# Patient Record
Sex: Female | Born: 1970 | Race: White | Hispanic: No | Marital: Married | Smoking: Current every day smoker
Health system: Southern US, Community
[De-identification: ages and names within clinical notes are randomized; demographics above are authoritative.]

## PROBLEM LIST (undated history)

## (undated) DIAGNOSIS — M25551 Pain in right hip: Secondary | ICD-10-CM

## (undated) DIAGNOSIS — E119 Type 2 diabetes mellitus without complications: Secondary | ICD-10-CM

## (undated) DIAGNOSIS — F32A Depression, unspecified: Secondary | ICD-10-CM

## (undated) DIAGNOSIS — N3090 Cystitis, unspecified without hematuria: Secondary | ICD-10-CM

## (undated) DIAGNOSIS — M1990 Unspecified osteoarthritis, unspecified site: Secondary | ICD-10-CM

## (undated) DIAGNOSIS — Z8719 Personal history of other diseases of the digestive system: Secondary | ICD-10-CM

## (undated) DIAGNOSIS — IMO0002 Reserved for concepts with insufficient information to code with codable children: Secondary | ICD-10-CM

## (undated) DIAGNOSIS — K760 Fatty (change of) liver, not elsewhere classified: Secondary | ICD-10-CM

## (undated) DIAGNOSIS — F419 Anxiety disorder, unspecified: Secondary | ICD-10-CM

## (undated) DIAGNOSIS — N189 Chronic kidney disease, unspecified: Secondary | ICD-10-CM

## (undated) DIAGNOSIS — G709 Myoneural disorder, unspecified: Secondary | ICD-10-CM

## (undated) DIAGNOSIS — G4700 Insomnia, unspecified: Secondary | ICD-10-CM

## (undated) DIAGNOSIS — R519 Headache, unspecified: Secondary | ICD-10-CM

## (undated) DIAGNOSIS — K3184 Gastroparesis: Secondary | ICD-10-CM

## (undated) DIAGNOSIS — G8929 Other chronic pain: Secondary | ICD-10-CM

## (undated) DIAGNOSIS — R110 Nausea: Secondary | ICD-10-CM

## (undated) DIAGNOSIS — E2740 Unspecified adrenocortical insufficiency: Secondary | ICD-10-CM

## (undated) DIAGNOSIS — B9681 Helicobacter pylori [H. pylori] as the cause of diseases classified elsewhere: Secondary | ICD-10-CM

## (undated) DIAGNOSIS — F329 Major depressive disorder, single episode, unspecified: Secondary | ICD-10-CM

## (undated) DIAGNOSIS — R51 Headache: Secondary | ICD-10-CM

## (undated) DIAGNOSIS — F418 Other specified anxiety disorders: Secondary | ICD-10-CM

## (undated) DIAGNOSIS — E1165 Type 2 diabetes mellitus with hyperglycemia: Secondary | ICD-10-CM

## (undated) DIAGNOSIS — J189 Pneumonia, unspecified organism: Secondary | ICD-10-CM

## (undated) DIAGNOSIS — K602 Anal fissure, unspecified: Secondary | ICD-10-CM

## (undated) DIAGNOSIS — M797 Fibromyalgia: Secondary | ICD-10-CM

## (undated) DIAGNOSIS — E785 Hyperlipidemia, unspecified: Secondary | ICD-10-CM

## (undated) DIAGNOSIS — I10 Essential (primary) hypertension: Secondary | ICD-10-CM

## (undated) DIAGNOSIS — K279 Peptic ulcer, site unspecified, unspecified as acute or chronic, without hemorrhage or perforation: Secondary | ICD-10-CM

## (undated) DIAGNOSIS — K8020 Calculus of gallbladder without cholecystitis without obstruction: Secondary | ICD-10-CM

## (undated) HISTORY — DX: Depression, unspecified: F32.A

## (undated) HISTORY — DX: Gastroparesis: K31.84

## (undated) HISTORY — DX: Other specified anxiety disorders: F41.8

## (undated) HISTORY — DX: Headache: R51

## (undated) HISTORY — DX: Anal fissure, unspecified: K60.2

## (undated) HISTORY — DX: Pain in right hip: M25.551

## (undated) HISTORY — DX: Reserved for concepts with insufficient information to code with codable children: IMO0002

## (undated) HISTORY — DX: Unspecified adrenocortical insufficiency: E27.40

## (undated) HISTORY — DX: Anxiety disorder, unspecified: F41.9

## (undated) HISTORY — DX: Helicobacter pylori (H. pylori) as the cause of diseases classified elsewhere: B96.81

## (undated) HISTORY — DX: Type 2 diabetes mellitus without complications: E11.9

## (undated) HISTORY — DX: Type 2 diabetes mellitus with hyperglycemia: E11.65

## (undated) HISTORY — PX: TUBAL LIGATION: SHX77

## (undated) HISTORY — DX: Major depressive disorder, single episode, unspecified: F32.9

## (undated) HISTORY — DX: Unspecified osteoarthritis, unspecified site: M19.90

## (undated) HISTORY — DX: Hyperlipidemia, unspecified: E78.5

## (undated) HISTORY — DX: Headache, unspecified: R51.9

## (undated) HISTORY — DX: Essential (primary) hypertension: I10

## (undated) HISTORY — DX: Other chronic pain: G89.29

## (undated) HISTORY — DX: Nausea: R11.0

## (undated) HISTORY — DX: Chronic kidney disease, unspecified: N18.9

## (undated) HISTORY — DX: Helicobacter pylori (H. pylori) as the cause of diseases classified elsewhere: K27.9

---

## 1998-01-31 ENCOUNTER — Ambulatory Visit (HOSPITAL_COMMUNITY): Admission: RE | Admit: 1998-01-31 | Discharge: 1998-01-31 | Payer: Self-pay | Admitting: Obstetrics and Gynecology

## 2003-01-11 ENCOUNTER — Other Ambulatory Visit: Admission: RE | Admit: 2003-01-11 | Discharge: 2003-01-11 | Payer: Self-pay | Admitting: Obstetrics and Gynecology

## 2003-01-30 ENCOUNTER — Encounter: Payer: Self-pay | Admitting: Obstetrics and Gynecology

## 2003-01-30 ENCOUNTER — Ambulatory Visit (HOSPITAL_COMMUNITY): Admission: RE | Admit: 2003-01-30 | Discharge: 2003-01-30 | Payer: Self-pay | Admitting: Obstetrics and Gynecology

## 2003-02-25 ENCOUNTER — Encounter: Admission: RE | Admit: 2003-02-25 | Discharge: 2003-05-26 | Payer: Self-pay | Admitting: Family Medicine

## 2003-12-09 ENCOUNTER — Encounter: Admission: RE | Admit: 2003-12-09 | Discharge: 2004-03-08 | Payer: Self-pay | Admitting: Family Medicine

## 2005-01-01 ENCOUNTER — Ambulatory Visit: Payer: Self-pay | Admitting: Internal Medicine

## 2005-01-14 ENCOUNTER — Ambulatory Visit: Payer: Self-pay | Admitting: Internal Medicine

## 2005-01-20 ENCOUNTER — Ambulatory Visit: Payer: Self-pay | Admitting: Family Medicine

## 2005-01-21 ENCOUNTER — Ambulatory Visit: Payer: Self-pay | Admitting: *Deleted

## 2005-01-28 ENCOUNTER — Ambulatory Visit (HOSPITAL_COMMUNITY): Admission: RE | Admit: 2005-01-28 | Discharge: 2005-01-28 | Payer: Self-pay | Admitting: Family Medicine

## 2005-01-28 ENCOUNTER — Ambulatory Visit: Payer: Self-pay | Admitting: Family Medicine

## 2005-02-12 ENCOUNTER — Ambulatory Visit: Payer: Self-pay | Admitting: Internal Medicine

## 2005-03-31 ENCOUNTER — Ambulatory Visit: Payer: Self-pay | Admitting: Internal Medicine

## 2005-05-13 ENCOUNTER — Ambulatory Visit: Payer: Self-pay | Admitting: Internal Medicine

## 2008-07-26 HISTORY — PX: DIAGNOSTIC LAPAROSCOPY: SUR761

## 2009-09-13 ENCOUNTER — Encounter: Payer: Self-pay | Admitting: Sports Medicine

## 2009-09-25 ENCOUNTER — Encounter: Admission: RE | Admit: 2009-09-25 | Discharge: 2009-09-25 | Payer: Self-pay | Admitting: Family Medicine

## 2010-01-13 ENCOUNTER — Ambulatory Visit (HOSPITAL_COMMUNITY): Admission: RE | Admit: 2010-01-13 | Discharge: 2010-01-14 | Payer: Self-pay | Admitting: Obstetrics and Gynecology

## 2010-01-13 ENCOUNTER — Encounter (INDEPENDENT_AMBULATORY_CARE_PROVIDER_SITE_OTHER): Payer: Self-pay | Admitting: Obstetrics and Gynecology

## 2010-07-26 HISTORY — PX: ABDOMINAL HYSTERECTOMY: SHX81

## 2010-08-27 NOTE — Progress Notes (Signed)
Summary: [facility]   Imported By: Marily Memos 09/18/2009 14:54:51  _____________________________________________________________________  External Attachment:    Type:   Image     Comment:   External Document

## 2010-10-11 LAB — URINE MICROSCOPIC-ADD ON

## 2010-10-11 LAB — CBC
HCT: 37.5 % (ref 36.0–46.0)
Hemoglobin: 10.5 g/dL — ABNORMAL LOW (ref 12.0–15.0)
Hemoglobin: 12.9 g/dL (ref 12.0–15.0)
MCHC: 34.4 g/dL (ref 30.0–36.0)
MCHC: 34.6 g/dL (ref 30.0–36.0)
MCV: 83.4 fL (ref 78.0–100.0)
RBC: 3.64 MIL/uL — ABNORMAL LOW (ref 3.87–5.11)
RDW: 15.2 % (ref 11.5–15.5)

## 2010-10-11 LAB — BASIC METABOLIC PANEL
CO2: 25 mEq/L (ref 19–32)
Calcium: 8.8 mg/dL (ref 8.4–10.5)
Glucose, Bld: 306 mg/dL — ABNORMAL HIGH (ref 70–99)
Potassium: 3.4 mEq/L — ABNORMAL LOW (ref 3.5–5.1)
Sodium: 134 mEq/L — ABNORMAL LOW (ref 135–145)

## 2010-10-11 LAB — URINALYSIS, ROUTINE W REFLEX MICROSCOPIC
Bilirubin Urine: NEGATIVE
Nitrite: NEGATIVE
Specific Gravity, Urine: 1.015 (ref 1.005–1.030)
Urobilinogen, UA: 0.2 mg/dL (ref 0.0–1.0)

## 2010-10-11 LAB — GLUCOSE, CAPILLARY
Glucose-Capillary: 184 mg/dL — ABNORMAL HIGH (ref 70–99)
Glucose-Capillary: 254 mg/dL — ABNORMAL HIGH (ref 70–99)

## 2011-07-27 HISTORY — PX: GALLBLADDER SURGERY: SHX652

## 2011-08-02 ENCOUNTER — Ambulatory Visit (INDEPENDENT_AMBULATORY_CARE_PROVIDER_SITE_OTHER): Payer: Managed Care, Other (non HMO)

## 2011-08-02 DIAGNOSIS — R05 Cough: Secondary | ICD-10-CM

## 2011-08-02 DIAGNOSIS — J069 Acute upper respiratory infection, unspecified: Secondary | ICD-10-CM

## 2011-08-02 DIAGNOSIS — R059 Cough, unspecified: Secondary | ICD-10-CM

## 2011-09-05 ENCOUNTER — Ambulatory Visit (INDEPENDENT_AMBULATORY_CARE_PROVIDER_SITE_OTHER): Payer: Managed Care, Other (non HMO) | Admitting: Physician Assistant

## 2011-09-05 ENCOUNTER — Encounter: Payer: Self-pay | Admitting: Physician Assistant

## 2011-09-05 DIAGNOSIS — M25551 Pain in right hip: Secondary | ICD-10-CM | POA: Insufficient documentation

## 2011-09-05 DIAGNOSIS — F411 Generalized anxiety disorder: Secondary | ICD-10-CM

## 2011-09-05 DIAGNOSIS — M79676 Pain in unspecified toe(s): Secondary | ICD-10-CM

## 2011-09-05 DIAGNOSIS — E1165 Type 2 diabetes mellitus with hyperglycemia: Secondary | ICD-10-CM

## 2011-09-05 DIAGNOSIS — IMO0002 Reserved for concepts with insufficient information to code with codable children: Secondary | ICD-10-CM | POA: Insufficient documentation

## 2011-09-05 DIAGNOSIS — M79609 Pain in unspecified limb: Secondary | ICD-10-CM

## 2011-09-05 DIAGNOSIS — IMO0001 Reserved for inherently not codable concepts without codable children: Secondary | ICD-10-CM

## 2011-09-05 DIAGNOSIS — F418 Other specified anxiety disorders: Secondary | ICD-10-CM

## 2011-09-05 LAB — COMPREHENSIVE METABOLIC PANEL
ALT: 10 U/L (ref 0–35)
AST: 14 U/L (ref 0–37)
CO2: 24 mEq/L (ref 19–32)
Creat: 0.57 mg/dL (ref 0.50–1.10)
Total Bilirubin: 0.5 mg/dL (ref 0.3–1.2)

## 2011-09-05 LAB — LIPID PANEL
HDL: 47 mg/dL (ref >39)
LDL Cholesterol: 107 mg/dL — ABNORMAL HIGH (ref 0–99)
Triglycerides: 146 mg/dL (ref <150)
VLDL: 29 mg/dL (ref 0–40)

## 2011-09-05 MED ORDER — DESVENLAFAXINE SUCCINATE ER 100 MG PO TB24: 100.0000 mg | ORAL_TABLET | Freq: Every day | ORAL | Status: DC

## 2011-09-05 MED ORDER — DICLOFENAC SODIUM 75 MG PO TBEC: 75.0000 mg | DELAYED_RELEASE_TABLET | Freq: Two times a day (BID) | ORAL | Status: DC

## 2011-09-05 MED ORDER — CLONAZEPAM 0.5 MG PO TABS: ORAL_TABLET | ORAL | Status: DC

## 2011-09-05 MED ORDER — METFORMIN HCL 1000 MG PO TABS: 1000.0000 mg | ORAL_TABLET | Freq: Two times a day (BID) | ORAL | Status: DC

## 2011-09-05 MED ORDER — GLIPIZIDE ER 10 MG PO TB24: 10.0000 mg | ORAL_TABLET | Freq: Every day | ORAL | Status: DC

## 2011-09-05 NOTE — Progress Notes (Signed)
  Subjective:    Patient ID: Meghan Park, female    DOB: [birth year], 41 y.o.   MRN: [id number]  HPI Pt presents with multiple problems     - recheck/med refill for DM.  Tolerating glucophage and glucotrol well.  Has increased exercise slightly as her hip pain has allowed her to, trying to watch her diet - does not regularly check her glucose at home      -R toe pain/strange looking toenail - has noticed it for several months - kicks things a lot at work so ? Whether from trauma but worried about fungus - pain only when she hits her toe      -R hip pain - wakes her up at night - mobic did not help with the pain, motrin barely gives her any relief      -depression and anxiety - she questions whether her control with Prestiq is as good as it has been - She has been on Prestiq 100mg  for about 6 months and felt like she was doing good until a few weeks ago where she is having increased anxiety with some panic types feelings with irritability and sob.  She is the happiest she has been in a long time and feels like she has no other big stressors.  She has also noticed increased in depression with increase crying spells - this increase is not everyday and she ? Whether it is related to insomnia and lack of good sleep  Review of Systems  Constitutional: Negative for appetite change.  Cardiovascular: Negative for chest pain.  Psychiatric/Behavioral: Positive for sleep disturbance. The patient is nervous/anxious.        Objective:   Physical Exam  Constitutional: She appears well-developed and well-nourished.  HENT:  Head: Normocephalic and atraumatic.  Right Ear: External ear normal.  Left Ear: External ear normal.  Cardiovascular: Normal rate and regular rhythm.   Pulmonary/Chest: Effort normal and breath sounds normal.  Skin: Skin is warm and dry. No cyanosis. Nails show no clubbing.             Assessment & Plan:   1. DM (diabetes mellitus), type 2, uncontrolled  POCT glycosylated  hemoglobin (Hb A1C), POCT glucose (manual entry), Lipid panel, Comprehensive metabolic panel, glipiZIDE (GLUCOTROL XL) 10 MG 24 hr tablet, metFORMIN (GLUCOPHAGE) 1000 MG tablet  2. Depression with anxiety  desvenlafaxine (PRISTIQ) 100 MG 24 hr tablet, clonazePAM (KLONOPIN) 0.5 MG tablet  3. Toe pain    4. Hip pain, right  diclofenac (VOLTAREN) 75 MG EC tablet     D/w pt following points 1-DM - significant improvement in HgA1C - continue current regimen, monitor diet and increase exercise for weight lose 2-Toe pain - believe related to trauma but pt is to monitor and if the discoloration gets closer to the nail matrix pt may want to consider fungal treatment 3-hip pain - trial of voltaren for pain relief 4-depression with anxiety - ?related to insomnia - trial of Klonopin qd -   Pt to recheck in 3months for DM, 1 month for depression if symptoms not improved with klonopin.

## 2011-09-05 NOTE — Progress Notes (Signed)
  Subjective:    Patient ID: Meghan Park, female    DOB: [birth year], 40 y.o.   MRN: [id number]  HPI    Review of Systems     Objective:   Physical Exam        Assessment & Plan:

## 2011-09-16 ENCOUNTER — Telehealth: Payer: Self-pay

## 2011-09-16 NOTE — Telephone Encounter (Signed)
Pt states she has now been on the Klonopin for two weeks now and is feeling very tearful and no better.  She would like to know if she could maybe try something else or what she needs to do.

## 2011-09-16 NOTE — Telephone Encounter (Signed)
.  [facility] PT WAS TO CALL BACK IF NO BETTER. PLEASE CALL [phone]

## 2011-09-17 NOTE — Telephone Encounter (Signed)
Would you let her know that Meghan Park should address this and that she is here tomorrow?

## 2011-09-18 NOTE — Telephone Encounter (Signed)
Patient notified.  Does not want to try therapy at this point, will try increasing Klonipin and recheck if no improvement.

## 2011-09-18 NOTE — Telephone Encounter (Signed)
Pt can try to Increase Klonopin to 2 pills qd and if that does not help we may need to try and other antidepressant because we cannot increase Pristiq.  If pt interested in counseling that may help her figure out if there is a cause for the change even though she feels better now then she has in a long time.

## 2011-09-27 ENCOUNTER — Ambulatory Visit (INDEPENDENT_AMBULATORY_CARE_PROVIDER_SITE_OTHER): Payer: Managed Care, Other (non HMO)

## 2011-09-28 ENCOUNTER — Ambulatory Visit (INDEPENDENT_AMBULATORY_CARE_PROVIDER_SITE_OTHER): Payer: Managed Care, Other (non HMO) | Admitting: Physician Assistant

## 2011-09-28 VITALS — BP 119/81 | HR 116 | Temp 99.0°F | Resp 16 | Ht 63.0 in | Wt 208.0 lb

## 2011-09-28 DIAGNOSIS — F341 Dysthymic disorder: Secondary | ICD-10-CM

## 2011-09-28 DIAGNOSIS — G4700 Insomnia, unspecified: Secondary | ICD-10-CM

## 2011-09-28 DIAGNOSIS — F418 Other specified anxiety disorders: Secondary | ICD-10-CM

## 2011-09-28 MED ORDER — SERTRALINE HCL 50 MG PO TABS: ORAL_TABLET | ORAL | Status: DC

## 2011-09-28 MED ORDER — CLONAZEPAM 1 MG PO TABS: 1.0000 mg | ORAL_TABLET | Freq: Every evening | ORAL | Status: DC | PRN

## 2011-09-28 MED ORDER — DESVENLAFAXINE SUCCINATE ER 50 MG PO TB24: 100.0000 mg | ORAL_TABLET | Freq: Every day | ORAL | Status: DC

## 2011-09-28 NOTE — Progress Notes (Signed)
  Subjective:    Patient ID: Meghan Park, female    DOB: 1971-03-30, 41 y.o.   MRN: [id number]  HPI  Pt presents to clinic with concerns that her Pristiq is not working well anymore.  She has been on it for >1year and it has been working well until 1 month ago when she started to notice increased anxiety and teary.  She feels like she is the happiest in her life but cries all the time and sometimes for no reasons.  Yesterday she was at work and her computer messed up and she sat and cried, she tried to come to our office last pm and in the lobby and just started to cry and ended up leaving because of a panic attack.  She has always had problems with anxiety but never like this.  The Klonopin has helped with sleep but even with sleep her crying and anxiety has not improved.  She cannot pinpoint what caused the change.  She has been talking with her pastor at church and that helps but she feels like she needs a medication change.  She has been on Lexapro in past and it worked well for 4-5 years and then just stopped working.  Review of Systems  Psychiatric/Behavioral: Positive for sleep disturbance. Negative for suicidal ideas. The patient is nervous/anxious.        Objective:   Physical Exam  Constitutional: She is oriented to person, place, and time. She appears well-developed and well-nourished.  HENT:  Head: Normocephalic and atraumatic.  Right Ear: External ear normal.  Left Ear: External ear normal.  Eyes: Conjunctivae are normal.  Pulmonary/Chest: Effort normal.  Neurological: She is alert and oriented to person, place, and time.  Skin: Skin is warm and dry.  Psychiatric: Her speech is normal and behavior is normal. Judgment and thought content normal. Her mood appears not anxious. She exhibits a depressed mood (tearful).          Assessment & Plan:   1. Depression with anxiety  clonazePAM (KLONOPIN) 1 MG tablet, desvenlafaxine (PRISTIQ) 50 MG 24 hr tablet, sertraline (ZOLOFT)  50 MG tablet  D/w pt that we will taper off Pristiq within 10 day by Pristiq 50mg  1 po qd for 7 days then 1 po qod for the next week.  As she tapers down the Pristiq we will start Zoloft 50mg  1 po qd until off Pristiq then increase to 2 po qd.  Towards the end of her bottle she should call us with her results.  If good will stay with the same dose and recheck 1 month after that.  If some help but not good control can increase to Zoloft 150mg  and recheck 1 month after that.  If no help after 6 weeks of meds consider change to Cymbalta?  Pt understands and agrees with above plan.  Will continue Klonopin for sleep.

## 2011-11-10 ENCOUNTER — Telehealth: Payer: Self-pay

## 2011-11-10 DIAGNOSIS — F418 Other specified anxiety disorders: Secondary | ICD-10-CM

## 2011-11-10 MED ORDER — CLONAZEPAM 1 MG PO TABS: 1.0000 mg | ORAL_TABLET | Freq: Every evening | ORAL | Status: DC | PRN

## 2011-11-10 MED ORDER — SERTRALINE HCL 50 MG PO TABS: ORAL_TABLET | ORAL | Status: DC

## 2011-11-10 NOTE — Telephone Encounter (Signed)
Pt would like to have a refill on Zoloft and Klonopin. Pharmacy:  [facility]

## 2011-11-10 NOTE — Telephone Encounter (Signed)
Called in klonopin and let pt know that both rx's have been taken care of.

## 2011-11-10 NOTE — Telephone Encounter (Signed)
Rx for Zoloft sent to pharmacy.  Please phone in Klonopin as written.

## 2011-11-20 ENCOUNTER — Ambulatory Visit (INDEPENDENT_AMBULATORY_CARE_PROVIDER_SITE_OTHER): Payer: Managed Care, Other (non HMO) | Admitting: Family Medicine

## 2011-11-20 VITALS — BP 113/78 | HR 103 | Temp 98.4°F | Resp 20 | Ht 63.0 in | Wt 218.0 lb

## 2011-11-20 DIAGNOSIS — J329 Chronic sinusitis, unspecified: Secondary | ICD-10-CM

## 2011-11-20 DIAGNOSIS — R609 Edema, unspecified: Secondary | ICD-10-CM

## 2011-11-20 DIAGNOSIS — J45909 Unspecified asthma, uncomplicated: Secondary | ICD-10-CM

## 2011-11-20 DIAGNOSIS — E119 Type 2 diabetes mellitus without complications: Secondary | ICD-10-CM

## 2011-11-20 DIAGNOSIS — R600 Localized edema: Secondary | ICD-10-CM

## 2011-11-20 MED ORDER — MOMETASONE FURO-FORMOTEROL FUM 200-5 MCG/ACT IN AERO: 1.0000 | INHALATION_SPRAY | Freq: Two times a day (BID) | RESPIRATORY_TRACT | Status: DC

## 2011-11-20 MED ORDER — AMOXICILLIN 875 MG PO TABS: 875.0000 mg | ORAL_TABLET | Freq: Two times a day (BID) | ORAL | Status: AC

## 2011-11-20 MED ORDER — FLUCONAZOLE 150 MG PO TABS: 150.0000 mg | ORAL_TABLET | Freq: Once | ORAL | Status: AC

## 2011-11-20 MED ORDER — FLUTICASONE PROPIONATE 50 MCG/ACT NA SUSP: 2.0000 | Freq: Every day | NASAL | Status: DC

## 2011-11-20 NOTE — Progress Notes (Signed)
  Subjective:    Patient ID: Meghan Park, female    DOB: 1970-09-28, 41 y.o.   MRN: [id number]  HPI  Patient presents with several concerns.  Complains of facial pressure, PND and nocturnal cough. Experiences chest tightness, DOE without wheezing and again symptoms are worse at night. No history of seasonal allergies or allergy induced asthma.  Headache across forehead and at times associated with nausea.  Recent leg swelling (L) > (R) LE;  No significant increase in Na+; No history of of neuropathy related to DM. Having to seat more at work  PMH / Type 2 DM- Last A1C 7.0.  Tobacco -1/2 pack/ day X 20 years  Review of Systems  Constitutional: Positive for fatigue. Negative for fever and chills.  HENT: Positive for congestion and sneezing.   Eyes: Negative for itching.  Respiratory: Positive for cough and shortness of breath. Negative for wheezing.        Objective:   Physical Exam  Constitutional: She appears well-developed.  HENT:  Nose: Mucosal edema: turbinates with blue/purple hue and very edematous.  Eyes: Conjunctivae are normal.  Neck: Neck supple.  Cardiovascular: Normal rate, regular rhythm and normal heart sounds.   Pulmonary/Chest:       Prolong expiratory phase associated with course BS  Lymphadenopathy:    Cervical adenopathy: shoddy anterior nodes.  Neurological: She is alert.  Skin: Skin is warm.       Trace edema (B) LE; early neuropathic changes          Assessment & Plan:   1. Sinusitis   2. RAD (reactive airway disease)   3. DM type 2 (diabetes mellitus, type 2)   4. Leg edema    See medications on VS. Monitor BS during illness and call with any elevated BS readings. Monitor sodium in diet.

## 2011-12-11 ENCOUNTER — Ambulatory Visit (INDEPENDENT_AMBULATORY_CARE_PROVIDER_SITE_OTHER): Payer: Managed Care, Other (non HMO) | Admitting: Family Medicine

## 2011-12-11 VITALS — BP 123/83 | HR 105 | Temp 98.8°F | Resp 18 | Ht 63.5 in | Wt 213.2 lb

## 2011-12-11 DIAGNOSIS — E1165 Type 2 diabetes mellitus with hyperglycemia: Secondary | ICD-10-CM

## 2011-12-11 DIAGNOSIS — L281 Prurigo nodularis: Secondary | ICD-10-CM

## 2011-12-11 DIAGNOSIS — IMO0002 Reserved for concepts with insufficient information to code with codable children: Secondary | ICD-10-CM

## 2011-12-11 DIAGNOSIS — F418 Other specified anxiety disorders: Secondary | ICD-10-CM

## 2011-12-11 DIAGNOSIS — R17 Unspecified jaundice: Secondary | ICD-10-CM

## 2011-12-11 DIAGNOSIS — F341 Dysthymic disorder: Secondary | ICD-10-CM

## 2011-12-11 DIAGNOSIS — IMO0001 Reserved for inherently not codable concepts without codable children: Secondary | ICD-10-CM

## 2011-12-11 LAB — POCT GLYCOSYLATED HEMOGLOBIN (HGB A1C): Hemoglobin A1C: 7.3

## 2011-12-11 MED ORDER — SERTRALINE HCL 50 MG PO TABS: 100.0000 mg | ORAL_TABLET | Freq: Every day | ORAL | Status: DC

## 2011-12-11 MED ORDER — CLONAZEPAM 1 MG PO TABS: 1.0000 mg | ORAL_TABLET | Freq: Every evening | ORAL | Status: DC | PRN

## 2011-12-11 MED ORDER — SERTRALINE HCL 100 MG PO TABS: 100.0000 mg | ORAL_TABLET | Freq: Every day | ORAL | Status: DC

## 2011-12-11 MED ORDER — METFORMIN HCL 1000 MG PO TABS: 1000.0000 mg | ORAL_TABLET | Freq: Two times a day (BID) | ORAL | Status: DC

## 2011-12-11 NOTE — Progress Notes (Signed)
Patient Name: Meghan Park Date of Birth: [birth year] Medical Record Number: [id number] Gender: female Date of Encounter: 12/11/2011  History of Present Illness:  Meghan Park is a 41 y.o. very pleasant female patient who presents with the following:  Here today for medication refills and also to discuss "sores" on her back which she is concerned about.  She admits that she chronically picks at some areas in her back and behind her left ear.  One of the spots feels a little bit sore today.  She has no fever or other symptoms, and is due for an A1c check today  Patient Active Problem List  Diagnoses  . Anxiety associated with depression  . DM (diabetes mellitus), type 2, uncontrolled  . Hip pain, right   Past Medical History  Diagnosis Date  . DM (diabetes mellitus), type 2, uncontrolled   . Anxiety associated with depression   . Hip pain, right    Past Surgical History  Procedure Date  . Cesarean section     x2  . Abdominal hysterectomy 2012   History  Substance Use Topics  . Smoking status: Current Everyday Smoker -- 0.5 packs/day for 20 years    Types: Cigarettes  . Smokeless tobacco: Not on file  . Alcohol Use: No   Family History  Problem Relation Age of Onset  . Anxiety disorder Mother   . Arthritis Father   . Heart disease Maternal Grandfather    No Known Allergies  Medication list has been reviewed and updated.  Review of Systems: As per HPI- otherwise negative.   Physical Examination: Filed Vitals:   12/11/11 0820  BP: 123/83  Pulse: 105  Temp: 98.8 F (37.1 C)  TempSrc: Oral  Resp: 18  Height: 5' 3.5" (1.613 m)  Weight: 213 lb 3.2 oz (96.707 kg)    Body mass index is 37.17 kg/(m^2).  GEN: WDWN, NAD, Non-toxic, A & O x 3, obese HEENT: Atraumatic, Normocephalic. Neck supple. No masses, No LAD.  TM, oropharynx wnl Ears and Nose: No external deformity. CV: RRR, No M/G/R. No JVD. No thrill. No extra heart sounds. PULM: CTA B, no wheezes,  crackles, rhonchi. No retractions. No resp. distress. No accessory muscle use. EXTR: No c/c/e NEURO Normal gait.  PSYCH: Normally interactive. Conversant. Not depressed or anxious appearing.  Calm demeanor.  There are several "picked" areas on the upper part of her back, and one underneath her right earlobe.  They do not appear to be in infected and are in various stages of picking and healing  Results for orders placed in visit on 12/11/11  POCT GLYCOSYLATED HEMOGLOBIN (HGB A1C)      Component Value Range   Hemoglobin A1C 7.3     Assessment and Plan: 1. DM (diabetes mellitus), type 2, uncontrolled  POCT glycosylated hemoglobin (Hb A1C), metFORMIN (GLUCOPHAGE) 1000 MG tablet  2. Depression with anxiety  clonazePAM (KLONOPIN) 1 MG tablet, sertraline (ZOLOFT) 50 MG tablet  3. Picker's nodules    DM control is ok- keep up medications, exercise, and recheck in 3 or 4 months.  Encouraged her to try and stop picking at her skin, as otherwise she will not heal.  She understands this and is trying.  Reassured her that her sores do not appear to be infected. Refilled her klonopin and zoloft.  We will change her to one zoloft 100mg  daily as she has been taking #2 of the 50mg .  Follow- up if getting worse or not healing.

## 2012-01-10 ENCOUNTER — Other Ambulatory Visit: Payer: Self-pay | Admitting: Physician Assistant

## 2012-02-17 ENCOUNTER — Ambulatory Visit: Payer: Managed Care, Other (non HMO)

## 2012-02-17 ENCOUNTER — Ambulatory Visit (INDEPENDENT_AMBULATORY_CARE_PROVIDER_SITE_OTHER): Payer: Managed Care, Other (non HMO) | Admitting: Family Medicine

## 2012-02-17 VITALS — BP 134/74 | HR 116 | Temp 97.8°F | Resp 16 | Ht 63.5 in | Wt 212.8 lb

## 2012-02-17 DIAGNOSIS — K5900 Constipation, unspecified: Secondary | ICD-10-CM

## 2012-02-17 DIAGNOSIS — M2550 Pain in unspecified joint: Secondary | ICD-10-CM

## 2012-02-17 DIAGNOSIS — M25551 Pain in right hip: Secondary | ICD-10-CM

## 2012-02-17 DIAGNOSIS — M25559 Pain in unspecified hip: Secondary | ICD-10-CM

## 2012-02-17 DIAGNOSIS — E119 Type 2 diabetes mellitus without complications: Secondary | ICD-10-CM

## 2012-02-17 DIAGNOSIS — R197 Diarrhea, unspecified: Secondary | ICD-10-CM

## 2012-02-17 LAB — POCT CBC
HCT, POC: 40.1 % (ref 37.7–47.9)
Hemoglobin: 12.3 g/dL (ref 12.2–16.2)
MCH, POC: 27.2 pg (ref 27–31.2)
MCV: 88.5 fL (ref 80–97)
RBC: 4.53 M/uL (ref 4.04–5.48)
WBC: 9 10*3/uL (ref 4.6–10.2)

## 2012-02-17 LAB — COMPREHENSIVE METABOLIC PANEL
Albumin: 3.9 g/dL (ref 3.5–5.2)
Alkaline Phosphatase: 80 U/L (ref 39–117)
CO2: 24 mEq/L (ref 19–32)
Chloride: 103 mEq/L (ref 96–112)
Glucose, Bld: 146 mg/dL — ABNORMAL HIGH (ref 70–99)
Potassium: 3.8 mEq/L (ref 3.5–5.3)
Sodium: 136 mEq/L (ref 135–145)
Total Protein: 6.9 g/dL (ref 6.0–8.3)

## 2012-02-17 LAB — POCT SEDIMENTATION RATE: POCT SED RATE: 52 mm/hr — AB (ref 0–22)

## 2012-02-17 MED ORDER — DICYCLOMINE HCL 20 MG PO TABS: 20.0000 mg | ORAL_TABLET | Freq: Four times a day (QID) | ORAL | Status: DC

## 2012-02-17 MED ORDER — DICLOFENAC SODIUM 75 MG PO TBEC: 75.0000 mg | DELAYED_RELEASE_TABLET | Freq: Two times a day (BID) | ORAL | Status: DC

## 2012-02-17 NOTE — Progress Notes (Signed)
[facility] [address] [place] [address] [phone]  Date:  02/17/2012   Name:  Meghan Park   DOB:  1971/03/05   MRN:  [id number]  PCP:  No primary provider on file.    Chief Complaint: Abdominal Pain and Arthritis   History of Present Illness:  Meghan Park is a 41 y.o. very pleasant female patient who presents with the following:  "It's my stomach.  I've been ignoring it and I can't ignore it any more."   She has noted intermittent constipation and then diarrhea. She had this problem prior to her hysterectomy about 2 years ago- her symptoms then improved, but got worse again about 1 month ago.  She has had a couple of stool accidents.    The constipation will last 1 or 2 days, but she is afraid to treat herself due to the subsequent diarrhea.  She notes nausea, and has had vomited a couple of times.   The nausea is not related to eating; it can come and go.    She has had these symptoms on and off during her life. However, she has now started having stool accidents and has woken at night to have a BM urgently a few times.   Type of food eaten does not seem to matter.    No change in weight.  She noted a little blood on TP a couple of times after constipation episodes- however this seemed due to a fissure.    She has noted more pain in her right hip and in both ankles for the last few months. She is using voltaren once daily for this.  The voltaren helps and she would like more.  She is worried about an autoimmune type disorder  Knows that she has not been keeping up with her exercise and diet as well as she should be.    Patient Active Problem List  Diagnosis  . Anxiety associated with depression  . DM (diabetes mellitus), type 2, uncontrolled  . Hip pain, right    Past Medical History  Diagnosis Date  . DM (diabetes mellitus), type 2, uncontrolled   . Anxiety associated with depression   . Hip pain, right     Past Surgical History    Procedure Date  . Cesarean section     x2  . Abdominal hysterectomy 2012    History  Substance Use Topics  . Smoking status: Current Everyday Smoker -- 0.5 packs/day for 20 years    Types: Cigarettes  . Smokeless tobacco: Not on file  . Alcohol Use: No    Family History  Problem Relation Age of Onset  . Anxiety disorder Mother   . Arthritis Father   . Heart disease Maternal Grandfather     No Known Allergies  Medication list has been reviewed and updated.  Current Outpatient Prescriptions on File Prior to Visit  Medication Sig Dispense Refill  . clonazePAM (KLONOPIN) 1 MG tablet Take 1 tablet (1 mg total) by mouth at bedtime as needed for anxiety. 1/2-1 po qd for anixety  30 tablet  2  . diclofenac (VOLTAREN) 75 MG EC tablet Take 1 tablet (75 mg total) by mouth 2 (two) times daily.  60 tablet  2  . glipiZIDE (GLUCOTROL XL) 10 MG 24 hr tablet TAKE 1 TABLET BY MOUTH DAILY  30 tablet  1  . metFORMIN (GLUCOPHAGE) 1000 MG tablet Take 1 tablet (1,000 mg total) by mouth 2 (two) times daily  with a meal.  60 tablet  6  . sertraline (ZOLOFT) 100 MG tablet Take 1 tablet (100 mg total) by mouth daily.  30 tablet  11  . desvenlafaxine (PRISTIQ) 50 MG 24 hr tablet Take 2 tablets (100 mg total) by mouth daily.  14 tablet  0  . fluticasone (FLONASE) 50 MCG/ACT nasal spray Place 2 sprays into the nose daily.  1 g  6  . Mometasone Furo-Formoterol Fum 200-5 MCG/ACT AERO Inhale 1 puff into the lungs 2 (two) times daily.  1 Inhaler  1    Review of Systems:  As per HPI- otherwise negative.   Physical Examination: Filed Vitals:   02/17/12 0743  BP: 134/74  Pulse: 116  Temp: 97.8 F (36.6 C)  Resp: 16   Filed Vitals:   02/17/12 0743  Height: 5' 3.5" (1.613 m)  Weight: 212 lb 12.8 oz (96.525 kg)   Body mass index is 37.10 kg/(m^2). Ideal Body Weight: Weight in (lb) to have BMI = 25: 143.1   GEN: WDWN, NAD, Non-toxic, A & O x 3, obese HEENT: Atraumatic, Normocephalic. Neck supple.  No masses, No LAD.  Tm and oropharynx wnl Ears and Nose: No external deformity. CV: RRR, No M/G/R. No JVD. No thrill. No extra heart sounds. PULM: CTA B, no wheezes, crackles, rhonchi. No retractions. No resp. distress. No accessory muscle use. ABD: S, NT, ND, +BS. No rebound. No HSM. The chronic pickers nodules on her back look better- she has been better at leaving them alone EXTR: No c/c/e. Bilateral ankles normal, right hip with normal ROM and no acute tenderness NEURO Normal gait.  PSYCH: Normally interactive. Conversant. Not depressed or anxious appearing.  Calm demeanor.  Rectal exam: normal, no stool palpated, no blood visible  [facility] reading (PRIMARY) by  Dr. Patsy Lager. Abdomen:  Normal series Hip, right: mild degenerative change  ABDOMEN - 2 VIEW  Comparison: None.  Findings: No free air underneath the hemidiaphragms. Normal bowel gas pattern. No dilated loops of large or small bowel. Stool and bowel gas extends to the rectosigmoid.  IMPRESSION: Normal two-view abdomen.  RIGHT HIP - COMPLETE 2+ VIEW  Comparison: None.  Findings: Right hip joint space preserved. Hip joint spaces appear symmetric. Pelvic rings intact. Calcification is present at the greater trochanter, small calcification is present at the greater trochanter compatible with gluteal calcific tendonitis. There is no fracture. Alignment of the hip joints is anatomic.  IMPRESSION: Calcific tendonitis of the right hip.  Results for orders placed in visit on 02/17/12  POCT CBC      Component Value Range   WBC 9.0  4.6 - 10.2 K/uL   Lymph, poc 2.3  0.6 - 3.4   POC LYMPH PERCENT 26.1  10 - 50 %L   MID (cbc) 0.5  0 - 0.9   POC MID % 6.1  0 - 12 %M   POC Granulocyte 6.1  2 - 6.9   Granulocyte percent 67.8  37 - 80 %G   RBC 4.53  4.04 - 5.48 M/uL   Hemoglobin 12.3  12.2 - 16.2 g/dL   HCT, POC 11.9  14.7 - 47.9 %   MCV 88.5  80 - 97 fL   MCH, POC 27.2  27 - 31.2 pg   MCHC 30.7 (*) 31.8 - 35.4 g/dL    RDW, POC 82.9     Platelet Count, POC 305  142 - 424 K/uL   MPV 7.7  0 - 99.8 fL  POCT GLYCOSYLATED HEMOGLOBIN (  HGB A1C)      Component Value Range   Hemoglobin A1C 8.1    IFOBT (OCCULT BLOOD)      Component Value Range   IFOBT Negative    POCT SEDIMENTATION RATE      Component Value Range   POCT SED RATE 52 (*) 0 - 22 mm/hr     Assessment and Plan: 1. Constipation  POCT CBC, IFOBT POC (occult bld, rslt in office), Comprehensive metabolic panel, DG Abd 2 Views  2. DM (diabetes mellitus)  POCT glycosylated hemoglobin (Hb A1C), DG Hip Complete Right  3. Diarrhea  POCT CBC, IFOBT POC (occult bld, rslt in office), Comprehensive metabolic panel, Ova and parasite screen, dicyclomine (BENTYL) 20 MG tablet  4. Joint pain  POCT SEDIMENTATION RATE, ANA  5. Hip pain, right  diclofenac (VOLTAREN) 75 MG EC tablet   Suspect that Meghan Park has IBS.  Will try treating her with bentyl while we await her other labs and stool studies.  If all her labs are negative will plan to have her see GI to rule- out any other problems.  She is aware that her DM is becoming less controlled.  She is nearly maxed out on oral therapy.  Meghan Park wants to try hard to improve her diet and exercise habits and recheck in about 2 months.   Joint pains: suspect due to OA, but await ANA- ESR is elevated.  Also will need to go over details of hip x-ray when going over labs.    Meghan Amsterdam, MD

## 2012-02-18 LAB — ANA: Anti Nuclear Antibody(ANA): NEGATIVE

## 2012-02-22 ENCOUNTER — Telehealth: Payer: Self-pay

## 2012-02-22 NOTE — Telephone Encounter (Signed)
Clarified labs with pt's husband.

## 2012-02-22 NOTE — Telephone Encounter (Signed)
Pt would like for nurse to contact her she is confused on what Dr Patsy Lager said on her voicemail

## 2012-02-24 ENCOUNTER — Encounter: Payer: Self-pay | Admitting: Family Medicine

## 2012-02-24 NOTE — Progress Notes (Signed)
Letter to pt reminding her to do this test

## 2012-02-25 LAB — OVA AND PARASITE SCREEN

## 2012-03-02 ENCOUNTER — Telehealth: Payer: Self-pay

## 2012-03-02 NOTE — Telephone Encounter (Signed)
Dr.Copland, Pt would like to let you know that her cramping in her stomach is really bad pt also is experiencing nausea. Pt would like to know what the next step is. Best# [phone]

## 2012-03-02 NOTE — Telephone Encounter (Signed)
Called and discussed with her- we arranged a GI appt for her today.  She could not make it but rescheduled for next week

## 2012-03-03 LAB — HM DIABETES EYE EXAM

## 2012-03-10 ENCOUNTER — Other Ambulatory Visit: Payer: Self-pay | Admitting: Family Medicine

## 2012-03-10 ENCOUNTER — Other Ambulatory Visit: Payer: Self-pay | Admitting: Gastroenterology

## 2012-03-10 DIAGNOSIS — R110 Nausea: Secondary | ICD-10-CM

## 2012-03-10 DIAGNOSIS — R1011 Right upper quadrant pain: Secondary | ICD-10-CM

## 2012-03-10 MED ORDER — GLIPIZIDE ER 10 MG PO TB24: 10.0000 mg | ORAL_TABLET | Freq: Every day | ORAL | Status: DC

## 2012-03-21 ENCOUNTER — Encounter (HOSPITAL_COMMUNITY)
Admission: RE | Admit: 2012-03-21 | Discharge: 2012-03-21 | Disposition: A | Discharge status: Home / Self Care | Payer: Managed Care, Other (non HMO) | Source: Ambulatory Visit | Attending: Gastroenterology | Admitting: Gastroenterology

## 2012-03-21 ENCOUNTER — Ambulatory Visit (HOSPITAL_COMMUNITY)
Admission: RE | Admit: 2012-03-21 | Discharge: 2012-03-21 | Disposition: A | Discharge status: Home / Self Care | Payer: Managed Care, Other (non HMO) | Source: Ambulatory Visit | Attending: Gastroenterology | Admitting: Gastroenterology

## 2012-03-21 DIAGNOSIS — K8020 Calculus of gallbladder without cholecystitis without obstruction: Secondary | ICD-10-CM | POA: Insufficient documentation

## 2012-03-21 DIAGNOSIS — R1011 Right upper quadrant pain: Secondary | ICD-10-CM

## 2012-03-21 DIAGNOSIS — R110 Nausea: Secondary | ICD-10-CM

## 2012-03-21 DIAGNOSIS — K7689 Other specified diseases of liver: Secondary | ICD-10-CM | POA: Insufficient documentation

## 2012-03-28 ENCOUNTER — Encounter (INDEPENDENT_AMBULATORY_CARE_PROVIDER_SITE_OTHER): Payer: Self-pay | Admitting: General Surgery

## 2012-03-28 ENCOUNTER — Ambulatory Visit (INDEPENDENT_AMBULATORY_CARE_PROVIDER_SITE_OTHER): Payer: Managed Care, Other (non HMO) | Admitting: General Surgery

## 2012-03-28 VITALS — BP 126/80 | HR 116 | Temp 98.0°F | Resp 18 | Ht 62.0 in | Wt 210.0 lb

## 2012-03-28 DIAGNOSIS — K8010 Calculus of gallbladder with chronic cholecystitis without obstruction: Secondary | ICD-10-CM

## 2012-03-28 NOTE — Patient Instructions (Signed)

## 2012-03-28 NOTE — Progress Notes (Signed)
Chief Complaint  Patient presents with  . Pre-op Exam    eval GB    HISTORY: Patient is a 41 year old female with his perineal right upper quadrant and epigastric pain. She has been having on and off for around 2 months. It has increased in frequency and severity her last 3 weeks. She has tried seeing if it  is with specific types of foods but has not been able to tell what makes the pain worse.  The pain episodes last between 15 and 45 minutes. They do get better on their own.  She also has had fecal urgency and some diarrhea. Dr. Loreta Ave has been working this up. Her father has a history of gallbladder disease and required a cholecystectomy.  Past Medical History  Diagnosis Date  . DM (diabetes mellitus), type 2, uncontrolled   . Anxiety associated with depression   . Hip pain, right     Past Surgical History  Procedure Date  . Cesarean section     x2  . Abdominal hysterectomy 2012    Current Outpatient Prescriptions  Medication Sig Dispense Refill  . clonazePAM (KLONOPIN) 1 MG tablet TAKE 1 TABLET BY MOUTH AT BEDTIME AS NEEDED FOR ANXIETY. TAKE  1/2 TO 1 TABLET EVERY DAY AS NEEDED FOR ANIXETY  30 tablet  0  . diclofenac (VOLTAREN) 75 MG EC tablet Take 1 tablet (75 mg total) by mouth 2 (two) times daily.  60 tablet  3  . dicyclomine (BENTYL) 20 MG tablet Take 1 tablet (20 mg total) by mouth every 6 (six) hours.  90 tablet  2  . glipiZIDE (GLUCOTROL XL) 10 MG 24 hr tablet Take 1 tablet (10 mg total) by mouth daily.  30 tablet  1  . glucose blood test strip 1 each by Other route as needed. Use as instructed      . metFORMIN (GLUCOPHAGE) 1000 MG tablet Take 1 tablet (1,000 mg total) by mouth 2 (two) times daily with a meal.  60 tablet  6  . sertraline (ZOLOFT) 100 MG tablet Take 1 tablet (100 mg total) by mouth daily.  30 tablet  11  . desvenlafaxine (PRISTIQ) 50 MG 24 hr tablet Take 2 tablets (100 mg total) by mouth daily.  14 tablet  0  . fluticasone (FLONASE) 50 MCG/ACT nasal spray  Place 2 sprays into the nose daily.  1 g  6  . Mometasone Furo-Formoterol Fum 200-5 MCG/ACT AERO Inhale 1 puff into the lungs 2 (two) times daily.  1 Inhaler  1     No Known Allergies   Family History  Problem Relation Age of Onset  . Anxiety disorder Mother   . Arthritis Father   . Heart disease Maternal Grandfather     History   Social History  . Marital Status: Married    Spouse Name: N/A    Number of Children: N/A  . Years of Education: N/A   Social History Main Topics  . Smoking status: Current Everyday Smoker -- 0.5 packs/day for 20 years    Types: Cigarettes  . Smokeless tobacco: None  . Alcohol Use: No  . Drug Use: No  . Sexually Active:     REVIEW OF SYSTEMS - PERTINENT POSITIVES ONLY: 12 point review of systems negative other than HPI and PMH except for cough, palpitations, abdominal distention, constipation, diarrhea, nausea, vomiting, joint pain, headaches, weakness, rash.    EXAM: Filed Vitals:   03/28/12 1507  BP: 126/80  Pulse: 116  Temp: 98 F (36.7  C)  Resp: 18    Gen:  No acute distress.  Well nourished and well groomed.   Neurological: Alert and oriented to person, place, and time. Coordination normal.  Head: Normocephalic and atraumatic.  Eyes: Conjunctivae are normal. Pupils are equal, round, and reactive to light. No scleral icterus.  Neck: Normal range of motion. Neck supple. No tracheal deviation or thyromegaly present.  Cardiovascular: Normal rate, regular rhythm Respiratory: Effort normal.  No respiratory distress. No chest wall tenderness. GI: Soft. Bowel sounds are normal. The abdomen is soft and nontender.  There is no rebound and no guarding.  Musculoskeletal: Normal range of motion. Extremities are nontender.  Lymphadenopathy: No cervical, preauricular, postauricular or axillary adenopathy is present Skin: Skin is warm and dry. No rash noted. No diaphoresis. No erythema. No pallor. No clubbing, cyanosis, or edema.   Psychiatric:  Normal mood and affect. Behavior is normal. Judgment and thought content normal.    RADIOLOGY RESULTS: See E-Chart or I-Site for most recent results.  Images and reports are reviewed. US IMPRESSION:  Cholelithiasis. Hepatic steatosis. The tail of the pancreas is  not visible.    ASSESSMENT AND PLAN: Chronic cholecystitis with calculus Pt with chronic tenderness and episodic post prandial pain.  Classic gallbladder symptomatology.  Plan lap chole with cholangiogram.    The surgical procedure was described to the patient in detail.  The patient was given Agricultural engineer. .  I discussed the incision type and location, the location of the gallbladder, the anatomy of the bile ducts and arteries, and the typical progression of surgery.  I discussed the possibility of converting to an open operation.  I advised of the risks of bleeding, infection, damage to other structures (such as the bile duct, intestine or liver), bile leak, need for other procedures or surgeries, and post op diarrhea/constipation.  We discussed the risk of blood clot.  We discussed the recovery period and post operative restrictions.  The patient was advised against taking blood thinners the week before surgery.      Counselled to stop smoking.    Maudry Diego MD Surgical Oncology, General and Endocrine Surgery [facility]    Visit Diagnoses: 1. Chronic cholecystitis with calculus     Primary Care Physician: Abbe Amsterdam, MD

## 2012-03-28 NOTE — Assessment & Plan Note (Signed)
Pt with chronic tenderness and episodic post prandial pain.  Classic gallbladder symptomatology.  Plan lap chole with cholangiogram.    The surgical procedure was described to the patient in detail.  The patient was given Agricultural engineer. .  I discussed the incision type and location, the location of the gallbladder, the anatomy of the bile ducts and arteries, and the typical progression of surgery.  I discussed the possibility of converting to an open operation.  I advised of the risks of bleeding, infection, damage to other structures (such as the bile duct, intestine or liver), bile leak, need for other procedures or surgeries, and post op diarrhea/constipation.  We discussed the risk of blood clot.  We discussed the recovery period and post operative restrictions.  The patient was advised against taking blood thinners the week before surgery.

## 2012-03-29 ENCOUNTER — Telehealth (INDEPENDENT_AMBULATORY_CARE_PROVIDER_SITE_OTHER): Payer: Self-pay

## 2012-03-29 NOTE — Telephone Encounter (Signed)
LMOV pt's po appt.

## 2012-04-10 ENCOUNTER — Other Ambulatory Visit: Payer: Self-pay | Admitting: Family Medicine

## 2012-04-10 MED ORDER — CLONAZEPAM 1 MG PO TABS: ORAL_TABLET | ORAL | Status: DC

## 2012-04-13 ENCOUNTER — Telehealth (INDEPENDENT_AMBULATORY_CARE_PROVIDER_SITE_OTHER): Payer: Self-pay

## 2012-04-13 ENCOUNTER — Other Ambulatory Visit (INDEPENDENT_AMBULATORY_CARE_PROVIDER_SITE_OTHER): Payer: Self-pay

## 2012-04-13 ENCOUNTER — Other Ambulatory Visit (INDEPENDENT_AMBULATORY_CARE_PROVIDER_SITE_OTHER): Payer: Self-pay | Admitting: General Surgery

## 2012-04-13 DIAGNOSIS — G8918 Other acute postprocedural pain: Secondary | ICD-10-CM

## 2012-04-13 DIAGNOSIS — K8010 Calculus of gallbladder with chronic cholecystitis without obstruction: Secondary | ICD-10-CM

## 2012-04-13 HISTORY — PX: CHOLECYSTECTOMY: SHX55

## 2012-04-13 MED ORDER — OXYCODONE HCL 5 MG PO TABS: 5.0000 mg | ORAL_TABLET | ORAL | Status: DC | PRN

## 2012-04-13 NOTE — Telephone Encounter (Signed)
Patients husband called in stating the pain medicine that was given at discharge is not helping patients pain. They want to know if something else can be prescribed. I paged Dr. Donell Beers and she okayed straight oxy 5mg  1-2 q 4-6 hrs prn #80 with 0 refills. They were told it was ready at our front desk for pick up.

## 2012-04-17 ENCOUNTER — Other Ambulatory Visit: Payer: Self-pay

## 2012-04-20 ENCOUNTER — Telehealth (INDEPENDENT_AMBULATORY_CARE_PROVIDER_SITE_OTHER): Payer: Self-pay | Admitting: General Surgery

## 2012-04-20 NOTE — Telephone Encounter (Signed)
Pt's husband called for advice on pt's extremely poor appetite.  Discussed offering small amounts frequently.  Discussed sipping fluids.  He understands.

## 2012-04-22 ENCOUNTER — Inpatient Hospital Stay (HOSPITAL_COMMUNITY)
Admission: EM | Admit: 2012-04-22 | Discharge: 2012-04-25 | DRG: 445 | Disposition: A | Discharge status: Home / Self Care | Payer: Managed Care, Other (non HMO) | Attending: General Surgery | Admitting: General Surgery

## 2012-04-22 ENCOUNTER — Emergency Department (HOSPITAL_COMMUNITY): Payer: Managed Care, Other (non HMO)

## 2012-04-22 ENCOUNTER — Inpatient Hospital Stay (HOSPITAL_COMMUNITY): Payer: Managed Care, Other (non HMO)

## 2012-04-22 ENCOUNTER — Encounter (HOSPITAL_COMMUNITY): Payer: Self-pay | Admitting: *Deleted

## 2012-04-22 DIAGNOSIS — K838 Other specified diseases of biliary tract: Principal | ICD-10-CM | POA: Diagnosis present

## 2012-04-22 DIAGNOSIS — K8010 Calculus of gallbladder with chronic cholecystitis without obstruction: Secondary | ICD-10-CM | POA: Diagnosis present

## 2012-04-22 DIAGNOSIS — E1165 Type 2 diabetes mellitus with hyperglycemia: Secondary | ICD-10-CM | POA: Diagnosis present

## 2012-04-22 DIAGNOSIS — IMO0002 Reserved for concepts with insufficient information to code with codable children: Secondary | ICD-10-CM | POA: Diagnosis present

## 2012-04-22 DIAGNOSIS — Y849 Medical procedure, unspecified as the cause of abnormal reaction of the patient, or of later complication, without mention of misadventure at the time of the procedure: Secondary | ICD-10-CM | POA: Diagnosis present

## 2012-04-22 DIAGNOSIS — K668 Other specified disorders of peritoneum: Secondary | ICD-10-CM

## 2012-04-22 DIAGNOSIS — R630 Anorexia: Secondary | ICD-10-CM | POA: Diagnosis present

## 2012-04-22 DIAGNOSIS — F329 Major depressive disorder, single episode, unspecified: Secondary | ICD-10-CM | POA: Diagnosis present

## 2012-04-22 DIAGNOSIS — F411 Generalized anxiety disorder: Secondary | ICD-10-CM | POA: Diagnosis present

## 2012-04-22 DIAGNOSIS — F3289 Other specified depressive episodes: Secondary | ICD-10-CM | POA: Diagnosis present

## 2012-04-22 DIAGNOSIS — K929 Disease of digestive system, unspecified: Secondary | ICD-10-CM | POA: Diagnosis present

## 2012-04-22 DIAGNOSIS — F17200 Nicotine dependence, unspecified, uncomplicated: Secondary | ICD-10-CM | POA: Diagnosis present

## 2012-04-22 DIAGNOSIS — M25559 Pain in unspecified hip: Secondary | ICD-10-CM | POA: Diagnosis present

## 2012-04-22 LAB — CBC WITH DIFFERENTIAL/PLATELET
Basophils Absolute: 0.1 10*3/uL (ref 0.0–0.1)
HCT: 37.4 % (ref 36.0–46.0)
Lymphocytes Relative: 22 % (ref 12–46)
Monocytes Relative: 11 % (ref 3–12)
Neutro Abs: 7 10*3/uL (ref 1.7–7.7)
RDW: 14.5 % (ref 11.5–15.5)
WBC: 10.8 10*3/uL — ABNORMAL HIGH (ref 4.0–10.5)

## 2012-04-22 LAB — GLUCOSE, CAPILLARY

## 2012-04-22 LAB — URINE MICROSCOPIC-ADD ON

## 2012-04-22 LAB — URINALYSIS, ROUTINE W REFLEX MICROSCOPIC
Hgb urine dipstick: NEGATIVE
Specific Gravity, Urine: 1.039 — ABNORMAL HIGH (ref 1.005–1.030)
pH: 6 (ref 5.0–8.0)

## 2012-04-22 LAB — COMPREHENSIVE METABOLIC PANEL
ALT: 14 U/L (ref 0–35)
AST: 26 U/L (ref 0–37)
Albumin: 2.7 g/dL — ABNORMAL LOW (ref 3.5–5.2)
Alkaline Phosphatase: 165 U/L — ABNORMAL HIGH (ref 39–117)
Potassium: 4 mEq/L (ref 3.5–5.1)
Sodium: 129 mEq/L — ABNORMAL LOW (ref 135–145)
Total Protein: 8.3 g/dL (ref 6.0–8.3)

## 2012-04-22 LAB — PROTIME-INR: INR: 1.18 (ref 0.00–1.49)

## 2012-04-22 LAB — AMYLASE, BODY FLUID

## 2012-04-22 MED ORDER — INSULIN REGULAR HUMAN 100 UNIT/ML IJ SOLN: 5.0000 [IU] | Freq: Once | INTRAMUSCULAR | Status: DC

## 2012-04-22 MED ORDER — PROMETHAZINE HCL 25 MG/ML IJ SOLN
12.5000 mg | Freq: Once | INTRAMUSCULAR | Status: AC
  Administered 2012-04-22: 12.5 mg via INTRAVENOUS
  Filled 2012-04-22: qty 1

## 2012-04-22 MED ORDER — DEXTROSE 5 % IV SOLN
2.0000 g | Freq: Four times a day (QID) | INTRAVENOUS | Status: DC
  Administered 2012-04-22: 2 g via INTRAVENOUS
  Filled 2012-04-22 (×2): qty 2

## 2012-04-22 MED ORDER — ONDANSETRON HCL 4 MG/2ML IJ SOLN
4.0000 mg | Freq: Once | INTRAMUSCULAR | Status: AC
  Administered 2012-04-22: 4 mg via INTRAVENOUS
  Filled 2012-04-22: qty 2

## 2012-04-22 MED ORDER — SODIUM CHLORIDE 0.9 % IV BOLUS (SEPSIS)
1000.0000 mL | Freq: Once | INTRAVENOUS | Status: AC
  Administered 2012-04-22: 1000 mL via INTRAVENOUS

## 2012-04-22 MED ORDER — INSULIN ASPART 100 UNIT/ML ~~LOC~~ SOLN
5.0000 [IU] | Freq: Once | SUBCUTANEOUS | Status: AC
  Administered 2012-04-22: 5 [IU] via SUBCUTANEOUS
  Filled 2012-04-22: qty 1

## 2012-04-22 MED ORDER — HYDROMORPHONE HCL PF 1 MG/ML IJ SOLN
1.0000 mg | Freq: Once | INTRAMUSCULAR | Status: AC
  Administered 2012-04-22: 1 mg via INTRAVENOUS
  Filled 2012-04-22: qty 1

## 2012-04-22 MED ORDER — FENTANYL CITRATE 0.05 MG/ML IJ SOLN
INTRAMUSCULAR | Status: AC
  Filled 2012-04-22: qty 6

## 2012-04-22 MED ORDER — INSULIN ASPART 100 UNIT/ML ~~LOC~~ SOLN
0.0000 [IU] | Freq: Four times a day (QID) | SUBCUTANEOUS | Status: DC
  Administered 2012-04-22: 4 [IU] via SUBCUTANEOUS
  Administered 2012-04-22: 11 [IU] via SUBCUTANEOUS
  Administered 2012-04-23: 7 [IU] via SUBCUTANEOUS
  Administered 2012-04-23: 4 [IU] via SUBCUTANEOUS
  Administered 2012-04-23: 7 [IU] via SUBCUTANEOUS
  Administered 2012-04-24 (×2): 4 [IU] via SUBCUTANEOUS

## 2012-04-22 MED ORDER — DEXTROSE 5 % IV SOLN
2.0000 g | Freq: Four times a day (QID) | INTRAVENOUS | Status: DC
  Administered 2012-04-22 – 2012-04-25 (×12): 2 g via INTRAVENOUS
  Filled 2012-04-22 (×13): qty 2

## 2012-04-22 MED ORDER — KCL IN DEXTROSE-NACL 20-5-0.9 MEQ/L-%-% IV SOLN
INTRAVENOUS | Status: DC
  Administered 2012-04-22 – 2012-04-23 (×3): via INTRAVENOUS
  Filled 2012-04-22 (×4): qty 1000

## 2012-04-22 MED ORDER — MIDAZOLAM HCL 5 MG/5ML IJ SOLN
INTRAMUSCULAR | Status: AC | PRN
  Administered 2012-04-22 (×2): 1 mg via INTRAVENOUS

## 2012-04-22 MED ORDER — ONDANSETRON HCL 4 MG/2ML IJ SOLN
4.0000 mg | Freq: Four times a day (QID) | INTRAMUSCULAR | Status: DC | PRN
  Administered 2012-04-22 – 2012-04-23 (×3): 4 mg via INTRAVENOUS
  Filled 2012-04-22 (×4): qty 2

## 2012-04-22 MED ORDER — FENTANYL CITRATE 0.05 MG/ML IJ SOLN
INTRAMUSCULAR | Status: AC | PRN
  Administered 2012-04-22: 100 ug via INTRAVENOUS

## 2012-04-22 MED ORDER — INSULIN ASPART 100 UNIT/ML ~~LOC~~ SOLN
0.0000 [IU] | SUBCUTANEOUS | Status: DC
  Administered 2012-04-22: 7 [IU] via SUBCUTANEOUS

## 2012-04-22 MED ORDER — MIDAZOLAM HCL 2 MG/2ML IJ SOLN
INTRAMUSCULAR | Status: AC
  Filled 2012-04-22: qty 6

## 2012-04-22 MED ORDER — MORPHINE SULFATE 4 MG/ML IJ SOLN: 4.0000 mg | Freq: Once | INTRAMUSCULAR | Status: DC

## 2012-04-22 MED ORDER — IOHEXOL 300 MG/ML  SOLN
100.0000 mL | Freq: Once | INTRAMUSCULAR | Status: AC | PRN
  Administered 2012-04-22: 100 mL via INTRAVENOUS

## 2012-04-22 MED ORDER — ENOXAPARIN SODIUM 40 MG/0.4ML ~~LOC~~ SOLN
40.0000 mg | SUBCUTANEOUS | Status: DC
  Administered 2012-04-22 – 2012-04-25 (×3): 40 mg via SUBCUTANEOUS
  Filled 2012-04-22 (×4): qty 0.4

## 2012-04-22 MED ORDER — INFLUENZA VIRUS VACC SPLIT PF IM SUSP
0.5000 mL | INTRAMUSCULAR | Status: AC
  Administered 2012-04-23: 0.5 mL via INTRAMUSCULAR
  Filled 2012-04-22: qty 0.5

## 2012-04-22 MED ORDER — HYDROMORPHONE HCL PF 1 MG/ML IJ SOLN: 1.0000 mg | Freq: Once | INTRAMUSCULAR | Status: DC

## 2012-04-22 MED ORDER — MORPHINE SULFATE 4 MG/ML IJ SOLN
4.0000 mg | INTRAMUSCULAR | Status: DC | PRN
  Administered 2012-04-22 – 2012-04-24 (×5): 4 mg via INTRAVENOUS
  Filled 2012-04-22 (×5): qty 1

## 2012-04-22 MED ORDER — DIPHENHYDRAMINE HCL 12.5 MG/5ML PO ELIX: 12.5000 mg | ORAL_SOLUTION | Freq: Four times a day (QID) | ORAL | Status: DC | PRN

## 2012-04-22 MED ORDER — OXYCODONE HCL 5 MG PO TABS: 5.0000 mg | ORAL_TABLET | ORAL | Status: DC | PRN

## 2012-04-22 MED ORDER — METOCLOPRAMIDE HCL 5 MG/ML IJ SOLN: 10.0000 mg | Freq: Once | INTRAMUSCULAR | Status: DC

## 2012-04-22 MED ORDER — DIPHENHYDRAMINE HCL 50 MG/ML IJ SOLN: 12.5000 mg | Freq: Four times a day (QID) | INTRAMUSCULAR | Status: DC | PRN

## 2012-04-22 NOTE — Procedures (Signed)
Interventional Radiology Procedure Note  Procedure: CT guided abscess drain placement.  1500 mL opaque tan fluid aspirated, collection almost entirely drained with some residual fluid superiorly. Drain: 52F Cook MPD modified with 10 cm of additional side holes along the shaft. Complications:  None Recommendations: - Elevate HOB at least 30 deg to facilitate drainage - Fluid sent for   - Cx  - Bilirubin  - Amylase  - Lipase - Maintain tube to gravity drainage, record output  Signed,  Sterling Big, MD Vascular & Interventional Radiologist [facility]

## 2012-04-22 NOTE — ED Notes (Signed)
Pt has tried multiple times to urinate. Still unable to do so. 2nd bolus started.

## 2012-04-22 NOTE — ED Notes (Signed)
Pt had gall bladder sx, Dr Donell Beers, CCS.  Pt reports N/V every since, worsening today.  Pt reports chills and fever.

## 2012-04-22 NOTE — ED Provider Notes (Signed)
Medical screening examination/treatment/procedure(s) were conducted as a shared visit with non-physician practitioner(s) and myself.  I personally evaluated the patient during the encounter'  Olivia Mackie, MD 04/22/12 [phone]

## 2012-04-22 NOTE — ED Provider Notes (Signed)
History     CSN: [id number]  Arrival date & time 04/22/12  0113   First MD Initiated Contact with Patient 04/22/12 0157      Chief Complaint  Patient presents with  . Emesis   HPI  History provided by the patient. Patient is a 41 year old female with history of diabetes, obesity and recent cholecystectomy presents with complaints of persistent episodic nausea and vomiting. Patient had cholecystectomy perform on September 19 by Dr. Donell Beers. Since that time patient reports having nausea vomiting symptoms. Symptoms are worse anytime she attempts to eat solid foods. She is unable to keep down some fluids at times. Mom is described as food particles and liquid. Patient states today vomiting or appears more dark like bile. She denies any hematemesis. Patient has also had loose soft diarrhea type stools since surgery. She denies any melena or blood in stool. Patient has had some abdominal discomfort and pain around incisions but rates this as mild and relieved with Aleve. Patient also reports having night sweats the past 3 nights. Denies fever. Occasional chills present. Denies any urinary symptoms. No urinary frequency, dysuria, flank pain or hematuria.     Past Medical History  Diagnosis Date  . DM (diabetes mellitus), type 2, uncontrolled   . Anxiety associated with depression   . Hip pain, right     Past Surgical History  Procedure Date  . Cesarean section     x2  . Abdominal hysterectomy 2012  . Cholecystectomy     Family History  Problem Relation Age of Onset  . Anxiety disorder Mother   . Arthritis Father   . Heart disease Maternal Grandfather     History  Substance Use Topics  . Smoking status: Current Every Day Smoker -- 0.5 packs/day for 20 years    Types: Cigarettes  . Smokeless tobacco: Not on file  . Alcohol Use: No    OB History    Grav Para Term Preterm Abortions TAB SAB Ect Mult Living                  Review of Systems  Constitutional: Positive for  chills and diaphoresis. Negative for fever, appetite change and fatigue.  Respiratory: Negative for cough and shortness of breath.   Cardiovascular: Negative for chest pain.  Gastrointestinal: Positive for nausea and vomiting. Negative for abdominal pain, diarrhea and constipation.  Genitourinary: Negative for dysuria, frequency, hematuria and flank pain.  Skin: Negative for rash.    Allergies  Review of patient's allergies indicates no known allergies.  Home Medications   Current Outpatient Rx  Name Route Sig Dispense Refill  . CLONAZEPAM 1 MG PO TABS  Take 1/2 to 1 tablet every day as needed for anxiety 30 tablet 0  . DESVENLAFAXINE SUCCINATE ER 50 MG PO TB24 Oral Take 2 tablets (100 mg total) by mouth daily. 14 tablet 0  . DICLOFENAC SODIUM 75 MG PO TBEC Oral Take 1 tablet (75 mg total) by mouth 2 (two) times daily. 60 tablet 3  . DICYCLOMINE HCL 20 MG PO TABS Oral Take 1 tablet (20 mg total) by mouth every 6 (six) hours. 90 tablet 2  . FLUTICASONE PROPIONATE 50 MCG/ACT NA SUSP Nasal Place 2 sprays into the nose daily. 1 g 6  . GLIPIZIDE ER 10 MG PO TB24 Oral Take 1 tablet (10 mg total) by mouth daily. 30 tablet 1  . GLUCOSE BLOOD VI STRP Other 1 each by Other route as needed. Use as instructed    .  METFORMIN HCL 1000 MG PO TABS Oral Take 1 tablet (1,000 mg total) by mouth 2 (two) times daily with a meal. 60 tablet 6  . MOMETASONE FURO-FORMOTEROL FUM 200-5 MCG/ACT IN AERO Inhalation Inhale 1 puff into the lungs 2 (two) times daily. 1 Inhaler 1  . OXYCODONE HCL 5 MG PO TABS Oral Take 1 tablet (5 mg total) by mouth every 4 (four) hours as needed for pain. 80 tablet 0  . SERTRALINE HCL 100 MG PO TABS Oral Take 1 tablet (100 mg total) by mouth daily. 30 tablet 11    BP 111/84  Pulse 123  Temp 98.2 F (36.8 C) (Oral)  Resp 17  SpO2 100%  Physical Exam  Nursing note and vitals reviewed. Constitutional: She is oriented to person, place, and time. She appears well-developed and  well-nourished. No distress.  HENT:  Head: Normocephalic.  Mouth/Throat: Oropharynx is clear and moist.  Cardiovascular: Regular rhythm.  Tachycardia present.   Pulmonary/Chest: Effort normal and breath sounds normal. No respiratory distress. She has no wheezes. She has no rales.  Abdominal: Soft. There is tenderness in the epigastric area and left upper quadrant. There is no rebound, no guarding, no CVA tenderness, no tenderness at McBurney's point and negative Murphy's sign.       3 well-healing right upper quadrant laparoscopic surgical scars that are C/D/I.  fourth periumbilical microscopic surgical scar also C/D/I. no erythema or induration of surrounding skin. There is mild tenderness.  Neurological: She is alert and oriented to person, place, and time.  Skin: Skin is warm and dry. No rash noted. She is not diaphoretic. No pallor.  Psychiatric: She has a normal mood and affect. Her behavior is normal.    ED Course  Procedures   Results for orders placed during the hospital encounter of 04/22/12  CBC WITH DIFFERENTIAL      Component Value Range   WBC 10.8 (*) 4.0 - 10.5 K/uL   RBC 4.47  3.87 - 5.11 MIL/uL   Hemoglobin 13.1  12.0 - 15.0 g/dL   HCT 96.0  45.4 - 09.8 %   MCV 83.7  78.0 - 100.0 fL   MCH 29.3  26.0 - 34.0 pg   MCHC 35.0  30.0 - 36.0 g/dL   RDW 11.9  14.7 - 82.9 %   Platelets 465 (*) 150 - 400 K/uL   Neutrophils Relative 65  43 - 77 %   Lymphocytes Relative 22  12 - 46 %   Monocytes Relative 11  3 - 12 %   Eosinophils Relative 1  0 - 5 %   Basophils Relative 1  0 - 1 %   Neutro Abs 7.0  1.7 - 7.7 K/uL   Lymphs Abs 2.4  0.7 - 4.0 K/uL   Monocytes Absolute 1.2 (*) 0.1 - 1.0 K/uL   Eosinophils Absolute 0.1  0.0 - 0.7 K/uL   Basophils Absolute 0.1  0.0 - 0.1 K/uL   WBC Morphology MILD LEFT SHIFT (1-5% METAS, OCC MYELO, OCC BANDS)     Smear Review LARGE PLATELETS PRESENT    COMPREHENSIVE METABOLIC PANEL      Component Value Range   Sodium 129 (*) 135 - 145 mEq/L     Potassium 4.0  3.5 - 5.1 mEq/L   Chloride 86 (*) 96 - 112 mEq/L   CO2 28  19 - 32 mEq/L   Glucose, Bld 333 (*) 70 - 99 mg/dL   BUN 7  6 - 23 mg/dL   Creatinine,  Ser 0.48 (*) 0.50 - 1.10 mg/dL   Calcium 8.6  8.4 - 16.1 mg/dL   Total Protein 8.3  6.0 - 8.3 g/dL   Albumin 2.7 (*) 3.5 - 5.2 g/dL   AST 26  0 - 37 U/L   ALT 14  0 - 35 U/L   Alkaline Phosphatase 165 (*) 39 - 117 U/L   Total Bilirubin 0.7  0.3 - 1.2 mg/dL   GFR calc non Af Amer >90  >90 mL/min   GFR calc Af Amer >90  >90 mL/min  LIPASE, BLOOD      Component Value Range   Lipase 175 (*) 11 - 59 U/L  URINALYSIS, ROUTINE W REFLEX MICROSCOPIC      Component Value Range   Color, Urine AMBER (*) YELLOW   APPearance CLOUDY (*) CLEAR   Specific Gravity, Urine 1.039 (*) 1.005 - 1.030   pH 6.0  5.0 - 8.0   Glucose, UA >1000 (*) NEGATIVE mg/dL   Hgb urine dipstick NEGATIVE  NEGATIVE   Bilirubin Urine SMALL (*) NEGATIVE   Ketones, ur 15 (*) NEGATIVE mg/dL   Protein, ur 30 (*) NEGATIVE mg/dL   Urobilinogen, UA 1.0  0.0 - 1.0 mg/dL   Nitrite NEGATIVE  NEGATIVE   Leukocytes, UA NEGATIVE  NEGATIVE  URINE MICROSCOPIC-ADD ON      Component Value Range   Squamous Epithelial / LPF FEW (*) RARE   Bacteria, UA RARE  RARE   Urine-Other MUCOUS PRESENT        No results found.   No diagnosis found.    MDM  1:55 AM patient seen and evaluated. Patient with left upper quadrant tenderness but denies significant pain at rest. Initial treatment with IV fluids and antiemetics. Lab work including CMP and lipase.  Patient having slight improvement after fluids and medications.  Patient discussed with attending physician. Elevated blood sugar and mild elevation of lipase. Patient is having return of symptoms and we will plan to discuss need for CT scan with surgery.  Spoke with Dr. Janee Morn on call for general surgery. Does recommend to go ahead and get CT scan to evaluate pancreas and biliary area.    CT scan still pending.  Patient discussed with attending physician and Remi Haggard NP. We will follow CT scan, reevaluate patient and touch base with surgery.  Angus Seller, [place] 04/22/12 [phone]

## 2012-04-22 NOTE — H&P (Signed)
Meghan Park is an 41 y.o. female.   Chief Complaint: nausea, anorexia HPI: Patient is a 41 year old female with history of diabetes.  She is 10 d s/p l/s chole.  She has continued to have nausea, vomiting and loss of appetite for the past week.  Patient had cholecystectomy perform on September 19 by Dr. Donell Beers. Symptoms are worse when she attempts to eat solid foods.  Patient has also had loose soft diarrhea type stools since surgery. She denies any melena. Patient has had some abdominal discomfort and pain around incisions but rates this as mild and relieved with Aleve. Patient also reports having night sweats the past 3 nights. Complains of low grade fever.  Denies any urinary symptoms. No urinary frequency, dysuria, flank pain or hematuria. Patient reported to the ED after bilious emesis.   She has been running low grade fevers.  She presented to the ED last night.   Past Medical History  Diagnosis Date  . DM (diabetes mellitus), type 2, uncontrolled   . Anxiety associated with depression   . Hip pain, right     Past Surgical History  Procedure Date  . Cesarean section     x2  . Abdominal hysterectomy 2012  . Cholecystectomy     Family History  Problem Relation Age of Onset  . Anxiety disorder Mother   . Arthritis Father   . Heart disease Maternal Grandfather    Social History:  reports that she has been smoking Cigarettes.  She has a 10 pack-year smoking history. She does not have any smokeless tobacco history on file. She reports that she does not drink alcohol or use illicit drugs.  Allergies: No Known Allergies   (Not in a hospital admission)  Results for orders placed during the hospital encounter of 04/22/12 (from the past 48 hour(s))  CBC WITH DIFFERENTIAL     Status: Abnormal   Collection Time   04/22/12  1:45 AM      Component Value Range Comment   WBC 10.8 (*) 4.0 - 10.5 K/uL    RBC 4.47  3.87 - 5.11 MIL/uL    Hemoglobin 13.1  12.0 - 15.0 g/dL    HCT 16.1   09.6 - 04.5 %    MCV 83.7  78.0 - 100.0 fL    MCH 29.3  26.0 - 34.0 pg    MCHC 35.0  30.0 - 36.0 g/dL    RDW 40.9  81.1 - 91.4 %    Platelets 465 (*) 150 - 400 K/uL    Neutrophils Relative 65  43 - 77 %    Lymphocytes Relative 22  12 - 46 %    Monocytes Relative 11  3 - 12 %    Eosinophils Relative 1  0 - 5 %    Basophils Relative 1  0 - 1 %    Neutro Abs 7.0  1.7 - 7.7 K/uL    Lymphs Abs 2.4  0.7 - 4.0 K/uL    Monocytes Absolute 1.2 (*) 0.1 - 1.0 K/uL    Eosinophils Absolute 0.1  0.0 - 0.7 K/uL    Basophils Absolute 0.1  0.0 - 0.1 K/uL    WBC Morphology MILD LEFT SHIFT (1-5% METAS, OCC MYELO, OCC BANDS)      Smear Review LARGE PLATELETS PRESENT     COMPREHENSIVE METABOLIC PANEL     Status: Abnormal   Collection Time   04/22/12  1:45 AM      Component Value Range Comment  Sodium 129 (*) 135 - 145 mEq/L    Potassium 4.0  3.5 - 5.1 mEq/L SLIGHT HEMOLYSIS   Chloride 86 (*) 96 - 112 mEq/L    CO2 28  19 - 32 mEq/L    Glucose, Bld 333 (*) 70 - 99 mg/dL    BUN 7  6 - 23 mg/dL    Creatinine, Ser 1.19 (*) 0.50 - 1.10 mg/dL    Calcium 8.6  8.4 - 14.7 mg/dL    Total Protein 8.3  6.0 - 8.3 g/dL    Albumin 2.7 (*) 3.5 - 5.2 g/dL    AST 26  0 - 37 U/L    ALT 14  0 - 35 U/L    Alkaline Phosphatase 165 (*) 39 - 117 U/L    Total Bilirubin 0.7  0.3 - 1.2 mg/dL    GFR calc non Af Amer >90  >90 mL/min    GFR calc Af Amer >90  >90 mL/min   LIPASE, BLOOD     Status: Abnormal   Collection Time   04/22/12  1:45 AM      Component Value Range Comment   Lipase 175 (*) 11 - 59 U/L   URINALYSIS, ROUTINE W REFLEX MICROSCOPIC     Status: Abnormal   Collection Time   04/22/12  4:11 AM      Component Value Range Comment   Color, Urine AMBER (*) YELLOW BIOCHEMICALS MAY BE AFFECTED BY COLOR   APPearance CLOUDY (*) CLEAR    Specific Gravity, Urine 1.039 (*) 1.005 - 1.030    pH 6.0  5.0 - 8.0    Glucose, UA >1000 (*) NEGATIVE mg/dL    Hgb urine dipstick NEGATIVE  NEGATIVE    Bilirubin Urine SMALL (*)  NEGATIVE    Ketones, ur 15 (*) NEGATIVE mg/dL    Protein, ur 30 (*) NEGATIVE mg/dL    Urobilinogen, UA 1.0  0.0 - 1.0 mg/dL    Nitrite NEGATIVE  NEGATIVE    Leukocytes, UA NEGATIVE  NEGATIVE   URINE MICROSCOPIC-ADD ON     Status: Abnormal   Collection Time   04/22/12  4:11 AM      Component Value Range Comment   Squamous Epithelial / LPF FEW (*) RARE    Bacteria, UA RARE  RARE    Urine-Other MUCOUS PRESENT     GLUCOSE, CAPILLARY     Status: Abnormal   Collection Time   04/22/12  6:25 AM      Component Value Range Comment   Glucose-Capillary 292 (*) 70 - 99 mg/dL    Comment 1 Documented in Chart      Comment 2 Notify RN     GLUCOSE, CAPILLARY     Status: Abnormal   Collection Time   04/22/12  6:53 AM      Component Value Range Comment   Glucose-Capillary 263 (*) 70 - 99 mg/dL   PROTIME-INR     Status: Normal   Collection Time   04/22/12  9:18 AM      Component Value Range Comment   Prothrombin Time 14.8  11.6 - 15.2 seconds    INR 1.18  0.00 - 1.49    Ct Abdomen Pelvis W Contrast  04/22/2012  *RADIOLOGY REPORT*  Clinical Data: Nausea and vomiting. Recent cholecystectomy.  CT ABDOMEN AND PELVIS WITH CONTRAST  Technique:  Multidetector CT imaging of the abdomen and pelvis was performed following the standard protocol during bolus administration of intravenous contrast.  Contrast: OMNIPAQUE IOHEXOL 300  MG/ML  SOLN  Comparison: Abdominal ultrasound 03/21/2012  Findings: There is mild basilar atelectasis.  There is a massive air-fluid collection along the left side of the liver that extends into the gallbladder fossa.  This collection measures 13.5 x 13.9 x 16.3 cm.   This collection may be within the liver subcapsular region.  Portal venous system is patent.  There is no gross abnormality to the spleen or pancreas.  Normal appearance of the adrenal glands and kidneys.  Multiple small lymph nodes throughout the periaortic retroperitoneum.  There is minimal free fluid in the pelvis.  The  left adnexa tissue is present and there may be a prominent follicle or small cyst involving the left ovary.  The uterus has been removed.  Fluid in the urinary bladder. No acute bony abnormality.  Postoperative changes in the periumbilical and anterior abdominal region.  IMPRESSION: There is a massive perihepatic air-fluid collection along the left side of the liver that extends into the gallbladder fossa. Findings are suggestive for a large abscess collection or biloma. This collection would be amendable to percutaneous drainage.  These results were called by telephone on 04/22/2012 at 06:41 a.m. to Dr. Norlene Campbell, who verbally acknowledged these results.   Original Report Authenticated By: Richarda Overlie, M.D.     Review of Systems  Constitutional: Positive for fever. Negative for chills.  Respiratory: Negative for cough and shortness of breath.   Cardiovascular: Negative for chest pain.  Gastrointestinal: Positive for heartburn, nausea and abdominal pain.  Genitourinary: Negative for dysuria and hematuria.  Skin: Negative for rash.  Neurological: Positive for weakness. Negative for dizziness and headaches.    Blood pressure 108/69, pulse 90, temperature 98.6 F (37 C), temperature source Oral, resp. rate 20, SpO2 99.00%. Physical Exam  Constitutional: She is oriented to person, place, and time. She appears well-developed and well-nourished.  HENT:  Head: Normocephalic and atraumatic.  Eyes: Pupils are equal, round, and reactive to light.  Neck: Normal range of motion.  Cardiovascular: Normal rate, regular rhythm and normal heart sounds.   Respiratory: Effort normal and breath sounds normal. No respiratory distress.  GI: Soft. She exhibits distension. There is tenderness.  Musculoskeletal: She exhibits no edema.  Neurological: She is alert and oriented to person, place, and time.  Skin: Skin is warm and dry.     Assessment/Plan Subcapsular liver fluid collection with air.  Concerning for  biloma.  CT guided drain placement today.  Will admit to hospital and start antibiotics.  Patient will need ERCP for stent placement.  NPO for drain placement.  Jesika Men C. 04/22/2012, 10:10 AM

## 2012-04-22 NOTE — ED Notes (Signed)
Pt attempted to use the restroom & could not provide a sample. Will try again after fluid bolus.

## 2012-04-22 NOTE — Plan of Care (Signed)
Problem: Phase I Progression Outcomes Goal: Voiding-avoid urinary catheter unless indicated Outcome: Completed/Met Date Met:  04/22/12 Tea colored urine

## 2012-04-22 NOTE — Consult Note (Signed)
Interventional Radiology Consultation and Pre-Procedure Evaluation Note  Reason for Consult: Perihepatic fluid collection, likely biloma Referring Physician: Almond Lint, MD  HPI: Meghan Park is an 41 y.o. female with a PMH significant for DM and cholecystitis.  She is post-op day 10 s/p laparoscopic cholecystectomy and presents with persistent N/V and decreased appetite x 1 week. Imaging evaluation reveals a large 13.5 x 13.9 x 16.3 cm perihepatic fluid collection which is contiguous with the gallbladder fossa and suspicious for biloma. She has had some chills but denies fever.  She presents for image guided drain placement.   Past Medical History:  Past Medical History  Diagnosis Date  . DM (diabetes mellitus), type 2, uncontrolled   . Anxiety associated with depression   . Hip pain, right     Surgical History:  Past Surgical History  Procedure Date  . Cesarean section     x2  . Abdominal hysterectomy 2012  . Cholecystectomy     Family History:  Family History  Problem Relation Age of Onset  . Anxiety disorder Mother   . Arthritis Father   . Heart disease Maternal Grandfather     Social History:  reports that she has been smoking Cigarettes.  She has a 10 pack-year smoking history. She does not have any smokeless tobacco history on file. She reports that she does not drink alcohol or use illicit drugs.  Allergies: No Known Allergies  Medications: I have reviewed the patient's current medications.  ROS: See HPI for pertinent findings, otherwise complete 10 system review negative.  Physical Exam: Blood pressure 117/81, pulse 96, temperature 99 F (37.2 C), temperature source Oral, resp. rate 18, height 5\' 2"  (1.575 m), weight 204 lb (92.534 kg), SpO2 94.00%.     Labs: CBC  Basename 04/22/12 0145  WBC 10.8*  HGB 13.1  HCT 37.4  PLT 465*   MET  Basename 04/22/12 0145  NA 129*  K 4.0  CL 86*  CO2 28  GLUCOSE 333*  BUN 7  CREATININE 0.48*  CALCIUM 8.6      Basename 04/22/12 0145  PROT 8.3  ALBUMIN 2.7*  AST 26  ALT 14  ALKPHOS 165*  BILITOT 0.7  BILIDIR --  IBILI --  LIPASE 175*   PT/INR  Basename 04/22/12 0918  LABPROT 14.8  INR 1.18   ABG No results found for this basename: PHART:2,PCO2:2,PO2:2,HCO3:2 in the last 72 hours    Ct Abdomen Pelvis W Contrast  04/22/2012  *RADIOLOGY REPORT*  Clinical Data: Nausea and vomiting. Recent cholecystectomy.  CT ABDOMEN AND PELVIS WITH CONTRAST  Technique:  Multidetector CT imaging of the abdomen and pelvis was performed following the standard protocol during bolus administration of intravenous contrast.  Contrast: OMNIPAQUE IOHEXOL 300 MG/ML  SOLN  Comparison: Abdominal ultrasound 03/21/2012  Findings: There is mild basilar atelectasis.  There is a massive air-fluid collection along the left side of the liver that extends into the gallbladder fossa.  This collection measures 13.5 x 13.9 x 16.3 cm.   This collection may be within the liver subcapsular region.  Portal venous system is patent.  There is no gross abnormality to the spleen or pancreas.  Normal appearance of the adrenal glands and kidneys.  Multiple small lymph nodes throughout the periaortic retroperitoneum.  There is minimal free fluid in the pelvis.  The left adnexa tissue is present and there may be a prominent follicle or small cyst involving the left ovary.  The uterus has been removed.  Fluid in  the urinary bladder. No acute bony abnormality.  Postoperative changes in the periumbilical and anterior abdominal region.  IMPRESSION: There is a massive perihepatic air-fluid collection along the left side of the liver that extends into the gallbladder fossa. Findings are suggestive for a large abscess collection or biloma. This collection would be amendable to percutaneous drainage.  These results were called by telephone on 04/22/2012 at 06:41 a.m. to Dr. Norlene Campbell, who verbally acknowledged these results.   Original Report  Authenticated By: Richarda Overlie, M.D.     Assessment/Plan: 41 yo female with large perihepatic collection suspicious for biloma vs abscess in the setting of recent cholecystectomy.  I suspect biloma given paucity of systemic symptoms.  - Proceed with CT guided drain placement.  I will use a biliary drain with multiple sideholes along the tube shaft to facilitate complete drainage and hopefully prevent segmentation/loculation of the collection as it drains.  - Pt currently on mefoxin, continue Abx - Will send fluid for culture   Signed,  Sterling Big, MD Vascular & Interventional Radiologist [facility]   04/22/2012, 11:05 AM

## 2012-04-22 NOTE — ED Notes (Signed)
PA at bedside.

## 2012-04-22 NOTE — ED Notes (Signed)
Pt had cholecystectomy 9/19; developed vomiting today-- yellow bile; last bm tonight; states abd pain is ok--no worse

## 2012-04-22 NOTE — ED Provider Notes (Signed)
0600 report received from [place] PA for this 41 year old female with increased nausea and vomiting, pain after cholecystectomy on the 19th. She she has an elevated lipase and a sodium of 129. Dr. Maisie Fus will be notified with her CT of the abdomen results have comes back.  CBG is 292 we will give 5 units of regular insulin. She also has pain 6/10 when given 1 mg of Dilaudid and some Phenergan for nausea.  Remi Haggard, NP 04/22/12 0701  0700 patient will be admitted to surgical service is Dr. Maisie Fus. CT of the abdomen shows a possible abscess on the left side of the liver. Patient may need a percutaneous drainage.  Remi Haggard, NP 04/22/12 [phone]

## 2012-04-22 NOTE — ED Provider Notes (Signed)
Medical screening examination/treatment/procedure(s) were performed by non-physician practitioner and as supervising physician I was immediately available for consultation/collaboration.  Jamielyn Petrucci M Idabelle Mcpeters, MD 04/22/12 0638 

## 2012-04-23 LAB — BASIC METABOLIC PANEL
CO2: 27 mEq/L (ref 19–32)
Calcium: 7.4 mg/dL — ABNORMAL LOW (ref 8.4–10.5)
Chloride: 99 mEq/L (ref 96–112)
Potassium: 3.3 mEq/L — ABNORMAL LOW (ref 3.5–5.1)
Sodium: 133 mEq/L — ABNORMAL LOW (ref 135–145)

## 2012-04-23 LAB — GLUCOSE, CAPILLARY
Glucose-Capillary: 180 mg/dL — ABNORMAL HIGH (ref 70–99)
Glucose-Capillary: 228 mg/dL — ABNORMAL HIGH (ref 70–99)

## 2012-04-23 LAB — CBC
HCT: 29.7 % — ABNORMAL LOW (ref 36.0–46.0)
MCV: 84.9 fL (ref 78.0–100.0)
Platelets: 325 10*3/uL (ref 150–400)
RBC: 3.5 MIL/uL — ABNORMAL LOW (ref 3.87–5.11)
WBC: 6.4 10*3/uL (ref 4.0–10.5)

## 2012-04-23 MED ORDER — SERTRALINE HCL 100 MG PO TABS
100.0000 mg | ORAL_TABLET | Freq: Every day | ORAL | Status: DC
  Administered 2012-04-23 – 2012-04-24 (×2): 100 mg via ORAL
  Filled 2012-04-23 (×3): qty 1

## 2012-04-23 MED ORDER — ENSURE COMPLETE PO LIQD
237.0000 mL | Freq: Two times a day (BID) | ORAL | Status: DC
  Administered 2012-04-23: 237 mL via ORAL

## 2012-04-23 MED ORDER — CLONAZEPAM 1 MG PO TABS
1.0000 mg | ORAL_TABLET | Freq: Every day | ORAL | Status: DC
  Administered 2012-04-23 – 2012-04-24 (×2): 1 mg via ORAL
  Filled 2012-04-23 (×2): qty 1

## 2012-04-23 MED ORDER — KCL IN DEXTROSE-NACL 40-5-0.45 MEQ/L-%-% IV SOLN
INTRAVENOUS | Status: AC
  Administered 2012-04-23: 75 mL/h via INTRAVENOUS
  Filled 2012-04-23 (×4): qty 1000

## 2012-04-23 NOTE — Progress Notes (Signed)
Subjective: Meghan Park is a 41 y.o. female who presented to the ED with nausea, vomiting and anorexia for ~1wk.  She is s/p laparoscopic cholecystectomy.  A CT was performed that showed a subhepatic fluid collection.  This was drained in IR on Sat and contents were bilious with debris.  The fluid was sent for culture, amylase, lipase and bilirubin.    Overnight, she felt much better.  She tolerated some liquids.  This morning she has some nausea.    Objective: Vital signs in last 24 hours: Temp:  [98.5 F (36.9 C)-99.4 F (37.4 C)] 98.9 F (37.2 C) (09/29 0455) Pulse Rate:  [87-103] 87  (09/29 0455) Resp:  [13-20] 18  (09/29 0455) BP: (92-130)/(49-81) 113/69 mmHg (09/29 0455) SpO2:  [94 %-100 %] 97 % (09/29 0455) Weight:  [204 lb (92.534 kg)] 204 lb (92.534 kg) (09/28 1013)   Intake/Output from previous day: 09/28 0701 - 09/29 0700 In: 2670 [P.O.:480; I.V.:2025; IV Piggyback:150] Out: 3075 [Urine:2250; Drains:825] Intake/Output this shift:     General appearance: alert, cooperative and no distress Resp: clear to auscultation bilaterally Cardio: regular rate and rhythm GI: soft, mildly tender to palpation JP: bilious output with debris Incision: healing well  Lab Results:   Basename 04/23/12 0453 04/22/12 0145  WBC 6.4 10.8*  HGB 10.1* 13.1  HCT 29.7* 37.4  PLT 325 465*   BMET  Basename 04/23/12 0453 04/22/12 0145  NA 133* 129*  K 3.3* 4.0  CL 99 86*  CO2 27 28  GLUCOSE 216* 333*  BUN 3* 7  CREATININE 0.56 0.48*  CALCIUM 7.4* 8.6   PT/INR  Basename 04/22/12 0918  LABPROT 14.8  INR 1.18   Drain amylase: 338, Drain lipase: 2426, Drain culture and bilirubin pending  MEDS, Scheduled    . cefOXitin  2 g Intravenous Q6H  . enoxaparin  40 mg Subcutaneous Q24H  . fentaNYL      . influenza  inactive virus vaccine  0.5 mL Intramuscular Tomorrow-1000  . insulin aspart  0-20 Units Subcutaneous Q6H  . midazolam      . DISCONTD: cefOXitin  2 g Intravenous  Q6H  . DISCONTD: insulin aspart  0-20 Units Subcutaneous Q4H    Studies/Results: Ct Guided Abscess Drain  04/22/2012  *RADIOLOGY REPORT*  CT GUIDED ABSCESS DRAIN  Date: 04/22/2012  Clinical History: 41-year-old female with a history of cholecystitis status post laparoscopic cholecystectomy 10 days ago. She presents with persistent nausea, vomiting and abdominal discomfort.  CT imaging demonstrates a large perihepatic fluid collection concern for possible myeloma.  She presents for percutaneous drainage.  Procedures Performed: 1. CT guided drain placement  Interventional Radiologist:  Heath K. McCullough, MD  Sedation: Moderate (conscious) sedation was used.  Two mg Versed, 100 mcg Fentanyl were administered intravenously.  The patient's vital signs were monitored continuously by radiology nursing throughout the procedure.  Sedation Time: 35 minutes  PROCEDURE/FINDINGS:   Informed consent was obtained from the patient following explanation of the procedure, risks, benefits and alternatives. The patient understands, agrees and consents for the procedure. All questions were addressed. A time out was performed.  Maximal barrier sterile technique utilized including caps, mask, sterile gowns, sterile gloves, large sterile drape, hand hygiene, and betadine skin prep.  A planning axial CT scan was performed.  The perihepatic, possibly subcapsular fluid collection was identified.  A suitable skin entry site was selected and marked.  Local anesthesia was obtained with infiltration of 1% lidocaine.  An 18 gauge trocar needle was   then advanced under CT fluoroscopic guidance through the anterior abdominal wall and in the fluid collection.  A short Amplatz wire was directed through the collection and into the gallbladder fossa. The tract was then serially dilated to 12-French.  A 12 French Cook multipurpose drainage catheter was then modified with additional side holes.  Approximately eight additional side holes were added  along approximately 10 cm of the tube shaft.  The drain was then advanced over the Amplatz wire and the locking loop positioned in the gallbladder fossa.  The additional side holes were added to prevent loculation of the fluid collection as it drains because of its large size and somewhat irregular shape.  1500 ml of opaque tan colored fluid was then successfully aspirated.  A follow-up CT scan demonstrated near total resolution of the fluid collection.  There is some fluid anterior, and superior to the left hepatic lobe.  This should drain with upright positioning. The catheter was secured to the skin using 0-Prolene suture and plastic bumper technique.  An adhesive retention device was also employed.  The patient tolerated the procedure well, there was no immediate complication.  She was returned to the floor in stable condition.  IMPRESSION:  Successful CT guided drain placement and left perihepatic/subcapsular fluid collection.  A total of 1500 ml of non-viscous, tan colored, opaque fluid was aspirated.  The fluid was sent for culture as well as evaluation for bilirubin, amylase and lipase.  The fluid does not have a classic appearance of bile. Differential considerations include succus entericus, pancreatic fluid, and very dilute bile.  Recommended maintaining bag to gravity drainage and having the head of the bed elevated at least 30 degrees to facilitate complete drainage of the residual fluid.  These results were called by telephone on 04/22/2012 at 12:15 p.m. to Dr. David Newman, who verbally acknowledged these results.  Signed,  Heath K. McCullough, MD Vascular & Interventional Radiologist [facility]   Original Report Authenticated By: HEATH    Ct Abdomen Pelvis W Contrast  04/22/2012  *RADIOLOGY REPORT*  Clinical Data: Nausea and vomiting. Recent cholecystectomy.  CT ABDOMEN AND PELVIS WITH CONTRAST  Technique:  Multidetector CT imaging of the abdomen and pelvis was performed following the  standard protocol during bolus administration of intravenous contrast.  Contrast: 100mL OMNIPAQUE IOHEXOL 300 MG/ML  SOLN  Comparison: Abdominal ultrasound 03/21/2012  Findings: There is mild basilar atelectasis.  There is a massive air-fluid collection along the left side of the liver that extends into the gallbladder fossa.  This collection measures 13.5 x 13.9 x 16.3 cm.   This collection may be within the liver subcapsular region.  Portal venous system is patent.  There is no gross abnormality to the spleen or pancreas.  Normal appearance of the adrenal glands and kidneys.  Multiple small lymph nodes throughout the periaortic retroperitoneum.  There is minimal free fluid in the pelvis.  The left adnexa tissue is present and there may be a prominent follicle or small cyst involving the left ovary.  The uterus has been removed.  Fluid in the urinary bladder. No acute bony abnormality.  Postoperative changes in the periumbilical and anterior abdominal region.  IMPRESSION: There is a massive perihepatic air-fluid collection along the left side of the liver that extends into the gallbladder fossa. Findings are suggestive for a large abscess collection or biloma. This collection would be amendable to percutaneous drainage.  These results were called by telephone on 04/22/2012 at 06:41 a.m. to Dr. Otter, who verbally   acknowledged these results.   Original Report Authenticated By: ADAM HENN, M.D.     Assessment: s/p  Patient Active Problem List  Diagnosis  . Anxiety associated with depression  . DM (diabetes mellitus), type 2, uncontrolled  . Hip pain, right  . Chronic cholecystitis with calculus   Drain studies concerning for post op bile leak or enteric injury   Plan: Continue drain today, will allow to decompress for now Will change IV fluids to D5 1/2NS with 40meq KCL to address hypokalemia Malnutrition- alb 2.7, will check prealb in AM, may need supplements or TPN depending on source of leak NPO  after MN for possible further studies in AM   LOS: 1 day     .Kamiah Fite C Zanyah Lentsch, MD [facility] [phone]   04/23/2012 8:29 AM      

## 2012-04-23 NOTE — Progress Notes (Signed)
Subjective: Nauseated but improved symptoms since drain placed yesterday by IR.   Objective: Vital signs in last 24 hours: Temp:  [98.5 F (36.9 C)-99.4 F (37.4 C)] 98.9 F (37.2 C) (09/29 0455) Pulse Rate:  [87-103] 87  (09/29 0455) Resp:  [13-20] 18  (09/29 0455) BP: (92-130)/(49-72) 113/69 mmHg (09/29 0455) SpO2:  [94 %-100 %] 97 % (09/29 0455) Last BM Date: 04/22/12  Intake/Output from previous day: 09/28 0701 - 09/29 0700 In: 2670 [P.O.:480; I.V.:2025; IV Piggyback:150] Out: 3075 [Urine:2250; Drains:825] Intake/Output this shift: Total I/O In: 696.7 [I.V.:696.7] Out: -   PE:  Awake, alert, Drain intact with insertion site clean and dry. Bilious drainage in bag with debris with 825 ml output since placement.  Abdomen soft, minimally tender with positive bowel sounds. Studies on fluid c/w bile leak post op. Cultures pending.   Lab Results:   Basename 04/23/12 0453 04/22/12 0145  WBC 6.4 10.8*  HGB 10.1* 13.1  HCT 29.7* 37.4  PLT 325 465*   BMET  Basename 04/23/12 0453 04/22/12 0145  NA 133* 129*  K 3.3* 4.0  CL 99 86*  CO2 27 28  GLUCOSE 216* 333*  BUN 3* 7  CREATININE 0.56 0.48*  CALCIUM 7.4* 8.6   PT/INR  Basename 04/22/12 0918  LABPROT 14.8  INR 1.18    Studies/Results: Ct Guided Abscess Drain  04/22/2012  *RADIOLOGY REPORT*  CT GUIDED ABSCESS DRAIN  Date: 04/22/2012  Clinical History: 41 year old female with a history of cholecystitis status post laparoscopic cholecystectomy 10 days ago. She presents with persistent nausea, vomiting and abdominal discomfort.  CT imaging demonstrates a large perihepatic fluid collection concern for possible myeloma.  She presents for percutaneous drainage.  Procedures Performed: 1. CT guided drain placement  Interventional Radiologist:  Sterling Big, MD  Sedation: Moderate (conscious) sedation was used.  Two mg Versed, 100 mcg Fentanyl were administered intravenously.  The patient's vital signs were monitored  continuously by radiology nursing throughout the procedure.  Sedation Time: 35 minutes  PROCEDURE/FINDINGS:   Informed consent was obtained from the patient following explanation of the procedure, risks, benefits and alternatives. The patient understands, agrees and consents for the procedure. All questions were addressed. A time out was performed.  Maximal barrier sterile technique utilized including caps, mask, sterile gowns, sterile gloves, large sterile drape, hand hygiene, and betadine skin prep.  A planning axial CT scan was performed.  The perihepatic, possibly subcapsular fluid collection was identified.  A suitable skin entry site was selected and marked.  Local anesthesia was obtained with infiltration of 1% lidocaine.  An 18 gauge trocar needle was then advanced under CT fluoroscopic guidance through the anterior abdominal wall and in the fluid collection.  A short Amplatz wire was directed through the collection and into the gallbladder fossa. The tract was then serially dilated to 12-French.  A 12 French Cook multipurpose drainage catheter was then modified with additional side holes.  Approximately eight additional side holes were added along approximately 10 cm of the tube shaft.  The drain was then advanced over the Amplatz wire and the locking loop positioned in the gallbladder fossa.  The additional side holes were added to prevent loculation of the fluid collection as it drains because of its large size and somewhat irregular shape.  1500 ml of opaque tan colored fluid was then successfully aspirated.  A follow-up CT scan demonstrated near total resolution of the fluid collection.  There is some fluid anterior, and superior to the left hepatic  lobe.  This should drain with upright positioning. The catheter was secured to the skin using 0-Prolene suture and plastic bumper technique.  An adhesive retention device was also employed.  The patient tolerated the procedure well, there was no immediate  complication.  She was returned to the floor in stable condition.  IMPRESSION:  Successful CT guided drain placement and left perihepatic/subcapsular fluid collection.  A total of 1500 ml of non-viscous, tan colored, opaque fluid was aspirated.  The fluid was sent for culture as well as evaluation for bilirubin, amylase and lipase.  The fluid does not have a classic appearance of bile. Differential considerations include succus entericus, pancreatic fluid, and very dilute bile.  Recommended maintaining bag to gravity drainage and having the head of the bed elevated at least 30 degrees to facilitate complete drainage of the residual fluid.  These results were called by telephone on 04/22/2012 at 12:15 p.m. to Dr. Ovidio Kin, who verbally acknowledged these results.  Signed,  Sterling Big, MD Vascular & Interventional Radiologist [facility]   Original Report Authenticated By: Threasa Beards Abdomen Pelvis W Contrast  04/22/2012  *RADIOLOGY REPORT*  Clinical Data: Nausea and vomiting. Recent cholecystectomy.  CT ABDOMEN AND PELVIS WITH CONTRAST  Technique:  Multidetector CT imaging of the abdomen and pelvis was performed following the standard protocol during bolus administration of intravenous contrast.  Contrast: OMNIPAQUE IOHEXOL 300 MG/ML  SOLN  Comparison: Abdominal ultrasound 03/21/2012  Findings: There is mild basilar atelectasis.  There is a massive air-fluid collection along the left side of the liver that extends into the gallbladder fossa.  This collection measures 13.5 x 13.9 x 16.3 cm.   This collection may be within the liver subcapsular region.  Portal venous system is patent.  There is no gross abnormality to the spleen or pancreas.  Normal appearance of the adrenal glands and kidneys.  Multiple small lymph nodes throughout the periaortic retroperitoneum.  There is minimal free fluid in the pelvis.  The left adnexa tissue is present and there may be a prominent follicle or small  cyst involving the left ovary.  The uterus has been removed.  Fluid in the urinary bladder. No acute bony abnormality.  Postoperative changes in the periumbilical and anterior abdominal region.  IMPRESSION: There is a massive perihepatic air-fluid collection along the left side of the liver that extends into the gallbladder fossa. Findings are suggestive for a large abscess collection or biloma. This collection would be amendable to percutaneous drainage.  These results were called by telephone on 04/22/2012 at 06:41 a.m. to Dr. Norlene Campbell, who verbally acknowledged these results.   Original Report Authenticated By: Richarda Overlie, M.D.     Anti-infectives: Anti-infectives     Start     Dose/Rate Route Frequency Ordered Stop   04/22/12 1500   cefOXitin (MEFOXIN) 2 g in dextrose 5 % 50 mL IVPB        2 g 100 mL/hr over 30 Minutes Intravenous 4 times per day 04/22/12 1206     04/22/12 0830   cefOXitin (MEFOXIN) 2 g in dextrose 5 % 50 mL IVPB  Status:  Discontinued        2 g 100 mL/hr over 30 Minutes Intravenous 4 times per day 04/22/12 0816 04/22/12 1206          Assessment/Plan: Perihepatic fluid collection post chole concerning for bile leak s/p percutaneous drainage 9/28.  To continue drain for now and follow cultures.  IR to follow  along with surgery.      LOS: 1 day    CAMPBELL,PAMELA D 04/23/2012

## 2012-04-23 NOTE — Progress Notes (Signed)
INITIAL ADULT NUTRITION ASSESSMENT Date: 04/23/2012   Time: 9:46 AM Reason for Assessment: Malnutrition Screening Tool- 3  ASSESSMENT: Female 41 y.o.  Dx: Day 11 s/p cholecystectomy; chronic cholecystitis   Past Medical History  Diagnosis Date  . DM (diabetes mellitus), type 2, uncontrolled   . Anxiety associated with depression   . Hip pain, right     Scheduled Meds:    . cefOXitin  2 g Intravenous Q6H  . enoxaparin  40 mg Subcutaneous Q24H  . fentaNYL      . influenza  inactive virus vaccine  0.5 mL Intramuscular Tomorrow-1000  . insulin aspart  0-20 Units Subcutaneous Q6H  . midazolam      . DISCONTD: cefOXitin  2 g Intravenous Q6H  . DISCONTD: insulin aspart  0-20 Units Subcutaneous Q4H   Continuous Infusions:    . dextrose 5 % and 0.45 % NaCl with KCl 40 mEq/L    . DISCONTD: dextrose 5 % and 0.9 % NaCl with KCl 20 mEq/L 100 mL/hr at 04/23/12 0648   PRN Meds:.diphenhydrAMINE, diphenhydrAMINE, fentaNYL, midazolam, morphine injection, ondansetron, oxyCODONE   Ht: 5\' 2"  (157.5 cm)  Wt: 204 lb (92.534 kg) (2.8% weight loss in 3 weeks not significant)  Ideal Wt: 50.1 kg  % Ideal Wt: 184%  Usual Wt: 91.8- 98 kg % Usual Wt: ~100% Wt Readings from Last 10 Encounters:  04/22/12 204 lb (92.534 kg)  03/28/12 210 lb (95.255 kg)  02/17/12 212 lb 12.8 oz (96.525 kg)  12/11/11 213 lb 3.2 oz (96.707 kg)  11/20/11 218 lb (98.884 kg)  09/28/11 208 lb (94.348 kg)  09/05/11 202 lb 6.4 oz (91.808 kg)    Body mass index is 37.31 kg/(m^2).  Food/Nutrition Related Hx:  Pt reports persistent nausea, vomiting and abdominal pain x 1 week PTA. Tolerating some fluids currently. Suspect low albumin may be at least partially due to inflammatory/infection response.  Labs:  CMP     Component Value Date/Time   NA 133* 04/23/2012 0453   K 3.3* 04/23/2012 0453   CL 99 04/23/2012 0453   CO2 27 04/23/2012 0453   GLUCOSE 216* 04/23/2012 0453   BUN 3* 04/23/2012 0453   CREATININE 0.56  04/23/2012 0453   CREATININE 0.60 02/17/2012 0827   CALCIUM 7.4* 04/23/2012 0453   PROT 8.3 04/22/2012 0145   ALBUMIN 2.7* 04/22/2012 0145   AST 26 04/22/2012 0145   ALT 14 04/22/2012 0145   ALKPHOS 165* 04/22/2012 0145   BILITOT 0.7 04/22/2012 0145   GFRNONAA >90 04/23/2012 0453   GFRAA >90 04/23/2012 0453    CBG (last 3)   Basename 04/23/12 0348 04/22/12 2352 04/22/12 1745  GLUCAP 180* 293* 160*    Lab Results  Component Value Date   HGBA1C 8.1 02/17/2012   HGBA1C 7.3 12/11/2011   HGBA1C 7.5 09/05/2011   Lab Results  Component Value Date   LDLCALC 107* 09/05/2011   CREATININE 0.56 04/23/2012    Intake/Output Summary (Last 24 hours) at 04/23/12 0946 Last data filed at 04/23/12 4782  Gross per 24 hour  Intake 3066.68 ml  Output   3075 ml  Net  -8.32 ml    Diet Order: Full Liquid (no PO intake documented) NPO after MN for studies in AM  Supplements/Tube Feeding: None at this time.  IVF:     dextrose 5 % and 0.45 % NaCl with KCl 40 mEq/L   DISCONTD: dextrose 5 % and 0.9 % NaCl with KCl 20 mEq/L Last Rate: 100 mL/hr at  04/23/12 0648    Estimated Nutritional Needs:   Kcal: 2000-2200 Protein: 75-85 gm Fluid: >2.2 L  NUTRITION DIAGNOSIS: -Inadequate oral intake (NI-2.1).  Status: Ongoing  RELATED TO: nausea, vomiting, abdominal pain  AS EVIDENCE BY: 2.8% weight loss in 3 weeks  MONITORING/EVALUATION(Goals): Goal: Pt to meet >/= 90% estimated nutritional needs via PO intake. Monitor: PO intake, weight, labs, diet order advancement, supplement acceptance  EDUCATION NEEDS: -No education needs identified at this time  INTERVENTION:  Encourage PO intake as tolerated.  Ensure Complete BID with meals.   DOCUMENTATION CODES Per approved criteria  -Obesity Unspecified    Leonette Most 04/23/2012, 9:46 AM

## 2012-04-24 ENCOUNTER — Inpatient Hospital Stay (HOSPITAL_COMMUNITY): Payer: Managed Care, Other (non HMO)

## 2012-04-24 ENCOUNTER — Encounter (HOSPITAL_COMMUNITY)
Admission: EM | Disposition: A | Discharge status: Home / Self Care | Payer: Self-pay | Source: Home / Self Care | Attending: General Surgery

## 2012-04-24 ENCOUNTER — Encounter (HOSPITAL_COMMUNITY): Payer: Self-pay | Admitting: Gastroenterology

## 2012-04-24 HISTORY — PX: ERCP: SHX5425

## 2012-04-24 LAB — BASIC METABOLIC PANEL
CO2: 25 mEq/L (ref 19–32)
Calcium: 8.1 mg/dL — ABNORMAL LOW (ref 8.4–10.5)
Creatinine, Ser: 0.48 mg/dL — ABNORMAL LOW (ref 0.50–1.10)
GFR calc non Af Amer: >90 mL/min (ref >90)
Glucose, Bld: 202 mg/dL — ABNORMAL HIGH (ref 70–99)

## 2012-04-24 LAB — GLUCOSE, CAPILLARY
Glucose-Capillary: 206 mg/dL — ABNORMAL HIGH (ref 70–99)
Glucose-Capillary: 150 mg/dL — ABNORMAL HIGH (ref 70–99)
Glucose-Capillary: 153 mg/dL — ABNORMAL HIGH (ref 70–99)
Glucose-Capillary: 172 mg/dL — ABNORMAL HIGH (ref 70–99)

## 2012-04-24 LAB — PREALBUMIN: Prealbumin: 8.5 mg/dL — ABNORMAL LOW (ref 17.0–34.0)

## 2012-04-24 SURGERY — ERCP, WITH INTERVENTION IF INDICATED
Anesthesia: Moderate Sedation

## 2012-04-24 MED ORDER — INSULIN ASPART 100 UNIT/ML ~~LOC~~ SOLN
0.0000 [IU] | SUBCUTANEOUS | Status: DC
  Administered 2012-04-24: 7 [IU] via SUBCUTANEOUS
  Administered 2012-04-24: 3 [IU] via SUBCUTANEOUS
  Administered 2012-04-24: 4 [IU] via SUBCUTANEOUS
  Administered 2012-04-25: 7 [IU] via SUBCUTANEOUS
  Administered 2012-04-25: 3 [IU] via SUBCUTANEOUS
  Administered 2012-04-25 (×2): 4 [IU] via SUBCUTANEOUS

## 2012-04-24 MED ORDER — SODIUM CHLORIDE 0.9 % IV SOLN
INTRAVENOUS | Status: DC | PRN
  Administered 2012-04-24: 17:00:00

## 2012-04-24 MED ORDER — FENTANYL CITRATE 0.05 MG/ML IJ SOLN
INTRAMUSCULAR | Status: DC | PRN
  Administered 2012-04-24 (×5): 25 ug via INTRAVENOUS

## 2012-04-24 MED ORDER — DIPHENHYDRAMINE HCL 50 MG/ML IJ SOLN
INTRAMUSCULAR | Status: DC | PRN
  Administered 2012-04-24 (×2): 25 mg via INTRAVENOUS

## 2012-04-24 MED ORDER — GLUCAGON HCL (RDNA) 1 MG IJ SOLR
INTRAMUSCULAR | Status: AC
  Filled 2012-04-24: qty 1

## 2012-04-24 MED ORDER — MIDAZOLAM HCL 10 MG/2ML IJ SOLN
INTRAMUSCULAR | Status: DC | PRN
  Administered 2012-04-24 (×5): 2 mg via INTRAVENOUS

## 2012-04-24 MED ORDER — BUTAMBEN-TETRACAINE-BENZOCAINE 2-2-14 % EX AERO
INHALATION_SPRAY | CUTANEOUS | Status: DC | PRN
  Administered 2012-04-24: 2 via TOPICAL

## 2012-04-24 MED ORDER — SODIUM CHLORIDE 0.9 % IV SOLN
INTRAVENOUS | Status: DC
  Administered 2012-04-25: 11:00:00 via INTRAVENOUS

## 2012-04-24 MED ORDER — GLUCAGON HCL (RDNA) 1 MG IJ SOLR
INTRAMUSCULAR | Status: DC | PRN
  Administered 2012-04-24: 1 mg via INTRAVENOUS

## 2012-04-24 NOTE — Care Management Note (Signed)
    Page 1 of 1   04/26/2012     10:17:56 AM   CARE MANAGEMENT NOTE 04/26/2012  Patient:  Meghan Park, Meghan Park   Account Number:  [id number]  Date Initiated:  04/24/2012  Documentation initiated by:  Takoya Jonas  Subjective/Objective Assessment:   41 yo female admitte with subhepatic fluid collection 10 days s/p lap cholectomy. PTA lived at home with spouse     Action/Plan:   Awaiting ERCP for bile leak   Anticipated DC Date:  04/27/2012   Anticipated DC Plan:  HOME/SELF CARE      DC Planning Services  CM consult      Choice offered to / List presented to:             Status of service:  Completed, signed off Medicare Important Message given?   (If response is "NO", the following Medicare IM given date fields will be blank) Date Medicare IM given:   Date Additional Medicare IM given:    Discharge Disposition:  HOME/SELF CARE  Per UR Regulation:  Reviewed for med. necessity/level of care/duration of stay  If discussed at Long Length of Stay Meetings, dates discussed:    Comments:  04-25-12 Lorenda Ishihara RN CM s/p ERCP with stent placement, d/ced home with instructions for drain care and f/u.

## 2012-04-24 NOTE — H&P (View-Only) (Signed)
Subjective: Meghan Park is a 41 y.o. female who presented to the ED with nausea, vomiting and anorexia for ~1wk.  She is s/p laparoscopic cholecystectomy.  A CT was performed that showed a subhepatic fluid collection.  This was drained in IR on Sat and contents were bilious with debris.  The fluid was sent for culture, amylase, lipase and bilirubin.    Overnight, she felt much better.  She tolerated some liquids.  This morning she has some nausea.    Objective: Vital signs in last 24 hours: Temp:  [98.5 F (36.9 C)-99.4 F (37.4 C)] 98.9 F (37.2 C) (09/29 0455) Pulse Rate:  [87-103] 87  (09/29 0455) Resp:  [13-20] 18  (09/29 0455) BP: (92-130)/(49-81) 113/69 mmHg (09/29 0455) SpO2:  [94 %-100 %] 97 % (09/29 0455) Weight:  [204 lb (92.534 kg)] 204 lb (92.534 kg) (09/28 1013)   Intake/Output from previous day: 09/28 0701 - 09/29 0700 In: 2670 [P.O.:480; I.V.:2025; IV Piggyback:150] Out: 3075 [Urine:2250; Drains:825] Intake/Output this shift:     General appearance: alert, cooperative and no distress Resp: clear to auscultation bilaterally Cardio: regular rate and rhythm GI: soft, mildly tender to palpation JP: bilious output with debris Incision: healing well  Lab Results:   Basename 04/23/12 0453 04/22/12 0145  WBC 6.4 10.8*  HGB 10.1* 13.1  HCT 29.7* 37.4  PLT 325 465*   BMET  Basename 04/23/12 0453 04/22/12 0145  NA 133* 129*  K 3.3* 4.0  CL 99 86*  CO2 27 28  GLUCOSE 216* 333*  BUN 3* 7  CREATININE 0.56 0.48*  CALCIUM 7.4* 8.6   PT/INR  Basename 04/22/12 0918  LABPROT 14.8  INR 1.18   Drain amylase: 338, Drain lipase: 2426, Drain culture and bilirubin pending  MEDS, Scheduled    . cefOXitin  2 g Intravenous Q6H  . enoxaparin  40 mg Subcutaneous Q24H  . fentaNYL      . influenza  inactive virus vaccine  0.5 mL Intramuscular Tomorrow-1000  . insulin aspart  0-20 Units Subcutaneous Q6H  . midazolam      . DISCONTD: cefOXitin  2 g Intravenous  Q6H  . DISCONTD: insulin aspart  0-20 Units Subcutaneous Q4H    Studies/Results: Ct Guided Abscess Drain  04/22/2012  *RADIOLOGY REPORT*  CT GUIDED ABSCESS DRAIN  Date: 04/22/2012  Clinical History: 41 year old female with a history of cholecystitis status post laparoscopic cholecystectomy 10 days ago. She presents with persistent nausea, vomiting and abdominal discomfort.  CT imaging demonstrates a large perihepatic fluid collection concern for possible myeloma.  She presents for percutaneous drainage.  Procedures Performed: 1. CT guided drain placement  Interventional Radiologist:  Sterling Big, MD  Sedation: Moderate (conscious) sedation was used.  Two mg Versed, 100 mcg Fentanyl were administered intravenously.  The patient's vital signs were monitored continuously by radiology nursing throughout the procedure.  Sedation Time: 35 minutes  PROCEDURE/FINDINGS:   Informed consent was obtained from the patient following explanation of the procedure, risks, benefits and alternatives. The patient understands, agrees and consents for the procedure. All questions were addressed. A time out was performed.  Maximal barrier sterile technique utilized including caps, mask, sterile gowns, sterile gloves, large sterile drape, hand hygiene, and betadine skin prep.  A planning axial CT scan was performed.  The perihepatic, possibly subcapsular fluid collection was identified.  A suitable skin entry site was selected and marked.  Local anesthesia was obtained with infiltration of 1% lidocaine.  An 18 gauge trocar needle was  then advanced under CT fluoroscopic guidance through the anterior abdominal wall and in the fluid collection.  A short Amplatz wire was directed through the collection and into the gallbladder fossa. The tract was then serially dilated to 12-French.  A 12 French Cook multipurpose drainage catheter was then modified with additional side holes.  Approximately eight additional side holes were added  along approximately 10 cm of the tube shaft.  The drain was then advanced over the Amplatz wire and the locking loop positioned in the gallbladder fossa.  The additional side holes were added to prevent loculation of the fluid collection as it drains because of its large size and somewhat irregular shape.  1500 ml of opaque tan colored fluid was then successfully aspirated.  A follow-up CT scan demonstrated near total resolution of the fluid collection.  There is some fluid anterior, and superior to the left hepatic lobe.  This should drain with upright positioning. The catheter was secured to the skin using 0-Prolene suture and plastic bumper technique.  An adhesive retention device was also employed.  The patient tolerated the procedure well, there was no immediate complication.  She was returned to the floor in stable condition.  IMPRESSION:  Successful CT guided drain placement and left perihepatic/subcapsular fluid collection.  A total of 1500 ml of non-viscous, tan colored, opaque fluid was aspirated.  The fluid was sent for culture as well as evaluation for bilirubin, amylase and lipase.  The fluid does not have a classic appearance of bile. Differential considerations include succus entericus, pancreatic fluid, and very dilute bile.  Recommended maintaining bag to gravity drainage and having the head of the bed elevated at least 30 degrees to facilitate complete drainage of the residual fluid.  These results were called by telephone on 04/22/2012 at 12:15 p.m. to Dr. Ovidio Kin, who verbally acknowledged these results.  Signed,  Sterling Big, MD Vascular & Interventional Radiologist [facility]   Original Report Authenticated By: Threasa Beards Abdomen Pelvis W Contrast  04/22/2012  *RADIOLOGY REPORT*  Clinical Data: Nausea and vomiting. Recent cholecystectomy.  CT ABDOMEN AND PELVIS WITH CONTRAST  Technique:  Multidetector CT imaging of the abdomen and pelvis was performed following the  standard protocol during bolus administration of intravenous contrast.  Contrast: OMNIPAQUE IOHEXOL 300 MG/ML  SOLN  Comparison: Abdominal ultrasound 03/21/2012  Findings: There is mild basilar atelectasis.  There is a massive air-fluid collection along the left side of the liver that extends into the gallbladder fossa.  This collection measures 13.5 x 13.9 x 16.3 cm.   This collection may be within the liver subcapsular region.  Portal venous system is patent.  There is no gross abnormality to the spleen or pancreas.  Normal appearance of the adrenal glands and kidneys.  Multiple small lymph nodes throughout the periaortic retroperitoneum.  There is minimal free fluid in the pelvis.  The left adnexa tissue is present and there may be a prominent follicle or small cyst involving the left ovary.  The uterus has been removed.  Fluid in the urinary bladder. No acute bony abnormality.  Postoperative changes in the periumbilical and anterior abdominal region.  IMPRESSION: There is a massive perihepatic air-fluid collection along the left side of the liver that extends into the gallbladder fossa. Findings are suggestive for a large abscess collection or biloma. This collection would be amendable to percutaneous drainage.  These results were called by telephone on 04/22/2012 at 06:41 a.m. to Dr. Norlene Campbell, who verbally  acknowledged these results.   Original Report Authenticated By: Richarda Overlie, M.D.     Assessment: s/p  Patient Active Problem List  Diagnosis  . Anxiety associated with depression  . DM (diabetes mellitus), type 2, uncontrolled  . Hip pain, right  . Chronic cholecystitis with calculus   Drain studies concerning for post op bile leak or enteric injury   Plan: Continue drain today, will allow to decompress for now Will change IV fluids to D5 1/2NS with KCL to address hypokalemia Malnutrition- alb 2.7, will check prealb in AM, may need supplements or TPN depending on source of leak NPO  after MN for possible further studies in AM   LOS: 1 day     .Vanita Panda, MD [facility], [place] [phone]   04/23/2012 8:29 AM

## 2012-04-24 NOTE — Op Note (Signed)
[facility] [address] [place], [address]   ERCP PROCEDURE REPORT  PATIENT: Meghan Park, Meghan L.  MR# :[id number] BIRTHDATE: 02-19-1971  GENDER: Female ENDOSCOPIST: Jeani Hawking, MD REFERRED BY: Almond Lint, M.D. PROCEDURE DATE:  04/24/2012 PROCEDURE:   ERCP with stent placement ASA CLASS:   Class III INDICATIONS:suspected or rule out a bile leak. MEDICATIONS: Fentanyl 125 mcg IV, Versed 10 mg IV, Benadryl 50 mg IV, and Glucagon 1 mg IV TOPICAL ANESTHETIC: Cetacaine Spray  DESCRIPTION OF PROCEDURE:   After the risks benefits and alternatives of the procedure were thoroughly explained, informed consent was obtained.  The Pentax ERCP [id number]  endoscope was introduced through the mouth  and advanced to the second portion of the duodenum.  The ampulla was identified and cannulation of the CBD occurred with first attempt.  The guidewire was secured in the left intrahepatic ducts.  Contrast injection revealed a nondilated CBD, but there was extravasation of contrast around the staples consistent with cystic duct leak.  An attempt to place an 8.5 Fr x 5 cm stent failed as the CBD was too narrow.  A 7 Fr x 5 cm stent was then employed and it was able to be placed into the duct with excellent bile drainage.  Fluroscopic images were obtained and the procedure was terminated without any immediate complications. Th     COMPLICATIONS: None.  ENDOSCOPIC IMPRESSION: The biliary tree appeared to have a bile leak.  RECOMMENDATIONS: 1) Continue antibiotics. 2) Monitor external drainage output. 3) Repeat ERCP in 1-3 months to remove the stent.     _______________________________ eSigned:  Jeani Hawking, MD 04/24/2012 5:14 PM   FA:[id number] Donell Beers, MD

## 2012-04-24 NOTE — Progress Notes (Signed)
Inpatient Diabetes Program Recommendations  AACE/ADA: New Consensus Statement on Inpatient Glycemic Control (2013)  Target Ranges:  Prepandial:   less than 140 mg/dL      Peak postprandial:   less than 180 mg/dL (1-2 hours)      Critically ill patients:  140 - 180 mg/dL   Reason for Visit: Hyperglycemia-- sub-optimal glucemic control  Inpatient Diabetes Program Recommendations Insulin - Basal: On no basal insulin Correction (SSI): Currently on Novolog resistant correction scale Oral Agents: Took oral agents prior to admissiion.  Not appropriate for use while acutely ill and NPO for procedure, etc. Diet: NPO  Note:  Results for LEIRA, REGINO (MRN [id number]) as of 04/24/2012 13:36  Ref. Range 04/22/2012 23:52 04/23/2012 03:48 04/23/2012 11:34 04/23/2012 17:13 04/24/2012 00:01 04/24/2012 03:10 04/24/2012 07:32 04/24/2012 11:49  Glucose-Capillary Latest Range: 70-99 mg/dL 119 (H) 147 (H) 829 (H) 229 (H) 192 (H) 182 (H) 206 (H) 150 (H)   Yesterday received a total of 29 units Novolog with most CBG's out of target range.  Thus far today has received 18 units of Novolog correction with only one CBG within target range.  Request MD consider starting basal insulin-- either Levemir or Lantus insulin 18 units, to help improve glycemic control.  Thank you. Sydnee Lamour S. Elsie Lincoln, RN, CNS, CDE  [phone])

## 2012-04-24 NOTE — Progress Notes (Signed)
Subjective: Pt feeling better; much less nausea, no vomiting today; mild soreness at drain site  Objective: Vital signs in last 24 hours: Temp:  [98 F (36.7 C)-99 F (37.2 C)] 98.4 F (36.9 C) (09/30 0400) Pulse Rate:  [83-101] 83  (09/30 0400) Resp:  [18] 18  (09/30 0400) BP: (101-114)/(69-82) 114/77 mmHg (09/30 0400) SpO2:  [97 %-99 %] 99 % (09/30 0400) Last BM Date: 04/22/12  Intake/Output from previous day: 09/29 0701 - 09/30 0700 In: 2136.7 [I.V.:1921.7; IV Piggyback:200] Out: 2315 [Urine:2200; Drains:115] Intake/Output this shift:    abd drain intact, insertion site ok, mildly tender, output 115 cc's today yellow fluid with fibrous tissue fragments, cx's pend, drain flushed with 5 cc's sterile NS with minimal return  Lab Results:   [place] 04/23/12 0453 04/22/12 0145  WBC 6.4 10.8*  HGB 10.1* 13.1  HCT 29.7* 37.4  PLT 325 465*   BMET  Basename 04/24/12 0351 04/23/12 0453  NA 135 133*  K 3.4* 3.3*  CL 100 99  CO2 25 27  GLUCOSE 202* 216*  BUN <3* 3*  CREATININE 0.48* 0.56  CALCIUM 8.1* 7.4*   PT/INR  Basename 04/22/12 0918  LABPROT 14.8  INR 1.18   ABG No results found for this basename: PHART:2,PCO2:2,PO2:2,HCO3:2 in the last 72 hours Results for orders placed during the hospital encounter of 04/22/12  CULTURE, ROUTINE-ABSCESS     Status: Normal (Preliminary result)   Collection Time   04/22/12 12:30 PM      Component Value Range Status Comment   Specimen Description PERITONEAL CAVITY PERIHEPATIC FLUID   Final    Special Requests Normal   Final    Gram Stain PENDING   Incomplete    Culture Culture reincubated for better growth   Final    Report Status PENDING   Incomplete   ANAEROBIC CULTURE     Status: Normal (Preliminary result)   Collection Time   04/22/12 12:30 PM      Component Value Range Status Comment   Specimen Description PERITONEAL CAVITY PERIHEPATIC FLUID   Final    Special Requests Normal   Final    Gram Stain PENDING    Incomplete    Culture     Final    Value: NO ANAEROBES ISOLATED; CULTURE IN PROGRESS FOR 5 DAYS   Report Status PENDING   Incomplete     Studies/Results: Ct Guided Abscess Drain  04/22/2012  *RADIOLOGY REPORT*  CT GUIDED ABSCESS DRAIN  Date: 04/22/2012  Clinical History: 41 year old female with a history of cholecystitis status post laparoscopic cholecystectomy 10 days ago. She presents with persistent nausea, vomiting and abdominal discomfort.  CT imaging demonstrates a large perihepatic fluid collection concern for possible myeloma.  She presents for percutaneous drainage.  Procedures Performed: 1. CT guided drain placement  Interventional Radiologist:  Sterling Big, MD  Sedation: Moderate (conscious) sedation was used.  Two mg Versed, 100 mcg Fentanyl were administered intravenously.  The patient's vital signs were monitored continuously by radiology nursing throughout the procedure.  Sedation Time: 35 minutes  PROCEDURE/FINDINGS:   Informed consent was obtained from the patient following explanation of the procedure, risks, benefits and alternatives. The patient understands, agrees and consents for the procedure. All questions were addressed. A time out was performed.  Maximal barrier sterile technique utilized including caps, mask, sterile gowns, sterile gloves, large sterile drape, hand hygiene, and betadine skin prep.  A planning axial CT scan was performed.  The perihepatic, possibly subcapsular fluid collection was identified.  A suitable skin entry site was selected and marked.  Local anesthesia was obtained with infiltration of 1% lidocaine.  An 18 gauge trocar needle was then advanced under CT fluoroscopic guidance through the anterior abdominal wall and in the fluid collection.  A short Amplatz wire was directed through the collection and into the gallbladder fossa. The tract was then serially dilated to 12-French.  A 12 French Cook multipurpose drainage catheter was then modified with  additional side holes.  Approximately eight additional side holes were added along approximately 10 cm of the tube shaft.  The drain was then advanced over the Amplatz wire and the locking loop positioned in the gallbladder fossa.  The additional side holes were added to prevent loculation of the fluid collection as it drains because of its large size and somewhat irregular shape.  1500 ml of opaque tan colored fluid was then successfully aspirated.  A follow-up CT scan demonstrated near total resolution of the fluid collection.  There is some fluid anterior, and superior to the left hepatic lobe.  This should drain with upright positioning. The catheter was secured to the skin using 0-Prolene suture and plastic bumper technique.  An adhesive retention device was also employed.  The patient tolerated the procedure well, there was no immediate complication.  She was returned to the floor in stable condition.  IMPRESSION:  Successful CT guided drain placement and left perihepatic/subcapsular fluid collection.  A total of 1500 ml of non-viscous, tan colored, opaque fluid was aspirated.  The fluid was sent for culture as well as evaluation for bilirubin, amylase and lipase.  The fluid does not have a classic appearance of bile. Differential considerations include succus entericus, pancreatic fluid, and very dilute bile.  Recommended maintaining bag to gravity drainage and having the head of the bed elevated at least 30 degrees to facilitate complete drainage of the residual fluid.  These results were called by telephone on 04/22/2012 at 12:15 p.m. to Dr. Ovidio Kin, who verbally acknowledged these results.  Signed,  Sterling Big, MD Vascular & Interventional Radiologist [facility]   Original Report Authenticated By: Vilma Prader     Anti-infectives: Anti-infectives     Start     Dose/Rate Route Frequency Ordered Stop   04/22/12 1500   cefOXitin (MEFOXIN) 2 g in dextrose 5 % 50 mL IVPB        2 g 100  mL/hr over 30 Minutes Intravenous 4 times per day 04/22/12 1206     04/22/12 0830   cefOXitin (MEFOXIN) 2 g in dextrose 5 % 50 mL IVPB  Status:  Discontinued        2 g 100 mL/hr over 30 Minutes Intravenous 4 times per day 04/22/12 0816 04/22/12 1206          Assessment/Plan: s/p left perihepatic/subcapsular fluid collection drainage 9/28; check final cx's; tent for ERCP today per [facility] note   LOS: 2 days    ALLRED,D [facility] 04/24/2012

## 2012-04-24 NOTE — Progress Notes (Signed)
Patient ID: Meghan Park, female   DOB: 1971/02/21, 41 y.o.   MRN: [id number]    Subjective: Meghan Park is a 41 y.o. female   Feeling better with drain in.    Objective: Vital signs in last 24 hours: Temp:  [98 F (36.7 C)-99 F (37.2 C)] 98.4 F (36.9 C) (09/30 0400) Pulse Rate:  [83-101] 83  (09/30 0400) Resp:  [18] 18  (09/30 0400) BP: (101-114)/(69-82) 114/77 mmHg (09/30 0400) SpO2:  [97 %-99 %] 99 % (09/30 0400)   Intake/Output from previous day: 09/29 0701 - 09/30 0700 In: 2136.7 [I.V.:1921.7; IV Piggyback:200] Out: 2315 [Urine:2200; Drains:115] Intake/Output this shift:     General appearance: alert, cooperative and no distress Resp: clear to auscultation bilaterally Cardio: regular rate and rhythm GI: soft, mildly tender to palpation JP: bilious output with debris Incision: healing well  Lab Results:   Basename 04/23/12 0453 04/22/12 0145  WBC 6.4 10.8*  HGB 10.1* 13.1  HCT 29.7* 37.4  PLT 325 465*   BMET  Basename 04/24/12 0351 04/23/12 0453  NA 135 133*  K 3.4* 3.3*  CL 100 99  CO2 25 27  GLUCOSE 202* 216*  BUN <3* 3*  CREATININE 0.48* 0.56  CALCIUM 8.1* 7.4*   PT/INR  Basename 04/22/12 0918  LABPROT 14.8  INR 1.18   Drain amylase: 338, Drain lipase: 2426, Drain culture and bilirubin pending  MEDS, Scheduled    . cefOXitin  2 g Intravenous Q6H  . clonazePAM  1 mg Oral Daily  . enoxaparin  40 mg Subcutaneous Q24H  . feeding supplement  237 mL Oral BID WC  . influenza  inactive virus vaccine  0.5 mL Intramuscular Tomorrow-1000  . insulin aspart  0-20 Units Subcutaneous Q4H  . sertraline  100 mg Oral Daily  . DISCONTD: insulin aspart  0-20 Units Subcutaneous Q6H    Studies/Results: Ct Guided Abscess Drain  04/22/2012  *RADIOLOGY REPORT*  CT GUIDED ABSCESS DRAIN  Date: 04/22/2012  Clinical History: 41 year old female with a history of cholecystitis status post laparoscopic cholecystectomy 10 days ago. She presents with persistent  nausea, vomiting and abdominal discomfort.  CT imaging demonstrates a large perihepatic fluid collection concern for possible myeloma.  She presents for percutaneous drainage.  Procedures Performed: 1. CT guided drain placement  Interventional Radiologist:  Sterling Big, MD  Sedation: Moderate (conscious) sedation was used.  Two mg Versed, 100 mcg Fentanyl were administered intravenously.  The patient's vital signs were monitored continuously by radiology nursing throughout the procedure.  Sedation Time: 35 minutes  PROCEDURE/FINDINGS:   Informed consent was obtained from the patient following explanation of the procedure, risks, benefits and alternatives. The patient understands, agrees and consents for the procedure. All questions were addressed. A time out was performed.  Maximal barrier sterile technique utilized including caps, mask, sterile gowns, sterile gloves, large sterile drape, hand hygiene, and betadine skin prep.  A planning axial CT scan was performed.  The perihepatic, possibly subcapsular fluid collection was identified.  A suitable skin entry site was selected and marked.  Local anesthesia was obtained with infiltration of 1% lidocaine.  An 18 gauge trocar needle was then advanced under CT fluoroscopic guidance through the anterior abdominal wall and in the fluid collection.  A short Amplatz wire was directed through the collection and into the gallbladder fossa. The tract was then serially dilated to 12-French.  A 12 French Cook multipurpose drainage catheter was then modified with additional side holes.  Approximately eight additional  side holes were added along approximately 10 cm of the tube shaft.  The drain was then advanced over the Amplatz wire and the locking loop positioned in the gallbladder fossa.  The additional side holes were added to prevent loculation of the fluid collection as it drains because of its large size and somewhat irregular shape.  1500 ml of opaque tan colored  fluid was then successfully aspirated.  A follow-up CT scan demonstrated near total resolution of the fluid collection.  There is some fluid anterior, and superior to the left hepatic lobe.  This should drain with upright positioning. The catheter was secured to the skin using 0-Prolene suture and plastic bumper technique.  An adhesive retention device was also employed.  The patient tolerated the procedure well, there was no immediate complication.  She was returned to the floor in stable condition.  IMPRESSION:  Successful CT guided drain placement and left perihepatic/subcapsular fluid collection.  A total of 1500 ml of non-viscous, tan colored, opaque fluid was aspirated.  The fluid was sent for culture as well as evaluation for bilirubin, amylase and lipase.  The fluid does not have a classic appearance of bile. Differential considerations include succus entericus, pancreatic fluid, and very dilute bile.  Recommended maintaining bag to gravity drainage and having the head of the bed elevated at least 30 degrees to facilitate complete drainage of the residual fluid.  These results were called by telephone on 04/22/2012 at 12:15 p.m. to Dr. Ovidio Kin, who verbally acknowledged these results.  Signed,  Sterling Big, MD Vascular & Interventional Radiologist [facility]   Original Report Authenticated By: Vilma Prader     Assessment: s/p  Patient Active Problem List  Diagnosis  . Anxiety associated with depression  . DM (diabetes mellitus), type 2, uncontrolled  . Hip pain, right  . Chronic cholecystitis with calculus   Drain studies concerning for post op bile leak or enteric injury   Plan: ERCP today. D/W Dr. Loreta Ave   LOS: 2 days      04/24/2012 7:54 AM

## 2012-04-24 NOTE — Interval H&P Note (Signed)
History and Physical Interval Note:  04/24/2012 4:23 PM  Meghan Park  has presented today for surgery, with the diagnosis of Bile leak  The various methods of treatment have been discussed with the patient and family. After consideration of risks, benefits and other options for treatment, the patient has consented to  Procedure(s) (LRB) with comments: ENDOSCOPIC RETROGRADE CHOLANGIOPANCREATOGRAPHY (ERCP) (N/A) as a surgical intervention .  The patient's history has been reviewed, patient examined, no change in status, stable for surgery.  I have reviewed the patient's chart and labs.  Questions were answered to the patient's satisfaction.     [name] D

## 2012-04-24 NOTE — Consult Note (Signed)
Reason for Consult: Bile leak Referring Physician: Almond Lint, M.Park.  Meghan Park HPI: This is a 41 year old female who is s/p lap chole for symptomatic gallstones on 04/13/2012 who developed a bile leak.  The patient continued to have pain after the procedure and further work up revealed large perihepatic air around the liver and a fluid collection.  The fluid was drained over the weekend and it appeared to be bile in nature.  As a result of her symptoms a GI consultation was requested for and ERCP.  Past Medical History  Diagnosis Date  . DM (diabetes mellitus), type 2, uncontrolled   . Anxiety associated with depression   . Hip pain, right     Past Surgical History  Procedure Date  . Cesarean section     x2  . Abdominal hysterectomy 2012  . Cholecystectomy     Family History  Problem Relation Age of Onset  . Anxiety disorder Mother   . Arthritis Father   . Heart disease Maternal Grandfather     Social History:  reports that she has been smoking Cigarettes.  She has a 10 pack-year smoking history. She does not have any smokeless tobacco history on file. She reports that she does not drink alcohol or use illicit drugs.  Allergies: No Known Allergies  Medications:  Scheduled:   . cefOXitin  2 g Intravenous Q6H  . clonazePAM  1 mg Oral Daily  . enoxaparin  40 mg Subcutaneous Q24H  . feeding supplement  237 mL Oral BID WC  . insulin aspart  0-20 Units Subcutaneous Q4H  . sertraline  100 mg Oral Daily  . DISCONTD: insulin aspart  0-20 Units Subcutaneous Q6H   Continuous:   . dextrose 5 % and 0.45 % NaCl with KCl 40 mEq/L 75 mL/hr (04/23/12 1141)    Results for orders placed during the hospital encounter of 04/22/12 (from the past 24 hour(s))  GLUCOSE, CAPILLARY     Status: Abnormal   Collection Time   04/23/12  5:13 PM      Component Value Range   Glucose-Capillary 229 (*) 70 - 99 mg/dL  GLUCOSE, CAPILLARY     Status: Abnormal   Collection Time   04/24/12 12:01 AM       Component Value Range   Glucose-Capillary 192 (*) 70 - 99 mg/dL  GLUCOSE, CAPILLARY     Status: Abnormal   Collection Time   04/24/12  3:10 AM      Component Value Range   Glucose-Capillary 182 (*) 70 - 99 mg/dL  BASIC METABOLIC PANEL     Status: Abnormal   Collection Time   04/24/12  3:51 AM      Component Value Range   Sodium 135  135 - 145 mEq/L   Potassium 3.4 (*) 3.5 - 5.1 mEq/L   Chloride 100  96 - 112 mEq/L   CO2 25  19 - 32 mEq/L   Glucose, Bld 202 (*) 70 - 99 mg/dL   BUN <3 (*) 6 - 23 mg/dL   Creatinine, Ser 1.61 (*) 0.50 - 1.10 mg/dL   Calcium 8.1 (*) 8.4 - 10.5 mg/dL   GFR calc non Af Amer >90  >90 mL/min   GFR calc Af Amer >90  >90 mL/min  GLUCOSE, CAPILLARY     Status: Abnormal   Collection Time   04/24/12  7:32 AM      Component Value Range   Glucose-Capillary 206 (*) 70 - 99 mg/dL  GLUCOSE,  CAPILLARY     Status: Abnormal   Collection Time   04/24/12 11:49 AM      Component Value Range   Glucose-Capillary 150 (*) 70 - 99 mg/dL     No results found.  ROS:  As stated above in the HPI otherwise negative.  Blood pressure 104/55, pulse 86, temperature 98.2 F (36.8 C), temperature source Oral, resp. rate 20, height 5\' 2"  (1.575 m), weight 92.534 kg (204 lb), SpO2 99.00%.    PE: Gen: NAD, Alert and Oriented HEENT:  Opal/AT, EOMI Neck: Supple, no LAD Lungs: CTA Bilaterally CV: RRR without M/G/R ABM: Soft, NTND, +BS, RUQ drainage catheter Ext: No C/C/E  Assessment/Plan: 1) Probable bile leak.  Plan: 1) ERCP with possible stent placement.  Meghan Park 04/24/2012, 12:34 PM

## 2012-04-25 ENCOUNTER — Encounter (HOSPITAL_COMMUNITY): Payer: Self-pay

## 2012-04-25 ENCOUNTER — Encounter (HOSPITAL_COMMUNITY)
Admission: EM | Disposition: A | Discharge status: Home / Self Care | Payer: Self-pay | Source: Home / Self Care | Attending: General Surgery

## 2012-04-25 LAB — COMPREHENSIVE METABOLIC PANEL
ALT: 9 U/L (ref 0–35)
AST: 15 U/L (ref 0–37)
CO2: 25 mEq/L (ref 19–32)
Calcium: 8.1 mg/dL — ABNORMAL LOW (ref 8.4–10.5)
Creatinine, Ser: 0.5 mg/dL (ref 0.50–1.10)
GFR calc Af Amer: >90 mL/min (ref >90)
GFR calc non Af Amer: >90 mL/min (ref >90)
Glucose, Bld: 174 mg/dL — ABNORMAL HIGH (ref 70–99)
Sodium: 136 mEq/L (ref 135–145)
Total Protein: 5.9 g/dL — ABNORMAL LOW (ref 6.0–8.3)

## 2012-04-25 LAB — CBC
MCH: 28.5 pg (ref 26.0–34.0)
MCHC: 33.2 g/dL (ref 30.0–36.0)
MCV: 85.9 fL (ref 78.0–100.0)
Platelets: 326 10*3/uL (ref 150–400)
RBC: 3.54 MIL/uL — ABNORMAL LOW (ref 3.87–5.11)

## 2012-04-25 LAB — GLUCOSE, CAPILLARY: Glucose-Capillary: 150 mg/dL — ABNORMAL HIGH (ref 70–99)

## 2012-04-25 SURGERY — ERCP, WITH INTERVENTION IF INDICATED
Anesthesia: Moderate Sedation

## 2012-04-25 MED ORDER — AMOXICILLIN-POT CLAVULANATE 875-125 MG PO TABS
1.0000 | ORAL_TABLET | Freq: Two times a day (BID) | ORAL | Status: DC
  Administered 2012-04-25: 1 via ORAL
  Filled 2012-04-25 (×2): qty 1

## 2012-04-25 MED ORDER — AMOXICILLIN-POT CLAVULANATE 875-125 MG PO TABS: 1.0000 | ORAL_TABLET | Freq: Two times a day (BID) | ORAL | Status: DC

## 2012-04-25 NOTE — Progress Notes (Signed)
Subjective: Feeling fine.  No complaints after the procedure.  Objective: Vital signs in last 24 hours: Temp:  [98 F (36.7 C)-99.8 F (37.7 C)] 98.1 F (36.7 C) (10/01 0536) Pulse Rate:  [79-86] 79  (10/01 0536) Resp:  [14-30] 16  (10/01 0536) BP: (83-183)/(20-107) 101/70 mmHg (10/01 0536) SpO2:  [90 %-100 %] 98 % (10/01 0536) Last BM Date: 04/24/12  Intake/Output from previous day: 09/30 0701 - 10/01 0700 In: 1126 [P.O.:360; I.V.:711; IV Piggyback:50] Out: 935 [Urine:900; Drains:35] Intake/Output this shift:    General appearance: alert and no distress GI: mild tenderness at the catheter site  Lab Results:  Basename 04/25/12 0400 04/23/12 0453  WBC 8.6 6.4  HGB 10.1* 10.1*  HCT 30.4* 29.7*  PLT 326 325   BMET  Basename 04/25/12 0400 04/24/12 0351 04/23/12 0453  NA 136 135 133*  K 3.9 3.4* 3.3*  CL 102 100 99  CO2 25 25 27   GLUCOSE 174* 202* 216*  BUN 3* <3* 3*  CREATININE 0.50 0.48* 0.56  CALCIUM 8.1* 8.1* 7.4*   LFT  Basename 04/25/12 0400  PROT 5.9*  ALBUMIN 2.0*  AST 15  ALT 9  ALKPHOS 106  BILITOT 0.4  BILIDIR --  IBILI --   PT/INR  Basename 04/22/12 0918  LABPROT 14.8  INR 1.18   Hepatitis Panel No results found for this basename: HEPBSAG,HCVAB,HEPAIGM,HEPBIGM in the last 72 hours C-Diff No results found for this basename: CDIFFTOX:3 in the last 72 hours Fecal Lactopherrin No results found for this basename: FECLLACTOFRN in the last 72 hours  Studies/Results: Dg Ercp With Sphincterotomy  04/24/2012  *RADIOLOGY REPORT*  Clinical Data:  Status post percutaneous drainage of a biloma following cholecystectomy.  ERCP WITH SPHINCTEROTOMY  Fluoroscopy Time: 0.9 minutes  Comparison: CT dated 04/22/2012.  Findings:  The examination was performed by Dr. Elnoria Howard with no radiologist in attendance.  Serial spot fluoroscopic images of the right upper quadrant of the abdomen demonstrate an endoscope in place and retrograde opacification of the common duct  and intrahepatic ducts.  There is extravasation of contrast into the gallbladder fossa from the cystic duct remnant.  Cholecystectomy clips are in place.  A pigtail catheter is also in place.  The last image demonstrates a common duct stent which appears to be in satisfactory position.  The distal portion of the stent is poorly visualized due to motion blurring.  IMPRESSION:  1.  Bile leak from the cystic duct remnant. 2.  Common duct stent placement.   Original Report Authenticated By: Darrol Angel, M.D.     Medications:  Scheduled:   . cefOXitin  2 g Intravenous Q6H  . clonazePAM  1 mg Oral Daily  . enoxaparin  40 mg Subcutaneous Q24H  . feeding supplement  237 mL Oral BID WC  . insulin aspart  0-20 Units Subcutaneous Q4H  . sertraline  100 mg Oral Daily   Continuous:   . sodium chloride    . dextrose 5 % and 0.45 % NaCl with KCl 40 mEq/L 75 mL/hr (04/23/12 1141)    Assessment/Plan: 1) Bile leak s/p 7 Fr x 5 cm biliary stent placement.   I am unable to discern if her external catheter drainage decreased overnight.  Hopefully it will become more apparent over the interval days.    Plan: 1) Continue with supportive care per surgery. 2) Removal of the stent once her acute issues have resolved, 1-3 months.  LOS: 3 days   Ramiro Pangilinan D 04/25/2012, 7:42 AM

## 2012-04-25 NOTE — Progress Notes (Signed)
Spoke to [employer] about when to flush drain at home, states to call IR and do whatever flush regimen the recommend. Spoke to IR states to do 5ml bid. Patient and significant other that she states will be assisting with drain at home in room. Instructed and demonstration of emptying drain, flushing drain, and changing drsg. States understanding, asked for return demonstration states they understand.

## 2012-04-25 NOTE — Progress Notes (Signed)
  Patient ID: Meghan Park, female   DOB: 1971-03-27, 41 y.o.   MRN: [id number] 1 Day Post-Op  Subjective: Meghan Park is a 41 y.o. female   Minimal pain and nausea.    Objective: Vital signs in last 24 hours: Temp:  [98 F (36.7 C)-99.8 F (37.7 C)] 98.1 F (36.7 C) (10/01 0536) Pulse Rate:  [79-86] 79  (10/01 0536) Resp:  [14-30] 16  (10/01 0536) BP: (83-183)/(20-107) 101/70 mmHg (10/01 0536) SpO2:  [90 %-100 %] 98 % (10/01 0536)   Intake/Output from previous day: 09/30 0701 - 10/01 0700 In: 1126 [P.O.:360; I.V.:711; IV Piggyback:50] Out: 935 [Urine:900; Drains:35] Intake/Output this shift:     General appearance: alert, cooperative and no distress Resp: clear to auscultation bilaterally Cardio: regular rate and rhythm GI: soft, mildly tender to palpation JP: bilious output with debris Incision: healing well  Lab Results:   Basename 04/25/12 0400 04/23/12 0453  WBC 8.6 6.4  HGB 10.1* 10.1*  HCT 30.4* 29.7*  PLT 326 325   BMET  Basename 04/25/12 0400 04/24/12 0351  NA 136 135  K 3.9 3.4*  CL 102 100  CO2 25 25  GLUCOSE 174* 202*  BUN 3* <3*  CREATININE 0.50 0.48*  CALCIUM 8.1* 8.1*   PT/INR  Basename 04/22/12 0918  LABPROT 14.8  INR 1.18    MEDS, Scheduled    . amoxicillin-clavulanate  1 tablet Oral Q12H  . clonazePAM  1 mg Oral Daily  . enoxaparin  40 mg Subcutaneous Q24H  . feeding supplement  237 mL Oral BID WC  . insulin aspart  0-20 Units Subcutaneous Q4H  . sertraline  100 mg Oral Daily  . DISCONTD: cefOXitin  2 g Intravenous Q6H    Studies/Results: Dg Ercp With Sphincterotomy  04/24/2012  *RADIOLOGY REPORT*  Clinical Data:  Status post percutaneous drainage of a biloma following cholecystectomy.  ERCP WITH SPHINCTEROTOMY  Fluoroscopy Time: 0.9 minutes  Comparison: CT dated 04/22/2012.  Findings:  The examination was performed by Dr. Elnoria Howard with no radiologist in attendance.  Serial spot fluoroscopic images of the right upper quadrant of  the abdomen demonstrate an endoscope in place and retrograde opacification of the common duct and intrahepatic ducts.  There is extravasation of contrast into the gallbladder fossa from the cystic duct remnant.  Cholecystectomy clips are in place.  A pigtail catheter is also in place.  The last image demonstrates a common duct stent which appears to be in satisfactory position.  The distal portion of the stent is poorly visualized due to motion blurring.  IMPRESSION:  1.  Bile leak from the cystic duct remnant. 2.  Common duct stent placement.   Original Report Authenticated By: Meghan Park, M.D.     Assessment: s/p  Patient Active Problem List  Diagnosis  . Anxiety associated with depression  . DM (diabetes mellitus), type 2, uncontrolled  . Hip pain, right  . Chronic cholecystitis with calculus   Bile leak.  ERCP performed.     Plan: Advance diet today. Hopefully home later today.   LOS: 3 days      04/25/2012 8:41 AM

## 2012-04-25 NOTE — Progress Notes (Signed)
Discharged via wheelchair. States understanding of discharge instructions and drain care. Iv removed.

## 2012-04-25 NOTE — Progress Notes (Signed)
1 Day Post-Op  Subjective: Biloma drain placed 9/28 ERCP with CBD stent placed 9/30 Feeling better Drain intact  Objective: Vital signs in last 24 hours: Temp:  [98 F (36.7 C)-99.8 F (37.7 C)] 98.1 F (36.7 C) (10/01 0536) Pulse Rate:  [79-86] 79  (10/01 0536) Resp:  [14-30] 16  (10/01 0536) BP: (83-183)/(20-107) 101/70 mmHg (10/01 0536) SpO2:  [90 %-100 %] 98 % (10/01 0536) Last BM Date: 04/24/12  Intake/Output from previous day: 09/30 0701 - 10/01 0700 In: 1126 [P.O.:360; I.V.:711; IV Piggyback:50] Out: 935 [Urine:900; Drains:35] Intake/Output this shift:    PE: biloma drain intact Output 35 cc yesterday 100 cc in bag now: bile  Site is NT; no bleeding Clean and dry Cxs no growth Afeb; VSS Wbc: 8.6  Lab Results:   Basename 04/25/12 0400 04/23/12 0453  WBC 8.6 6.4  HGB 10.1* 10.1*  HCT 30.4* 29.7*  PLT 326 325   BMET  Basename 04/25/12 0400 04/24/12 0351  NA 136 135  K 3.9 3.4*  CL 102 100  CO2 25 25  GLUCOSE 174* 202*  BUN 3* <3*  CREATININE 0.50 0.48*  CALCIUM 8.1* 8.1*   PT/INR  Basename 04/22/12 0918  LABPROT 14.8  INR 1.18   ABG No results found for this basename: PHART:2,PCO2:2,PO2:2,HCO3:2 in the last 72 hours  Studies/Results: Dg Ercp With Sphincterotomy  04/24/2012  *RADIOLOGY REPORT*  Clinical Data:  Status post percutaneous drainage of Park biloma following cholecystectomy.  ERCP WITH SPHINCTEROTOMY  Fluoroscopy Time: 0.9 minutes  Comparison: CT dated 04/22/2012.  Findings:  The examination was performed by Dr. Elnoria Howard with no radiologist in attendance.  Serial spot fluoroscopic images of the right upper quadrant of the abdomen demonstrate an endoscope in place and retrograde opacification of the common duct and intrahepatic ducts.  There is extravasation of contrast into the gallbladder fossa from the cystic duct remnant.  Cholecystectomy clips are in place.  Park pigtail catheter is also in place.  The last image demonstrates Park common duct  stent which appears to be in satisfactory position.  The distal portion of the stent is poorly visualized due to motion blurring.  IMPRESSION:  1.  Bile leak from the cystic duct remnant. 2.  Common duct stent placement.   Original Report Authenticated By: Darrol Angel, M.D.     Anti-infectives: Anti-infectives     Start     Dose/Rate Route Frequency Ordered Stop   04/25/12 1000  amoxicillin-clavulanate (AUGMENTIN) 875-125 MG per tablet 1 tablet       1 tablet Oral Every 12 hours 04/25/12 0839     04/25/12 0000  amoxicillin-clavulanate (AUGMENTIN) 875-125 MG per tablet       1 tablet Oral Every 12 hours 04/25/12 0840     04/22/12 1500   cefOXitin (MEFOXIN) 2 g in dextrose 5 % 50 mL IVPB  Status:  Discontinued        2 g 100 mL/hr over 30 Minutes Intravenous 4 times per day 04/22/12 1206 04/25/12 0839   04/22/12 0830   cefOXitin (MEFOXIN) 2 g in dextrose 5 % 50 mL IVPB  Status:  Discontinued        2 g 100 mL/hr over 30 Minutes Intravenous 4 times per day 04/22/12 0816 04/22/12 1206          Assessment/Plan: s/p Procedure(s) (LRB) with comments: ENDOSCOPIC RETROGRADE CHOLANGIOPANCREATOGRAPHY (ERCP) (N/Park)  Biloma drain  Placed 9/28 ERCP and CBD stent placed 9/30 Pt better Output significant Plan per Dr Donell Beers  Meghan Park 04/25/2012

## 2012-04-27 LAB — ANAEROBIC CULTURE
Gram Stain: NONE SEEN
Special Requests: NORMAL

## 2012-04-27 LAB — CULTURE, ROUTINE-ABSCESS

## 2012-04-28 ENCOUNTER — Telehealth: Payer: Self-pay

## 2012-04-28 ENCOUNTER — Telehealth (INDEPENDENT_AMBULATORY_CARE_PROVIDER_SITE_OTHER): Payer: Self-pay | Admitting: General Surgery

## 2012-04-28 NOTE — Telephone Encounter (Signed)
Pt is requesting a partial refill on clonazePAM (KLONOPIN) 1 MG tablet She was released from the hospital on Tuesday and cannot come into the office [facility] [place] [address]   CBN [phone]

## 2012-04-28 NOTE — Telephone Encounter (Signed)
Patient states she did not get the Rx on 04/10/12 I left message for the pharmacy to see if they got it, if not I can call this to them. Meghan Park

## 2012-04-28 NOTE — Telephone Encounter (Signed)
The patient was given 30 tabs on 04/10/2012 with instructions to take 1/2-1 tab per day.  If taken as instructed, that supply should last 1-2 months.

## 2012-04-28 NOTE — Telephone Encounter (Signed)
Pt called to ask where she could purchase more pre-filled normal saline syringes/ she only has a few left from hospital discharge/ I located a Pharmacy in [place] [address] [phone] and called request in for 1 box of 10cc pre-filled syringes #30/ Pt aware/gy

## 2012-04-29 ENCOUNTER — Telehealth: Payer: Self-pay

## 2012-04-29 NOTE — Telephone Encounter (Signed)
Can she have a refill ? 

## 2012-04-29 NOTE — Telephone Encounter (Signed)
Pt calling to say pharmacy says they have not received any feed back regarding her colonopin   [phone]

## 2012-04-30 NOTE — Telephone Encounter (Signed)
See phone message from 10/4 - we were going to check with pharmacy to see if rx had been sent. If not we were to call it in. If it was called in on 10/4 then she is too early, but if it still has not been done, we can refill.

## 2012-05-01 MED ORDER — CLONAZEPAM 1 MG PO TABS: ORAL_TABLET | ORAL | Status: DC

## 2012-05-01 NOTE — Telephone Encounter (Signed)
Spoke w/pharmacist at [facility] who verified that pt has not gotten her clonazepam RFd since 03/10/12, and they do not have a record of the one we sent in on 04/10/12. Gave pharmacist OK to RF pt's clonazepam 1 mg #30 x 1. Notified pt who stated that she should be able to RTC w/in a couple of weeks for f/up.

## 2012-05-01 NOTE — Telephone Encounter (Signed)
Pt would like at least partial refill on clonopin.  She recently had surgery and cannot come in at this time.    [facility] [facility] [facility] [place] [phone]

## 2012-05-08 ENCOUNTER — Encounter (INDEPENDENT_AMBULATORY_CARE_PROVIDER_SITE_OTHER): Payer: Self-pay | Admitting: General Surgery

## 2012-05-08 ENCOUNTER — Ambulatory Visit (INDEPENDENT_AMBULATORY_CARE_PROVIDER_SITE_OTHER): Payer: Managed Care, Other (non HMO) | Admitting: General Surgery

## 2012-05-08 VITALS — BP 110/72 | HR 110 | Temp 98.3°F | Resp 18 | Ht 62.0 in | Wt 199.8 lb

## 2012-05-08 DIAGNOSIS — K8010 Calculus of gallbladder with chronic cholecystitis without obstruction: Secondary | ICD-10-CM

## 2012-05-08 NOTE — Progress Notes (Signed)
HISTORY: Patient is a 41 year old female status post laparoscopic cholecystectomy 3 weeks ago. Her course was complicated by a bile leak. She was admitted and had a percutaneous drain placed. She also underwent ERCP with stent placement. Her drain output has been less than 1 cc for 4 days now.  The highest it has been in a week was 5 cc/day.  Her symptoms have resolved. She is no longer having nausea or vomiting. She is able to eat. She denies fevers and chills. Her bloating has resolved.    EXAM: General:  Alert and oriented.  No distress Incision:  Some scabbing over epigastric incisions.  Drain removed.     PATHOLOGY: Gallbladder - CHRONIC CHOLECYSTITIS. - CHOLELITHIASIS.   ASSESSMENT AND PLAN:   Chronic cholecystitis with calculus Pt with bile leak after lap chole. Required biloma drain and ERCP.  Doing well.  Drain removed. Follow up in 3 weeks.   Has appt with Dr. Elnoria Howard in 1 week. Given work release for Monday.        Maudry Diego, MD Surgical Oncology, General & Endocrine Surgery [facility] Surgery, Gwenyth Bouillon, MD [place], Gwenlyn Found, MD

## 2012-05-08 NOTE — Patient Instructions (Signed)
Call for fever/ chills, jaundice.  Follow up in 3 weeks.

## 2012-05-08 NOTE — Assessment & Plan Note (Signed)
Pt with bile leak after lap chole. Required biloma drain and ERCP.  Doing well.  Drain removed. Follow up in 3 weeks.   Has appt with Dr. Elnoria Howard in 1 week. Given work release for Monday.

## 2012-05-09 NOTE — Discharge Summary (Signed)
Physician Discharge Summary  Patient ID: Meghan Park MRN: [id number] DOB/AGE: [birth year] 41 y.o.  Admit date: 04/22/2012 Discharge date: 05/09/2012  Admission Diagnoses: Bile leak following lap cholecystectomy Anxiety DM  Discharge Diagnoses:  Same  Discharged Condition: good  Hospital Course:  The patient was admitted several days following laparoscopic cholecystectomy with cholangiogram. She underwent CT scan which demonstrated a large fluid collection. This was aspirated and drained in interventional radiology and was bilious. Subsequently she underwent ERCP and stenting. Once this occurred, her diet was able to be advanced and her nausea and vomiting improved.  She is afebrile. She was discharged to home in improved condition. She will follow-up with me in 2 weeks.  Consults: GI  Significant Diagnostic Studies: labs: bilirubin normal  Treatments: IV hydration and perc drain, ERCP  Discharge Exam: Blood pressure 101/70, pulse 79, temperature 98.1 F (36.7 C), temperature source Oral, resp. rate 16, height 5\' 2"  (1.575 m), weight 204 lb (92.534 kg), SpO2 98.00%. General appearance: alert, cooperative and no distress Resp: no respiratory distress GI: soft, less distended, approp tender Extremities: extremities normal, atraumatic, no cyanosis or edema  Disposition: 01-Home or Self Care  Discharge Orders    Future Appointments: Provider: Department: Dept Phone: Center:   05/30/2012 4:45 PM Almond Lint, MD [facility] [phone] None       Medication List     As of 05/09/2012 11:49 PM    TAKE these medications         amoxicillin-clavulanate 875-125 MG per tablet   Commonly known as: AUGMENTIN   Take 1 tablet by mouth every 12 (twelve) hours.      diclofenac 75 MG EC tablet   Commonly known as: VOLTAREN   Take 75 mg by mouth daily.      glipiZIDE 10 MG 24 hr tablet   Commonly known as: GLUCOTROL XL   Take 1 tablet (10 mg total) by mouth daily.     glucose blood test strip   1 each by Other route as needed. Use as instructed      ibuprofen 200 MG tablet   Commonly known as: ADVIL,MOTRIN   Take 400 mg by mouth every 4 (four) hours as needed. For pain      metFORMIN 1000 MG tablet   Commonly known as: GLUCOPHAGE   Take 1,000 mg by mouth daily with breakfast.      sertraline 100 MG tablet   Commonly known as: ZOLOFT   Take 1 tablet (100 mg total) by mouth daily.           Follow-up Information    Follow up with [facility], [place]. Schedule an appointment as soon as possible for a visit in 2 weeks.   Contact information:   [address] [place] [address] [phone]          Signed: Almond Lint 05/09/2012, 11:49 PM

## 2012-05-14 ENCOUNTER — Emergency Department (HOSPITAL_COMMUNITY): Payer: Managed Care, Other (non HMO)

## 2012-05-14 ENCOUNTER — Telehealth (INDEPENDENT_AMBULATORY_CARE_PROVIDER_SITE_OTHER): Payer: Self-pay | Admitting: General Surgery

## 2012-05-14 ENCOUNTER — Encounter (HOSPITAL_COMMUNITY): Payer: Self-pay | Admitting: *Deleted

## 2012-05-14 ENCOUNTER — Observation Stay (HOSPITAL_COMMUNITY)
Admission: EM | Admit: 2012-05-14 | Discharge: 2012-05-14 | Disposition: A | Discharge status: Home / Self Care | Payer: Managed Care, Other (non HMO) | Attending: Emergency Medicine | Admitting: Emergency Medicine

## 2012-05-14 DIAGNOSIS — Z9089 Acquired absence of other organs: Secondary | ICD-10-CM | POA: Insufficient documentation

## 2012-05-14 DIAGNOSIS — E119 Type 2 diabetes mellitus without complications: Secondary | ICD-10-CM | POA: Insufficient documentation

## 2012-05-14 DIAGNOSIS — R109 Unspecified abdominal pain: Secondary | ICD-10-CM

## 2012-05-14 DIAGNOSIS — R112 Nausea with vomiting, unspecified: Principal | ICD-10-CM | POA: Insufficient documentation

## 2012-05-14 DIAGNOSIS — R1012 Left upper quadrant pain: Secondary | ICD-10-CM | POA: Insufficient documentation

## 2012-05-14 DIAGNOSIS — F17200 Nicotine dependence, unspecified, uncomplicated: Secondary | ICD-10-CM | POA: Insufficient documentation

## 2012-05-14 LAB — COMPREHENSIVE METABOLIC PANEL
ALT: 11 U/L (ref 0–35)
AST: 18 U/L (ref 0–37)
Albumin: 3.1 g/dL — ABNORMAL LOW (ref 3.5–5.2)
Alkaline Phosphatase: 86 U/L (ref 39–117)
BUN: 5 mg/dL — ABNORMAL LOW (ref 6–23)
CO2: 26 mEq/L (ref 19–32)
Calcium: 8.8 mg/dL (ref 8.4–10.5)
Chloride: 100 mEq/L (ref 96–112)
Creatinine, Ser: 0.46 mg/dL — ABNORMAL LOW (ref 0.50–1.10)
GFR calc Af Amer: >90 mL/min (ref >90)
GFR calc non Af Amer: >90 mL/min (ref >90)
Glucose, Bld: 146 mg/dL — ABNORMAL HIGH (ref 70–99)
Potassium: 3.1 mEq/L — ABNORMAL LOW (ref 3.5–5.1)
Sodium: 137 mEq/L (ref 135–145)
Total Bilirubin: 0.6 mg/dL (ref 0.3–1.2)
Total Protein: 7 g/dL (ref 6.0–8.3)

## 2012-05-14 LAB — CBC WITH DIFFERENTIAL/PLATELET
Basophils Absolute: 0 10*3/uL (ref 0.0–0.1)
Basophils Relative: 1 % (ref 0–1)
Eosinophils Absolute: 0.2 10*3/uL (ref 0.0–0.7)
Eosinophils Relative: 3 % (ref 0–5)
HCT: 33.4 % — ABNORMAL LOW (ref 36.0–46.0)
Hemoglobin: 11.4 g/dL — ABNORMAL LOW (ref 12.0–15.0)
Lymphocytes Relative: 32 % (ref 12–46)
Lymphs Abs: 2.3 10*3/uL (ref 0.7–4.0)
MCH: 28.9 pg (ref 26.0–34.0)
MCHC: 34.1 g/dL (ref 30.0–36.0)
MCV: 84.8 fL (ref 78.0–100.0)
Monocytes Absolute: 0.4 10*3/uL (ref 0.1–1.0)
Monocytes Relative: 6 % (ref 3–12)
Neutro Abs: 4.3 10*3/uL (ref 1.7–7.7)
Neutrophils Relative %: 59 % (ref 43–77)
Platelets: 233 10*3/uL (ref 150–400)
RBC: 3.94 MIL/uL (ref 3.87–5.11)
RDW: 14.5 % (ref 11.5–15.5)
WBC: 7.2 10*3/uL (ref 4.0–10.5)

## 2012-05-14 LAB — LIPASE, BLOOD: Lipase: 52 U/L (ref 11–59)

## 2012-05-14 MED ORDER — ONDANSETRON HCL 4 MG PO TABS: 4.0000 mg | ORAL_TABLET | Freq: Four times a day (QID) | ORAL | Status: DC

## 2012-05-14 MED ORDER — IOHEXOL 300 MG/ML  SOLN
100.0000 mL | Freq: Once | INTRAMUSCULAR | Status: AC | PRN
  Administered 2012-05-14: 100 mL via INTRAVENOUS

## 2012-05-14 MED ORDER — ONDANSETRON HCL 4 MG/2ML IJ SOLN
4.0000 mg | Freq: Once | INTRAMUSCULAR | Status: AC
  Administered 2012-05-14: 4 mg via INTRAVENOUS
  Filled 2012-05-14: qty 2

## 2012-05-14 MED ORDER — MORPHINE SULFATE 4 MG/ML IJ SOLN
6.0000 mg | Freq: Once | INTRAMUSCULAR | Status: AC
  Administered 2012-05-14: 6 mg via INTRAVENOUS
  Filled 2012-05-14: qty 2

## 2012-05-14 MED ORDER — SODIUM CHLORIDE 0.9 % IV BOLUS (SEPSIS)
1000.0000 mL | Freq: Once | INTRAVENOUS | Status: AC
  Administered 2012-05-14: 1000 mL via INTRAVENOUS

## 2012-05-14 NOTE — ED Notes (Signed)
Pt finished all the CT contrast she can handle.

## 2012-05-14 NOTE — ED Notes (Signed)
Pt returned from CT °

## 2012-05-14 NOTE — ED Notes (Addendum)
Pt comes in with c/o of  Hx of gallbladder surgery on 9/19 gallbladder surgery. Pt then developed an abscess on left side of abdomen. Drain placed, drained was removed 10/15. Since Saturday 10/19 pt has been vomiting. Pt has left sided abdominal pain that radiates to back 5/10, started on Thursday 10/17  Pt called surgeon on call, dr latent, and he told pt to come to ED.

## 2012-05-14 NOTE — Telephone Encounter (Signed)
She called c/o nausea and vomiting and difficulty keeping any food down since yesterday.  She has had some abdominal pain but no fevers.  I recommended that she come to [facility] ER for evaluation with labs and CT scan to evaluate for abscess or continued bile leak.

## 2012-05-14 NOTE — H&P (Addendum)
Meghan Park is an 41 y.o. female.  HPI: this patient is status post laparoscopic cholecystectomy by Dr. Donell Beers. Her hospital course was complicated with a postoperative bile leak and this was treated with biliary stent and percutaneous drainage. She has done well from this and was seen earlier in the clinic on Monday and her drain was removed. She said she did well all week and has been eating regular diet without difficulty until yesterday morning she began having nausea and vomiting. She says that she is having a hard time keeping even liquids down. Her only other complaint other than the nausea and vomiting and some left upper quadrant discomfort which radiates around to her back. She denies any fevers or chills any urinary symptoms and states that her bowels have been loose ever since her surgery but really has no change from that.  Past Medical History  Diagnosis Date  . DM (diabetes mellitus), type 2, uncontrolled   . Anxiety associated with depression   . Hip pain, right     Past Surgical History  Procedure Date  . Cesarean section     x2  . Abdominal hysterectomy 2012  . Cholecystectomy   . Ercp 04/24/2012    Procedure: ENDOSCOPIC RETROGRADE CHOLANGIOPANCREATOGRAPHY (ERCP);  Surgeon: Theda Belfast, MD;  Location: [facility];  Service: Endoscopy;  Laterality: N/A;    Family History  Problem Relation Age of Onset  . Anxiety disorder Mother   . Arthritis Father   . Heart disease Maternal Grandfather     Social History:  reports that she has been smoking Cigarettes.  She has a 10 pack-year smoking history. She does not have any smokeless tobacco history on file. She reports that she does not drink alcohol or use illicit drugs.  Allergies: No Known Allergies  Medications: I have reviewed the patient's current medications.  Results for orders placed during the hospital encounter of 05/14/12 (from the past 48 hour(s))  CBC WITH DIFFERENTIAL     Status: Abnormal   Collection Time   05/14/12 10:50 AM      Component Value Range Comment   WBC 7.2  4.0 - 10.5 K/uL    RBC 3.94  3.87 - 5.11 MIL/uL    Hemoglobin 11.4 (*) 12.0 - 15.0 g/dL    HCT 14.7 (*) 82.9 - 46.0 %    MCV 84.8  78.0 - 100.0 fL    MCH 28.9  26.0 - 34.0 pg    MCHC 34.1  30.0 - 36.0 g/dL    RDW 56.2  13.0 - 86.5 %    Platelets 233  150 - 400 K/uL    Neutrophils Relative 59  43 - 77 %    Neutro Abs 4.3  1.7 - 7.7 K/uL    Lymphocytes Relative 32  12 - 46 %    Lymphs Abs 2.3  0.7 - 4.0 K/uL    Monocytes Relative 6  3 - 12 %    Monocytes Absolute 0.4  0.1 - 1.0 K/uL    Eosinophils Relative 3  0 - 5 %    Eosinophils Absolute 0.2  0.0 - 0.7 K/uL    Basophils Relative 1  0 - 1 %    Basophils Absolute 0.0  0.0 - 0.1 K/uL   LIPASE, BLOOD     Status: Normal   Collection Time   05/14/12 10:50 AM      Component Value Range Comment   Lipase 52  11 - 59 U/L  COMPREHENSIVE METABOLIC PANEL     Status: Abnormal   Collection Time   05/14/12 10:50 AM      Component Value Range Comment   Sodium 137  135 - 145 mEq/L    Potassium 3.1 (*) 3.5 - 5.1 mEq/L    Chloride 100  96 - 112 mEq/L    CO2 26  19 - 32 mEq/L    Glucose, Bld 146 (*) 70 - 99 mg/dL    BUN 5 (*) 6 - 23 mg/dL    Creatinine, Ser 1.61 (*) 0.50 - 1.10 mg/dL    Calcium 8.8  8.4 - 09.6 mg/dL    Total Protein 7.0  6.0 - 8.3 g/dL    Albumin 3.1 (*) 3.5 - 5.2 g/dL    AST 18  0 - 37 U/L    ALT 11  0 - 35 U/L    Alkaline Phosphatase 86  39 - 117 U/L    Total Bilirubin 0.6  0.3 - 1.2 mg/dL    GFR calc non Af Amer >90  >90 mL/min    GFR calc Af Amer >90  >90 mL/min     No results found.  All other review of systems negative or noncontributory except as stated in the HPI  Blood pressure 137/95, pulse 114, temperature 97.5 F (36.4 C), temperature source Oral, resp. rate 19, SpO2 100.00%. General appearance: alert, cooperative and no distress Head: Normocephalic, without obvious abnormality, atraumatic Neck: no JVD and supple,  symmetrical, trachea midline Resp: nonlabored Cardio: mild tachycardia, regular GI: soft, very mild LUQ tenderness, ND, incisions without infection, no peritoneal signs Extremities: extremities normal, atraumatic, no cyanosis or edema Pulses: 2+ and symmetric Skin: Skin color, texture, turgor normal. No rashes or lesions Neurologic: Grossly normal  Assessment/Plan: Status post laparoscopic cholecystectomy with nausea vomiting and abdominal pain I spoke with her earlier on the phone and recommended that she come in for evaluation he is concerned that she may have recurrent fluid collection either from the bile leak or possible abscess. This could be enteritis as well. She has a fairly benign exam and her labs have been essentially normal today. Her CT scan is pending. I think that this will showed the most lateral of the situation. She may have a postoperative abscess or recurrent fluid collection, but her CT scan does not show an obvious cause, I think that she will most likely need overnight admission at least for fluid hydration since she is unable to keep anything down. We will plan for admission and fluid hydration and we will keep her n.p.o. For now.  Lodema Pilot DAVID 05/14/2012, 11:52 AM    I discussed the CT with the radiologist and it looks pretty normal.  Labs normal as well.  This is probably a gastroenteritis but since she is not even keeping fluids down, will admit her for hydration and observation.  Hopefully try foods again and discharge soon.   I discussed the Ct results with her and I do not see a cause for her nausea and vomiting.  This may be enteritis.  I recommended that she stay for hydration and to ensure that she can keep food down but she wants to go home and try at home.  I discussed this with the ER physician as well.  She will return if the nausea and vomiting persists.

## 2012-05-14 NOTE — ED Notes (Signed)
Patient transported to CT 

## 2012-05-14 NOTE — ED Provider Notes (Signed)
History     41 year old female with abdominal pain and emesis. Patient is status post laparoscopic cholecystectomy approximately one month ago. Her course was complicated by a bile leak. She was recently admitted and had ERCP, stent and percutaneous drain placed. Perc drain pulled about a week ago. Patient states this began developing worsening abdominal pain or left upper quadrant and nausea and vomiting about 3 days ago. Progressively worsening. Pain is worse with movement. Does not radiate. Feels mildly bloated. Having bowel movements including today. No fevers or chills. No urinary complaints. Previously on abx but finished about a week ago.  CSN: [id number]  Arrival date & time 05/14/12  1191   First MD Initiated Contact with Patient 05/14/12 1009      Chief Complaint  Patient presents with  . Emesis    (Consider location/radiation/quality/duration/timing/severity/associated sxs/prior treatment) HPI  Past Medical History  Diagnosis Date  . DM (diabetes mellitus), type 2, uncontrolled   . Anxiety associated with depression   . Hip pain, right     Past Surgical History  Procedure Date  . Cesarean section     x2  . Abdominal hysterectomy 2012  . Cholecystectomy   . Ercp 04/24/2012    Procedure: ENDOSCOPIC RETROGRADE CHOLANGIOPANCREATOGRAPHY (ERCP);  Surgeon: Theda Belfast, MD;  Location: [facility];  Service: Endoscopy;  Laterality: N/A;    Family History  Problem Relation Age of Onset  . Anxiety disorder Mother   . Arthritis Father   . Heart disease Maternal Grandfather     History  Substance Use Topics  . Smoking status: Current Every Day Smoker -- 0.5 packs/day for 20 years    Types: Cigarettes  . Smokeless tobacco: Not on file  . Alcohol Use: No    OB History    Grav Para Term Preterm Abortions TAB SAB Ect Mult Living                  Review of Systems   Review of symptoms negative unless otherwise noted in HPI.   Allergies  Review of patient's  allergies indicates no known allergies.  Home Medications   Current Outpatient Rx  Name Route Sig Dispense Refill  . CLONAZEPAM 1 MG PO TABS  Take 1/2 to 1 tablet every day as needed for anxiety 30 tablet 0  . DICLOFENAC SODIUM 75 MG PO TBEC Oral Take 75 mg by mouth daily.    [name] Kitchen GLIPIZIDE ER 10 MG PO TB24 Oral Take 1 tablet (10 mg total) by mouth daily. 30 tablet 1  . IBUPROFEN 200 MG PO TABS Oral Take 400 mg by mouth every 4 (four) hours as needed. For pain    . METFORMIN HCL 1000 MG PO TABS Oral Take 1,000 mg by mouth daily with breakfast.    . PROMETHAZINE HCL 12.5 MG PO TABS Oral Take 12.5 mg by mouth every 6 (six) hours as needed. Nausea    . SERTRALINE HCL 100 MG PO TABS Oral Take 1 tablet (100 mg total) by mouth daily. 30 tablet 11  . AMOXICILLIN-POT CLAVULANATE 875-125 MG PO TABS Oral Take 1 tablet by mouth every 12 (twelve) hours. 20 tablet 0  . GLUCOSE BLOOD VI STRP Other 1 each by Other route as needed. Use as instructed      BP 137/95  Pulse 114  Temp 97.5 F (36.4 C) (Oral)  Resp 19  SpO2 100%  Physical Exam  Nursing note and vitals reviewed. Constitutional: She appears well-developed and well-nourished. No distress.  Laying in bed. NAd.  HENT:  Head: Normocephalic and atraumatic.  Eyes: Conjunctivae normal are normal. Right eye exhibits no discharge. Left eye exhibits no discharge.  Neck: Neck supple.  Cardiovascular: Normal rate, regular rhythm and normal heart sounds.  Exam reveals no gallop and no friction rub.   No murmur heard. Pulmonary/Chest: Effort normal and breath sounds normal. No respiratory distress.  Abdominal: Soft. She exhibits no distension. There is no tenderness.       Healing laparoscopic incisions. No distention. Mild tenderness in left upper quadrant without rebound or guarding. Bandage at former percutaneous drain site. Clean and dry.  Musculoskeletal: She exhibits no edema and no tenderness.  Neurological: She is alert.  Skin: Skin is  warm and dry.  Psychiatric: She has a normal mood and affect. Her behavior is normal. Thought content normal.    ED Course  Procedures (including critical care time)   Labs Reviewed  CBC WITH DIFFERENTIAL  LIPASE, BLOOD  COMPREHENSIVE METABOLIC PANEL   Ct Abdomen Pelvis W Contrast  05/14/2012  *RADIOLOGY REPORT*  Clinical Data: Emesis.  Nausea and vomiting  CT ABDOMEN AND PELVIS WITH CONTRAST  Technique:  Multidetector CT imaging of the abdomen and pelvis was performed following the standard protocol during bolus administration of intravenous contrast.  Contrast: OMNIPAQUE IOHEXOL 300 MG/ML  SOLN  Comparison: 04/22/2012  Findings: Mild dependent changes noted in the lung bases.  No pericardial or pleural effusion.  Diffuse fatty infiltration of the liver parenchyma identified.  The previously described large sub hepatic fluid collection has completely resolved. Residual fat stranding within the omental fat is identified, image 20.  Previous cholecystectomy.  There is a stent identified within the common bile duct with decompression of the intrahepatic bile ducts.  The pancreas has a normal appearance normal appearance of the spleen.  Both adrenal glands appear normal.  The right kidney is unremarkable.  The left kidney is normal.Urinary bladder is unremarkable.  Previous hysterectomy.  The stomach appears within normal limits.  The small bowel loops are within normal limits.  Normal appearance of the colon.  The appendix is visualized and appears normal.  Review of the visualized bony structures demonstrates no acute findings.  No aggressive lytic or sclerotic bone lesions.  IMPRESSION:  1. Interval resolution of previous large upper abdominal fluid collection 2.  No new findings. 3.  Status post common bile duct stenting.   Original Report Authenticated By: Rosealee Albee, M.D.      1. Nausea and vomiting   2. Abdominal pain       MDM  41 year old female with abdominal pain and  nausea and vomiting. CT scan of the abdomen pelvis does not show any acute abnormality which would explain her symptoms. Patient was evaluated by general surgery in the ED. Admission for observation was offered to the patient but she is declining. She will be discharged antiemetics. Return precautions were discussed. Outpatient followup with general surgery otherwise.        Raeford Razor, MD 05/14/12 1315

## 2012-05-14 NOTE — ED Notes (Signed)
Family at bedside. 

## 2012-05-26 ENCOUNTER — Ambulatory Visit (INDEPENDENT_AMBULATORY_CARE_PROVIDER_SITE_OTHER): Payer: Managed Care, Other (non HMO) | Admitting: Emergency Medicine

## 2012-05-26 ENCOUNTER — Ambulatory Visit: Payer: Managed Care, Other (non HMO)

## 2012-05-26 VITALS — BP 124/86 | HR 98 | Temp 98.3°F | Resp 18 | Ht 66.0 in | Wt 198.0 lb

## 2012-05-26 DIAGNOSIS — R110 Nausea: Secondary | ICD-10-CM

## 2012-05-26 DIAGNOSIS — R1110 Vomiting, unspecified: Secondary | ICD-10-CM

## 2012-05-26 DIAGNOSIS — R350 Frequency of micturition: Secondary | ICD-10-CM

## 2012-05-26 DIAGNOSIS — N390 Urinary tract infection, site not specified: Secondary | ICD-10-CM

## 2012-05-26 DIAGNOSIS — K9189 Other postprocedural complications and disorders of digestive system: Secondary | ICD-10-CM | POA: Insufficient documentation

## 2012-05-26 DIAGNOSIS — M549 Dorsalgia, unspecified: Secondary | ICD-10-CM

## 2012-05-26 DIAGNOSIS — K838 Other specified diseases of biliary tract: Secondary | ICD-10-CM

## 2012-05-26 LAB — POCT UA - MICROSCOPIC ONLY: Crystals, Ur, HPF, POC: NEGATIVE

## 2012-05-26 LAB — AMYLASE: Amylase: 26 U/L (ref 0–105)

## 2012-05-26 LAB — POCT URINALYSIS DIPSTICK
Bilirubin, UA: NEGATIVE
Ketones, UA: NEGATIVE
Spec Grav, UA: 1.015
pH, UA: 6

## 2012-05-26 LAB — POCT CBC
MCH, POC: 27.6 pg (ref 27–31.2)
MCV: 89.5 fL (ref 80–97)
MID (cbc): 0.5 (ref 0–0.9)
MPV: 8.1 fL (ref 0–99.8)
POC LYMPH PERCENT: 25.2 %L (ref 10–50)
POC MID %: 5.9 %M (ref 0–12)
Platelet Count, POC: 255 10*3/uL (ref 142–424)
RDW, POC: 15.4 %
WBC: 8 10*3/uL (ref 4.6–10.2)

## 2012-05-26 LAB — COMPREHENSIVE METABOLIC PANEL
Alkaline Phosphatase: 82 U/L (ref 39–117)
BUN: 4 mg/dL — ABNORMAL LOW (ref 6–23)
Creat: 0.55 mg/dL (ref 0.50–1.10)
Glucose, Bld: 197 mg/dL — ABNORMAL HIGH (ref 70–99)
Total Bilirubin: 0.8 mg/dL (ref 0.3–1.2)

## 2012-05-26 MED ORDER — ONDANSETRON 8 MG PO TBDP: 8.0000 mg | ORAL_TABLET | Freq: Three times a day (TID) | ORAL | Status: DC | PRN

## 2012-05-26 MED ORDER — CIPROFLOXACIN HCL 500 MG PO TABS: 500.0000 mg | ORAL_TABLET | Freq: Two times a day (BID) | ORAL | Status: DC

## 2012-05-26 NOTE — Patient Instructions (Addendum)
Urinary Tract Infection Urinary tract infections (UTIs) can develop anywhere along your urinary tract. Your urinary tract is your body's drainage system for removing wastes and extra water. Your urinary tract includes two kidneys, two ureters, a bladder, and a urethra. Your kidneys are a pair of bean-shaped organs. Each kidney is about the size of your fist. They are located below your ribs, one on each side of your spine. CAUSES Infections are caused by microbes, which are microscopic organisms, including fungi, viruses, and bacteria. These organisms are so small that they can only be seen through a microscope. Bacteria are the microbes that most commonly cause UTIs. SYMPTOMS  Symptoms of UTIs may vary by age and gender of the patient and by the location of the infection. Symptoms in young women typically include a frequent and intense urge to urinate and a painful, burning feeling in the bladder or urethra during urination. Older women and men are more likely to be tired, shaky, and weak and have muscle aches and abdominal pain. A fever may mean the infection is in your kidneys. Other symptoms of a kidney infection include pain in your back or sides below the ribs, nausea, and vomiting. DIAGNOSIS To diagnose a UTI, your caregiver will ask you about your symptoms. Your caregiver also will ask to provide a urine sample. The urine sample will be tested for bacteria and white blood cells. White blood cells are made by your body to help fight infection. TREATMENT  Typically, UTIs can be treated with medication. Because most UTIs are caused by a bacterial infection, they usually can be treated with the use of antibiotics. The choice of antibiotic and length of treatment depend on your symptoms and the type of bacteria causing your infection. HOME CARE INSTRUCTIONS  If you were prescribed antibiotics, take them exactly as your caregiver instructs you. Finish the medication even if you feel better after you  have only taken some of the medication.  Drink enough water and fluids to keep your urine clear or pale yellow.  Avoid caffeine, tea, and carbonated beverages. They tend to irritate your bladder.  Empty your bladder often. Avoid holding urine for long periods of time.  Empty your bladder before and after sexual intercourse.  After a bowel movement, women should cleanse from front to back. Use each tissue only once. SEEK MEDICAL CARE IF:   You have back pain.  You develop a fever.  Your symptoms do not begin to resolve within 3 days. SEEK IMMEDIATE MEDICAL CARE IF:   You have severe back pain or lower abdominal pain.  You develop chills.  You have nausea or vomiting.  You have continued burning or discomfort with urination. MAKE SURE YOU:   Understand these instructions.  Will watch your condition.  Will get help right away if you are not doing well or get worse. Document Released: 04/21/2005 Document Revised: 01/11/2012 Document Reviewed: 08/20/2011 [employer] Patient Information 2013 [employer].  

## 2012-05-26 NOTE — Progress Notes (Signed)
  Subjective:    Patient ID: Meghan Park, female    DOB: 03/20/1971, 41 y.o.   MRN: [id number]  HPI    Review of Systems     Objective:   Physical Exam   [facility] reading (PRIMARY) by  Dr.Zebulan Hinshaw is stent present in the right upper abdomen there are no acute findings seen.       Assessment & Plan:   

## 2012-05-26 NOTE — Progress Notes (Signed)
Subjective:    Patient ID: Meghan Park, female    DOB: 11-Dec-1970, 41 y.o.   MRN: [id number]  HPI Patient presents with chief complaint of lower back pain, "sharp" pain, onset 2 days ago beginning on left side and radiating to right side yesterday.  urinary frequency-"more than normal" x 5 days.  No burning with urination.  Sensation that she is not emptying bladder, "pressure".  Saw brown on toilet tissue when wiped yesterday Nausea and vomiting 5 times in last 24 hours. no fever  Patient feeling a little depressed after complication relating to GB surgery. Patient on Zoloft for depression.  Patient speaks with pastor regarding situation.  She expresses that she thought the GB surgery would make her feel better.  Tired of feeling sick.    Review of Systems GB removed sept 2013 by Dr Donell Beers at [facility], complication: bile leakage, abdominal fluid collection, drainage tube in for 2 weeks, stent placed by Dr Elnoria Howard.  Follow up appointment on Monday 05/29/12.   Diabetic with sugars around 290.  Patient attributes this to surgery and illness. Partial hysterectomy   Objective:   Physical Exam  Results for orders placed in visit on 05/26/12  POCT UA - MICROSCOPIC ONLY      Component Value Range   WBC, Ur, HPF, POC TNTC     RBC, urine, microscopic 8-12     Bacteria, U Microscopic 1+     Mucus, UA small     Epithelial cells, urine per micros 4-8     Crystals, Ur, HPF, POC neg     Casts, Ur, LPF, POC neg     Yeast, UA neg    POCT URINALYSIS DIPSTICK      Component Value Range   Color, UA yellow     Clarity, UA cloudy     Glucose, UA neg     Bilirubin, UA neg     Ketones, UA neg     Spec Grav, UA 1.015     Blood, UA mod     pH, UA 6.0     Protein, UA 100     Urobilinogen, UA 0.2     Nitrite, UA Pos     Leukocytes, UA moderate (2+)     Results for orders placed in visit on 05/26/12  POCT UA - MICROSCOPIC ONLY      Component Value Range   WBC, Ur, HPF, POC TNTC     RBC, urine,  microscopic 8-12     Bacteria, U Microscopic 1+     Mucus, UA small     Epithelial cells, urine per micros 4-8     Crystals, Ur, HPF, POC neg     Casts, Ur, LPF, POC neg     Yeast, UA neg    POCT URINALYSIS DIPSTICK      Component Value Range   Color, UA yellow     Clarity, UA cloudy     Glucose, UA neg     Bilirubin, UA neg     Ketones, UA neg     Spec Grav, UA 1.015     Blood, UA mod     pH, UA 6.0     Protein, UA 100     Urobilinogen, UA 0.2     Nitrite, UA Pos     Leukocytes, UA moderate (2+)    POCT CBC      Component Value Range   WBC 8.0  4.6 - 10.2 K/uL   Lymph, poc 2.0  0.6 - 3.4   POC LYMPH PERCENT 25.2  10 - 50 %L   MID (cbc) 0.5  0 - 0.9   POC MID % 5.9  0 - 12 %M   POC Granulocyte 5.5  2 - 6.9   Granulocyte percent 68.9  37 - 80 %G   RBC 4.10  4.04 - 5.48 M/uL   Hemoglobin 11.3 (*) 12.2 - 16.2 g/dL   HCT, POC 40.9 (*) 81.1 - 47.9 %   MCV 89.5  80 - 97 fL   MCH, POC 27.6  27 - 31.2 pg   MCHC 30.8 (*) 31.8 - 35.4 g/dL   RDW, POC 91.4     Platelet Count, POC 255  142 - 424 K/uL   MPV 8.1  0 - 99.8 fL        Assessment & Plan:  Patient had a normal CT abdomen and pelvis 10 days ago. This showed resolution of her previous biliary fluid collection. She has a normal white count today with an abnormal urinalysis consistent with urinary tract infection. We'll treat with Cipro 500 twice a day for 7 days and do a urine culture. She will followup with Dr. Elnoria Howard on Monday

## 2012-05-29 ENCOUNTER — Other Ambulatory Visit: Payer: Self-pay | Admitting: Gastroenterology

## 2012-05-30 ENCOUNTER — Encounter (INDEPENDENT_AMBULATORY_CARE_PROVIDER_SITE_OTHER): Payer: Managed Care, Other (non HMO) | Admitting: General Surgery

## 2012-05-31 ENCOUNTER — Telehealth: Payer: Self-pay

## 2012-05-31 MED ORDER — CLONAZEPAM 1 MG PO TABS: ORAL_TABLET | ORAL | Status: DC

## 2012-05-31 MED ORDER — METFORMIN HCL 1000 MG PO TABS: 1000.0000 mg | ORAL_TABLET | Freq: Every day | ORAL | Status: DC

## 2012-05-31 MED ORDER — GLIPIZIDE ER 10 MG PO TB24: 10.0000 mg | ORAL_TABLET | Freq: Every day | ORAL | Status: DC

## 2012-05-31 NOTE — Telephone Encounter (Signed)
The clonazepam is set to phone, so please call in as it did not print an Rx.

## 2012-05-31 NOTE — Telephone Encounter (Signed)
Patient states that 4 regular meds were not sent over after last OV with Dr. Cleta Alberts. Klonopin, Glipizide, Metformin, and Centroline. Please call into [facility] on [address]. #[phone]

## 2012-05-31 NOTE — Telephone Encounter (Signed)
1) Sertraline: given 11 refills on 12/11/11 2) Glipizide: Will send in refills 3) Metformin: Will send in refills 4) Clonazepam: Will send in refill

## 2012-05-31 NOTE — Telephone Encounter (Signed)
Called in for her, called patient to advise. 

## 2012-06-01 ENCOUNTER — Telehealth: Payer: Self-pay | Admitting: Emergency Medicine

## 2012-06-01 ENCOUNTER — Other Ambulatory Visit: Payer: Self-pay | Admitting: Emergency Medicine

## 2012-06-01 NOTE — Telephone Encounter (Signed)
Please call the lab and find out the status of the urine culture done on this patient

## 2012-06-01 NOTE — Telephone Encounter (Signed)
Called [facility] they will be putting in results of urine culture in about 10 min

## 2012-06-02 ENCOUNTER — Ambulatory Visit (HOSPITAL_COMMUNITY)
Admission: RE | Admit: 2012-06-02 | Discharge: 2012-06-02 | Disposition: A | Discharge status: Home / Self Care | Payer: Managed Care, Other (non HMO) | Source: Ambulatory Visit | Attending: Gastroenterology | Admitting: Gastroenterology

## 2012-06-02 ENCOUNTER — Encounter (HOSPITAL_COMMUNITY): Payer: Self-pay | Admitting: *Deleted

## 2012-06-02 ENCOUNTER — Ambulatory Visit (HOSPITAL_COMMUNITY): Payer: Managed Care, Other (non HMO)

## 2012-06-02 ENCOUNTER — Encounter (HOSPITAL_COMMUNITY)
Admission: RE | Disposition: A | Discharge status: Home / Self Care | Payer: Self-pay | Source: Ambulatory Visit | Attending: Gastroenterology

## 2012-06-02 DIAGNOSIS — Z4689 Encounter for fitting and adjustment of other specified devices: Secondary | ICD-10-CM | POA: Insufficient documentation

## 2012-06-02 HISTORY — DX: Insomnia, unspecified: G47.00

## 2012-06-02 HISTORY — PX: ERCP: SHX5425

## 2012-06-02 HISTORY — DX: Fatty (change of) liver, not elsewhere classified: K76.0

## 2012-06-02 HISTORY — DX: Calculus of gallbladder without cholecystitis without obstruction: K80.20

## 2012-06-02 HISTORY — DX: Cystitis, unspecified without hematuria: N30.90

## 2012-06-02 HISTORY — DX: Pneumonia, unspecified organism: J18.9

## 2012-06-02 LAB — URINE CULTURE: Colony Count: >=100000

## 2012-06-02 SURGERY — ERCP, WITH INTERVENTION IF INDICATED
Anesthesia: Moderate Sedation

## 2012-06-02 MED ORDER — SODIUM CHLORIDE 0.9 % IV SOLN
INTRAVENOUS | Status: DC | PRN
  Administered 2012-06-02: 11:00:00

## 2012-06-02 MED ORDER — FENTANYL CITRATE 0.05 MG/ML IJ SOLN
INTRAMUSCULAR | Status: AC
  Filled 2012-06-02: qty 4

## 2012-06-02 MED ORDER — FENTANYL CITRATE 0.05 MG/ML IJ SOLN
INTRAMUSCULAR | Status: DC | PRN
  Administered 2012-06-02 (×3): 25 ug via INTRAVENOUS

## 2012-06-02 MED ORDER — SODIUM CHLORIDE 0.9 % IV SOLN: INTRAVENOUS | Status: DC

## 2012-06-02 MED ORDER — DIPHENHYDRAMINE HCL 50 MG/ML IJ SOLN
INTRAMUSCULAR | Status: AC
  Filled 2012-06-02: qty 1

## 2012-06-02 MED ORDER — BUTAMBEN-TETRACAINE-BENZOCAINE 2-2-14 % EX AERO
INHALATION_SPRAY | CUTANEOUS | Status: DC | PRN
  Administered 2012-06-02: 2 via TOPICAL

## 2012-06-02 MED ORDER — MIDAZOLAM HCL 10 MG/2ML IJ SOLN
INTRAMUSCULAR | Status: AC
  Filled 2012-06-02: qty 4

## 2012-06-02 MED ORDER — DIPHENHYDRAMINE HCL 50 MG/ML IJ SOLN
INTRAMUSCULAR | Status: DC | PRN
  Administered 2012-06-02: 25 mg via INTRAVENOUS

## 2012-06-02 MED ORDER — MIDAZOLAM HCL 10 MG/2ML IJ SOLN
INTRAMUSCULAR | Status: DC | PRN
  Administered 2012-06-02: 1 mg via INTRAVENOUS
  Administered 2012-06-02 (×2): 2 mg via INTRAVENOUS
  Administered 2012-06-02: 1 mg via INTRAVENOUS

## 2012-06-02 MED ORDER — GLUCAGON HCL (RDNA) 1 MG IJ SOLR
INTRAMUSCULAR | Status: AC
  Filled 2012-06-02: qty 2

## 2012-06-02 NOTE — Op Note (Signed)
[facility] [address] [place], [address]   ERCP PROCEDURE REPORT  PATIENT: Meghan Park, Meghan L.  MR# :[id number] BIRTHDATE: [birth year]  GENDER: Female ENDOSCOPIST: Jeani Hawking, MD REFERRED BY: Almond Lint, M.D. PROCEDURE DATE:  06/02/2012 PROCEDURE:   ERCP with removal of biliary stenti ASA CLASS:   Class III INDICATIONS:Stent removal. MEDICATIONS: Versed 6 mg IV, Fentanyl 75 mcg IV, and Benadryl 25 mg IV TOPICAL ANESTHETIC: none  DESCRIPTION OF PROCEDURE:   After the risks benefits and alternatives of the procedure were thoroughly explained, informed consent was obtained.  The Pentax ERCP [id number]  endoscope was introduced through the mouth  and advanced to the second portion of the duodenum.  The prior biliary stent was removed witn a snare. Easy cannulation of the CBD was performed and the Al Pimple was secured in the left intrahepatic ducts.  Contrast injection was negative for any bile duct leaks and no evidence of any stones. The scope was then completely withdrawn from the patient and the procedure terminated.     COMPLICATIONS: .  There were no complications.  ENDOSCOPIC IMPRESSION: 1) Successful stent removal. 2) No evidence of bile duct leak or retained stones..  RECOMMENDATIONS: 1) Follow up PRN.     _______________________________ eSignedJeani Hawking, MD 06/02/2012 10:47 AM   JO:ACZYS Donell Beers, MD

## 2012-06-02 NOTE — Telephone Encounter (Signed)
Where are the sensitivities on the urine culture ???

## 2012-06-02 NOTE — H&P (View-Only) (Signed)
  Subjective:    Patient ID: Meghan Park, female    DOB: [birth year], 41 y.o.   MRN: [id number]  HPI    Review of Systems     Objective:   Physical Exam   [facility] reading (PRIMARY) by  Dr.Tenae Graziosi is stent present in the right upper abdomen there are no acute findings seen.       Assessment & Plan:

## 2012-06-02 NOTE — Interval H&P Note (Signed)
History and Physical Interval Note:  06/02/2012 9:47 AM  Meghan Park  has presented today for surgery, with the diagnosis of stent removal  The various methods of treatment have been discussed with the patient and family. After consideration of risks, benefits and other options for treatment, the patient has consented to  Procedure(s) (LRB) with comments: ENDOSCOPIC RETROGRADE CHOLANGIOPANCREATOGRAPHY (ERCP) (N/A) as a surgical intervention .  The patient's history has been reviewed, patient examined, no change in status, stable for surgery.  I have reviewed the patient's chart and labs.  Questions were answered to the patient's satisfaction.     Lathaniel Legate D

## 2012-06-02 NOTE — Interval H&P Note (Signed)
History and Physical Interval Note:  06/02/2012 8:53 AM  Meghan Park  has presented today for surgery, with the diagnosis of stent removal  The various methods of treatment have been discussed with the patient and family. After consideration of risks, benefits and other options for treatment, the patient has consented to  Procedure(s) (LRB) with comments: ENDOSCOPIC RETROGRADE CHOLANGIOPANCREATOGRAPHY (ERCP) (N/A) as a surgical intervention .  The patient's history has been reviewed, patient examined, no change in status, stable for surgery.  I have reviewed the patient's chart and labs.  Questions were answered to the patient's satisfaction.     Jeremy Mclamb D

## 2012-06-05 ENCOUNTER — Other Ambulatory Visit: Payer: Self-pay | Admitting: Radiology

## 2012-06-05 ENCOUNTER — Encounter (HOSPITAL_COMMUNITY): Payer: Self-pay | Admitting: Gastroenterology

## 2012-06-05 ENCOUNTER — Other Ambulatory Visit: Payer: Self-pay | Admitting: Physician Assistant

## 2012-06-05 MED ORDER — FLUCONAZOLE 150 MG PO TABS: 150.0000 mg | ORAL_TABLET | Freq: Once | ORAL | Status: DC

## 2012-06-05 MED ORDER — CEPHALEXIN 500 MG PO TABS: 500.0000 mg | ORAL_TABLET | Freq: Three times a day (TID) | ORAL | Status: DC

## 2012-06-08 ENCOUNTER — Encounter (INDEPENDENT_AMBULATORY_CARE_PROVIDER_SITE_OTHER): Payer: Self-pay | Admitting: General Surgery

## 2012-06-20 ENCOUNTER — Ambulatory Visit (INDEPENDENT_AMBULATORY_CARE_PROVIDER_SITE_OTHER): Payer: Managed Care, Other (non HMO) | Admitting: Family Medicine

## 2012-06-20 ENCOUNTER — Encounter: Payer: Self-pay | Admitting: Family Medicine

## 2012-06-20 VITALS — BP 153/94 | HR 103 | Temp 98.4°F | Resp 16 | Ht 64.5 in | Wt 203.0 lb

## 2012-06-20 DIAGNOSIS — R3989 Other symptoms and signs involving the genitourinary system: Secondary | ICD-10-CM

## 2012-06-20 DIAGNOSIS — N760 Acute vaginitis: Secondary | ICD-10-CM

## 2012-06-20 DIAGNOSIS — B9689 Other specified bacterial agents as the cause of diseases classified elsewhere: Secondary | ICD-10-CM | POA: Insufficient documentation

## 2012-06-20 DIAGNOSIS — R300 Dysuria: Secondary | ICD-10-CM | POA: Insufficient documentation

## 2012-06-20 LAB — POCT URINALYSIS DIPSTICK
Blood, UA: NEGATIVE
Nitrite, UA: NEGATIVE
Protein, UA: NEGATIVE
Spec Grav, UA: 1.01
Urobilinogen, UA: 0.2

## 2012-06-20 LAB — POCT UA - MICROSCOPIC ONLY
Casts, Ur, LPF, POC: NEGATIVE
Crystals, Ur, HPF, POC: NEGATIVE
Yeast, UA: NEGATIVE

## 2012-06-20 LAB — POCT WET PREP WITH KOH
KOH Prep POC: NEGATIVE
RBC Wet Prep HPF POC: NEGATIVE
Yeast Wet Prep HPF POC: NEGATIVE

## 2012-06-20 MED ORDER — METRONIDAZOLE 500 MG PO TABS: 500.0000 mg | ORAL_TABLET | Freq: Two times a day (BID) | ORAL | Status: DC

## 2012-06-20 NOTE — Progress Notes (Signed)
Patient discussed with Dr. Corey. Agree with assessment and plan of care per his note.   

## 2012-06-20 NOTE — Patient Instructions (Addendum)
Thank you for coming in today. 1) I think you have bacterial vaginosis. Take the Flagyl twice a day for 7 days. Come back if you're not feeling better. 2) if diclofenac is not working please switched to 2 Aleve twice a day and followup with your primary care doctor

## 2012-06-20 NOTE — Progress Notes (Signed)
Meghan Park is a 41 y.o. female who presents to [facility] today for lower abdominal pressure and pain present for about 2-3 days. Consistent with prior urinary tract infections. Patient's recent medical history significant for urinary tract infection treated initially with ciprofloxacin empirically.  However the Escherichia coli was resistant to Cipro and patient was switched to Keflex which he has finished about a week ago.  She felt well when she started taking the Keflex.  Additionally she had a recent ERCP which showed resolution of bile leak.  Her current symptoms are lower abdominal pressure similar to menstrual cramping and some dysuria.  She denies any fevers or chills significant abdominal pain nausea vomiting or diarrhea.  She is eating and drinking well and feels well otherwise.   PMH: Reviewed significant for anxiety, diabetes and depression. Past surgical history significant for cholecystectomy complicated by bile leak, C-section, hysterectomy, tubal ligation. History  Substance Use Topics  . Smoking status: Current Every Day Smoker -- 0.5 packs/day for 20 years    Types: Cigarettes  . Smokeless tobacco: Not on file  . Alcohol Use: No   ROS as above  Medications reviewed. Current Outpatient Prescriptions  Medication Sig Dispense Refill  . clonazePAM (KLONOPIN) 1 MG tablet Take 1/2 to 1 tablet every day as needed for anxiety  30 tablet  0  . diclofenac (VOLTAREN) 75 MG EC tablet Take 75 mg by mouth daily.      . fluconazole (DIFLUCAN) 150 MG tablet Take 1 tablet (150 mg total) by mouth once. Repeat if needed  2 tablet  0  . glipiZIDE (GLUCOTROL XL) 10 MG 24 hr tablet Take 1 tablet (10 mg total) by mouth daily.  30 tablet  1  . glucose blood test strip 1 each by Other route as needed. Use as instructed      . ibuprofen (ADVIL,MOTRIN) 200 MG tablet Take 400 mg by mouth every 4 (four) hours as needed. For pain      . metFORMIN (GLUCOPHAGE) 1000 MG tablet Take 1 tablet (1,000 mg total) by  mouth daily with breakfast.  30 tablet  1  . ondansetron (ZOFRAN) 4 MG tablet Take 1 tablet (4 mg total) by mouth every 6 (six) hours.  12 tablet  0  . ondansetron (ZOFRAN-ODT) 8 MG disintegrating tablet Take 1 tablet (8 mg total) by mouth every 8 (eight) hours as needed for nausea.  20 tablet  0  . promethazine (PHENERGAN) 12.5 MG tablet Take 12.5 mg by mouth every 6 (six) hours as needed. Nausea      . sertraline (ZOLOFT) 100 MG tablet Take 1 tablet (100 mg total) by mouth daily.  30 tablet  11    Exam:  BP 153/94  Pulse 103  Temp 98.4 F (36.9 C) (Oral)  Resp 16  Ht 5' 4.5" (1.638 m)  Wt 203 lb (92.08 kg)  BMI 34.31 kg/m2 Gen: Well NAD HEENT: EOMI,  MMM Lungs: CTABL Nl WOB Heart: RRR no MRG Abd: NABS, nondistended. Mildly tender to the bilateral lower quadrants without rebound or guarding Exts: Non edematous BL  LE, warm and well perfused.  GYN: Normal appearing external genitalia.  Vaginal canal with white discharge.  No cervix identified. Bimanual exam reveals no tenderness.  No adnexal masses.    Results for orders placed in visit on 06/20/12 (from the past 72 hour(s))  POCT URINALYSIS DIPSTICK     Status: Normal   Collection Time   06/20/12  5:57 PM  Component Value Range Comment   Color, UA yellow      Clarity, UA slightly cloudy      Glucose, UA neg      Bilirubin, UA neg      Ketones, UA neg      Spec Grav, UA 1.010      Blood, UA neg      pH, UA 6.0      Protein, UA neg      Urobilinogen, UA 0.2      Nitrite, UA neg      Leukocytes, UA Trace     POCT UA - MICROSCOPIC ONLY     Status: Normal   Collection Time   06/20/12  5:57 PM      Component Value Range Comment   WBC, Ur, HPF, POC 2-5      RBC, urine, microscopic 0-2      Bacteria, U Microscopic 1+      Mucus, UA neg      Epithelial cells, urine per micros 2-6      Crystals, Ur, HPF, POC neg      Casts, Ur, LPF, POC neg      Yeast, UA neg     POCT WET PREP WITH KOH     Status: Normal   Collection  Time   06/20/12  6:22 PM      Component Value Range Comment   Trichomonas, UA Negative      Clue Cells Wet Prep HPF POC 100%      Epithelial Wet Prep HPF POC 0-5      Yeast Wet Prep HPF POC neg      Bacteria Wet Prep HPF POC 4++      RBC Wet Prep HPF POC neg      WBC Wet Prep HPF POC 5-10      KOH Prep POC Negative       Assessment and Plan: 41 y.o. female with bacterial vaginosis and test equivocal for urinary tract infection. Abdominal pain likely reflective of BV.  Plan: Treat BV with Flagyl 500 twice a day x7 days Culture urine to evaluate for UTI GC/chlamydia test  Followup with primary care doctor as needed Discussed warning signs or symptoms. Please see discharge instructions. Patient expresses understanding.

## 2012-06-21 ENCOUNTER — Telehealth: Payer: Self-pay

## 2012-06-21 DIAGNOSIS — R110 Nausea: Secondary | ICD-10-CM

## 2012-06-21 MED ORDER — ONDANSETRON 8 MG PO TBDP: 8.0000 mg | ORAL_TABLET | Freq: Three times a day (TID) | ORAL | Status: DC | PRN

## 2012-06-21 NOTE — Telephone Encounter (Signed)
Called pt, is she taking with food? She states she is taking with food and would like something for the Nausea please advise. She uses [facility] [address]

## 2012-06-21 NOTE — Telephone Encounter (Signed)
I can either write for Zofran or can send in rx for Metrogel to treat BV. Which does she prefer?

## 2012-06-21 NOTE — Telephone Encounter (Signed)
PT WAS SEEN LAST NIGHT FOR ILLNESS.  SHE THINKS THE MEDICATION SHE WAS GIVEN IS MAKING HER SICK TO HER STOMACH.  COULD WE CALL HER IN SOMETHING FOR NAUSEA ASAP?  [phone]

## 2012-06-22 LAB — URINE CULTURE: Colony Count: 10000

## 2012-06-27 ENCOUNTER — Telehealth: Payer: Self-pay | Admitting: Family Medicine

## 2012-06-27 NOTE — Telephone Encounter (Signed)
Called cell and left a generic message saying labs were essentially normal and to call me at the urgent care Tuesday night if she had any questions.

## 2012-06-28 ENCOUNTER — Encounter: Payer: Self-pay | Admitting: Obstetrics and Gynecology

## 2012-06-29 ENCOUNTER — Encounter: Payer: Self-pay | Admitting: Family Medicine

## 2012-06-29 ENCOUNTER — Ambulatory Visit (INDEPENDENT_AMBULATORY_CARE_PROVIDER_SITE_OTHER): Payer: Managed Care, Other (non HMO) | Admitting: Family Medicine

## 2012-06-29 VITALS — BP 154/98 | HR 108 | Temp 98.4°F | Resp 16 | Ht 62.0 in | Wt 203.0 lb

## 2012-06-29 DIAGNOSIS — N63 Unspecified lump in unspecified breast: Secondary | ICD-10-CM

## 2012-06-29 DIAGNOSIS — E119 Type 2 diabetes mellitus without complications: Secondary | ICD-10-CM

## 2012-06-29 DIAGNOSIS — R110 Nausea: Secondary | ICD-10-CM

## 2012-06-29 DIAGNOSIS — G4700 Insomnia, unspecified: Secondary | ICD-10-CM

## 2012-06-29 DIAGNOSIS — M25559 Pain in unspecified hip: Secondary | ICD-10-CM

## 2012-06-29 MED ORDER — CYCLOBENZAPRINE HCL 10 MG PO TABS: ORAL_TABLET | ORAL | Status: DC

## 2012-06-29 MED ORDER — DICLOFENAC SODIUM 75 MG PO TBEC: 75.0000 mg | DELAYED_RELEASE_TABLET | Freq: Two times a day (BID) | ORAL | Status: DC

## 2012-06-29 MED ORDER — ONDANSETRON 8 MG PO TBDP: 8.0000 mg | ORAL_TABLET | Freq: Three times a day (TID) | ORAL | Status: DC | PRN

## 2012-06-29 MED ORDER — CLONAZEPAM 1 MG PO TABS: ORAL_TABLET | ORAL | Status: DC

## 2012-06-29 NOTE — Progress Notes (Signed)
[facility] [address] [place] [address] [phone]  Date:  06/29/2012   Name:  Meghan Park   DOB:  10-13-1970   MRN:  [id number]  PCP:  Abbe Amsterdam, MD    Chief Complaint: Diabetes   History of Present Illness:  Meghan Park is a 41 y.o. very pleasant female patient who presents with the following:  She recently has to be hospitalized for a post- cholecystectomy bile leak- she was treated with a bilary stent and percutaneous drainage.  She has had her drain removed and is doing a lot better.  She still has some trouble with nausea, and vomits a few times a week.  She is using zofran as needed.    She had a recent UTI and BV- she is feeling better in these regards.    She needs a recheck of her A1c today.  She is still on glipizide and metformin.  She has not been checking her glucose much recently.    Meghan Park is also concerned about a problem in her left breast. She had noted a tender area and ?a bump in the lateral breast.  She has thought it might be a boil but nothing ever seemed to develop.  The area has remained the same for the last 2 weeks.   She has not yet had a mammogram.    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 She has noted "shooting pains" through her toes at night on occasion.  She is using voltaren again for her hip- it helps some but seems to be less effective than it had been in the past  She also uses klonopin at night for anxiety and sleep.  She needs a refill of this medication.    She has had a hysterectomy   Patient Active Problem List  Diagnosis  . Anxiety associated with depression  . DM (diabetes mellitus), type 2, uncontrolled  . Hip pain, right  . Chronic cholecystitis with calculus  . Bile leak, postoperative  . Dysuria  . BV (bacterial vaginosis)    Past Medical History  Diagnosis Date  . DM (diabetes mellitus), type 2, uncontrolled   . Anxiety associated with  depression   . Hip pain, right   . Depression   . Anxiety   . Pneumonia hx  . Cystitis   . Cholelithiasis   . Fatty liver   . Arthritis hip, right  . Insomnia     Past Surgical History  Procedure Date  . Cesarean section     x2  . Abdominal hysterectomy 2012  . Cholecystectomy   . Ercp 04/24/2012    Procedure: ENDOSCOPIC RETROGRADE CHOLANGIOPANCREATOGRAPHY (ERCP);  Surgeon: Theda Belfast, MD;  Location: [facility];  Service: Endoscopy;  Laterality: N/A;  . Gallbladder surgery 04/13/2012  . Tubal ligation   . Ercp 06/02/2012    Procedure: ENDOSCOPIC RETROGRADE CHOLANGIOPANCREATOGRAPHY (ERCP);  Surgeon: Theda Belfast, MD;  Location: [facility];  Service: Endoscopy;  Laterality: N/A;    History  Substance Use Topics  . Smoking status: Current Every Day Smoker -- 0.5 packs/day for 20 years    Types: Cigarettes  . Smokeless tobacco: Not on file  . Alcohol Use: No    Family History  Problem Relation Age of Onset  . Anxiety disorder Mother   . Arthritis Father   . COPD Father   . Heart disease Father   . Heart disease Maternal Grandfather   .  Congestive Heart Failure Maternal Grandmother   . Heart disease Paternal Grandfather     Allergies  Allergen Reactions  . Morphine And Related Dermatitis and Other (See Comments)    Severe itching leading to welts and open wounds.    Medication list has been reviewed and updated.  Current Outpatient Prescriptions on File Prior to Visit  Medication Sig Dispense Refill  . clonazePAM (KLONOPIN) 1 MG tablet Take 1/2 to 1 tablet every day as needed for anxiety  30 tablet  0  . diclofenac (VOLTAREN) 75 MG EC tablet Take 75 mg by mouth daily.      . fluconazole (DIFLUCAN) 150 MG tablet Take 1 tablet (150 mg total) by mouth once. Repeat if needed  2 tablet  0  . glipiZIDE (GLUCOTROL XL) 10 MG 24 hr tablet Take 1 tablet (10 mg total) by mouth daily.  30 tablet  1  . glucose blood test strip 1 each by Other route as needed. Use as  instructed      . metFORMIN (GLUCOPHAGE) 1000 MG tablet Take 1 tablet (1,000 mg total) by mouth daily with breakfast.  30 tablet  1  . ondansetron (ZOFRAN-ODT) 8 MG disintegrating tablet Take 1 tablet (8 mg total) by mouth every 8 (eight) hours as needed for nausea.  20 tablet  0  . sertraline (ZOLOFT) 100 MG tablet Take 1 tablet (100 mg total) by mouth daily.  30 tablet  11  . ibuprofen (ADVIL,MOTRIN) 200 MG tablet Take 400 mg by mouth every 4 (four) hours as needed. For pain      . metroNIDAZOLE (FLAGYL) 500 MG tablet Take 1 tablet (500 mg total) by mouth 2 (two) times daily.  14 tablet  0  . ondansetron (ZOFRAN) 4 MG tablet Take 1 tablet (4 mg total) by mouth every 6 (six) hours.  12 tablet  0  . promethazine (PHENERGAN) 12.5 MG tablet Take 12.5 mg by mouth every 6 (six) hours as needed. Nausea        Review of Systems:  As per HPI- otherwise negative.   Physical Examination: Filed Vitals:   06/29/12 0805  BP: 154/98  Pulse: 108  Temp: 98.4 F (36.9 C)  Resp: 16   Filed Vitals:   06/29/12 0805  Height: 5\' 2"  (1.575 m)  Weight: 203 lb (92.08 kg)   Body mass index is 37.13 kg/(m^2). Ideal Body Weight: Weight in (lb) to have BMI = 25: 136.4   GEN: WDWN, NAD, Non-toxic, A & O x 3, overweight HEENT: Atraumatic, Normocephalic. Neck supple. No masses, No LAD. Bilateral TM wnl, oropharynx normal.  PEERL,EOMI.   Ears and Nose: No external deformity. CV: RRR, No M/G/R. No JVD. No thrill. No extra heart sounds. PULM: CTA B, no wheezes, crackles, rhonchi. No retractions. No resp. distress. No accessory muscle use. ABD: S,  ND, +BS. No rebound. No HSM. Minimal generalized tenderness but no sign of an acute abdomen.  She reports that her tenderness is getting better Right hip: she is tender on the lateral hip area, above the greater trochanter.  Normal log role, normal ROM of the hip Breast exam: no masses.  There is a small tender area on the left breast, at 3:00.  No dimpling or  discharge EXTR: No c/c/e NEURO Normal gait.  PSYCH: Normally interactive. Conversant. Not depressed or anxious appearing.  Calm demeanor.   Results for orders placed in visit on 06/29/12  POCT GLYCOSYLATED HEMOGLOBIN (HGB A1C)      Component Value  Range   Hemoglobin A1C 7.3     Her last A1c in July was 8.1   Assessment and Plan: 1. Diabetes mellitus, type 2  POCT glycosylated hemoglobin (Hb A1C)  2. Breast mass  [facility] [employer]  3. Hip pain  cyclobenzaprine (FLEXERIL) 10 MG tablet  4. Insomnia    5. Nausea  ondansetron (ZOFRAN-ODT) 8 MG disintegrating tablet   DM control is ok- continue better diet choices.   Suspect that her hip pain is due to her calcific tendonitis and might be helped by ortho ?injection.  At the moment she wants to finish recovering from her recent illness and deal with her breast issue. However, she would like to see ortho in the near future to work on this issue further.  For the time being will try some flexeril at night as needed.  Cautioned her against using with her klonopin as it could cause excessive sedation. She will take a 1/2 klonopin around dinner time and then take a flexeril at bedtime.   See patient instructions for more details.     Abbe Amsterdam, MD

## 2012-06-29 NOTE — Patient Instructions (Addendum)
We will give you a call regarding your mammogram Use the flexeril as needed for hip pain.  When you are ready to see an orthopedist about this problem please give me a call.   Be careful of sedation with the flexeril.    Let's recheck in about 3 months for your diabetes-  Your A1c is better today- continue to work on a lower fat, lower calorie diet.

## 2012-06-30 ENCOUNTER — Other Ambulatory Visit: Payer: Self-pay | Admitting: Family Medicine

## 2012-06-30 DIAGNOSIS — N63 Unspecified lump in unspecified breast: Secondary | ICD-10-CM

## 2012-07-06 ENCOUNTER — Ambulatory Visit
Admission: RE | Admit: 2012-07-06 | Discharge: 2012-07-06 | Disposition: A | Discharge status: Home / Self Care | Payer: Managed Care, Other (non HMO) | Source: Ambulatory Visit | Attending: Family Medicine | Admitting: Family Medicine

## 2012-07-06 DIAGNOSIS — N63 Unspecified lump in unspecified breast: Secondary | ICD-10-CM

## 2012-08-23 ENCOUNTER — Other Ambulatory Visit: Payer: Self-pay | Admitting: Physician Assistant

## 2012-08-23 ENCOUNTER — Other Ambulatory Visit: Payer: Self-pay | Admitting: Family Medicine

## 2012-09-20 ENCOUNTER — Other Ambulatory Visit: Payer: Self-pay | Admitting: Radiology

## 2012-09-20 MED ORDER — GLIPIZIDE ER 10 MG PO TB24: 10.0000 mg | ORAL_TABLET | Freq: Every day | ORAL | Status: DC

## 2012-10-04 ENCOUNTER — Telehealth: Payer: Self-pay

## 2012-10-04 MED ORDER — CYCLOBENZAPRINE HCL 10 MG PO TABS: 10.0000 mg | ORAL_TABLET | Freq: Every evening | ORAL | Status: DC | PRN

## 2012-10-04 NOTE — Telephone Encounter (Signed)
Rx sent to pharmacy   

## 2012-10-04 NOTE — Telephone Encounter (Signed)
[facility] sent a fax for a refill on Flexeril 10 mg. Please advise.

## 2012-11-09 ENCOUNTER — Ambulatory Visit (INDEPENDENT_AMBULATORY_CARE_PROVIDER_SITE_OTHER): Payer: Managed Care, Other (non HMO) | Admitting: Family Medicine

## 2012-11-09 VITALS — BP 130/96 | HR 120 | Temp 98.1°F | Resp 18 | Ht 64.5 in | Wt 214.0 lb

## 2012-11-09 DIAGNOSIS — E669 Obesity, unspecified: Secondary | ICD-10-CM

## 2012-11-09 DIAGNOSIS — E1165 Type 2 diabetes mellitus with hyperglycemia: Secondary | ICD-10-CM | POA: Insufficient documentation

## 2012-11-09 DIAGNOSIS — Z720 Tobacco use: Secondary | ICD-10-CM

## 2012-11-09 DIAGNOSIS — IMO0002 Reserved for concepts with insufficient information to code with codable children: Secondary | ICD-10-CM | POA: Insufficient documentation

## 2012-11-09 DIAGNOSIS — K589 Irritable bowel syndrome without diarrhea: Secondary | ICD-10-CM | POA: Insufficient documentation

## 2012-11-09 DIAGNOSIS — F3289 Other specified depressive episodes: Secondary | ICD-10-CM

## 2012-11-09 DIAGNOSIS — F32A Depression, unspecified: Secondary | ICD-10-CM | POA: Insufficient documentation

## 2012-11-09 DIAGNOSIS — F329 Major depressive disorder, single episode, unspecified: Secondary | ICD-10-CM

## 2012-11-09 DIAGNOSIS — F418 Other specified anxiety disorders: Secondary | ICD-10-CM

## 2012-11-09 DIAGNOSIS — IMO0001 Reserved for inherently not codable concepts without codable children: Secondary | ICD-10-CM | POA: Insufficient documentation

## 2012-11-09 DIAGNOSIS — R030 Elevated blood-pressure reading, without diagnosis of hypertension: Secondary | ICD-10-CM

## 2012-11-09 DIAGNOSIS — I10 Essential (primary) hypertension: Secondary | ICD-10-CM

## 2012-11-09 DIAGNOSIS — F419 Anxiety disorder, unspecified: Secondary | ICD-10-CM | POA: Insufficient documentation

## 2012-11-09 DIAGNOSIS — F17200 Nicotine dependence, unspecified, uncomplicated: Secondary | ICD-10-CM

## 2012-11-09 DIAGNOSIS — F411 Generalized anxiety disorder: Secondary | ICD-10-CM

## 2012-11-09 LAB — COMPREHENSIVE METABOLIC PANEL
Albumin: 3.9 g/dL (ref 3.5–5.2)
CO2: 25 mEq/L (ref 19–32)
Calcium: 8.7 mg/dL (ref 8.4–10.5)
Chloride: 99 mEq/L (ref 96–112)
Glucose, Bld: 251 mg/dL — ABNORMAL HIGH (ref 70–99)
Sodium: 135 mEq/L (ref 135–145)
Total Bilirubin: 0.5 mg/dL (ref 0.3–1.2)
Total Protein: 7.1 g/dL (ref 6.0–8.3)

## 2012-11-09 LAB — LIPID PANEL
Cholesterol: 189 mg/dL (ref 0–200)
Triglycerides: 258 mg/dL — ABNORMAL HIGH (ref <150)
VLDL: 52 mg/dL — ABNORMAL HIGH (ref 0–40)

## 2012-11-09 MED ORDER — LISINOPRIL 10 MG PO TABS: 10.0000 mg | ORAL_TABLET | Freq: Every day | ORAL | Status: DC

## 2012-11-09 MED ORDER — GLIPIZIDE ER 10 MG PO TB24: ORAL_TABLET | ORAL | Status: DC

## 2012-11-09 MED ORDER — SERTRALINE HCL 100 MG PO TABS: ORAL_TABLET | ORAL | Status: DC

## 2012-11-09 MED ORDER — CYCLOBENZAPRINE HCL 10 MG PO TABS: 10.0000 mg | ORAL_TABLET | Freq: Every evening | ORAL | Status: DC | PRN

## 2012-11-09 MED ORDER — METFORMIN HCL 1000 MG PO TABS: 1000.0000 mg | ORAL_TABLET | Freq: Every day | ORAL | Status: DC

## 2012-11-09 MED ORDER — DICYCLOMINE HCL 20 MG PO TABS: 20.0000 mg | ORAL_TABLET | Freq: Every day | ORAL | Status: DC

## 2012-11-09 NOTE — Patient Instructions (Addendum)
Meghan Park (Licensed Psychological Associate (M.A.)) [phone] [phone]  Increase sertraline to 50 mg in a.m. (one half of 100) and 100 mg at night as previously Increase from side to 5 mg in a.m. and 10 mg at night Begin lisinopril 10 mg one each morning  Work hard on the tobacco problem. Take a quit date as we discussed and start working toward that.  Continue seeing your pastoral counselor. If you desire further counseling [name] contact info is above  Return at any time if abruptly worse, however I think he should be rechecked in about a month  Walk daily

## 2012-11-09 NOTE — Progress Notes (Addendum)
Subjective: Patient been struggling with depression. She has a long history of anxiety and depression. Over the last few weeks however she is not been sleeping well, has been crying a lot, wants and all the time. She has been having stresses at work. However also her biggest concern is been a factor 42 year old orders getting ready to move out of the home in June and be independent, and she is grieving this considerably. She says her marriage of 23 years is about the best it's ever been. She also has a son in the home. She has not been getting regular exercise. She's been smoking about a pack of cigarettes a day. She's been spending time talking with her pastor at church every Wednesday night after church who have been counseling with her. She does have headaches. No major dizziness. And on the left side of the chest pain. She is not gotten any GI or GU complaints.  Objective: Overweight white female who appears depressed. She has gained weight considerably in the last 6 months. She smells hurts. Her TMs are normal. Throat clear. Neck supple without nodes or thyromegaly. No carotid bruits. Chest is clear to auscultation. Heart regular without murmurs. Abdomen soft without mass or tenderness. He looks dejected.  Assessment: Depression Anxiety Tobacco abuse Obesity with weight gain Type 2 diabetes mellitus Hypertension  Plan: Check C. met, hemoglobin A1c, lipids. Results for orders placed in visit on 11/09/12  POCT GLYCOSYLATED HEMOGLOBIN (HGB A1C)      Result Value Range   Hemoglobin A1C 8.2     Will increase the sertraline. Urged her to set a goal date for quitting smoking and to get rid of the cigarettes. Had a 5 minute discussion with her need to get little him. She needs to do regular exercise, walking daily. See medicine orders and instructions.

## 2012-11-10 ENCOUNTER — Encounter: Payer: Self-pay | Admitting: Family Medicine

## 2013-01-06 ENCOUNTER — Ambulatory Visit (INDEPENDENT_AMBULATORY_CARE_PROVIDER_SITE_OTHER): Payer: Managed Care, Other (non HMO) | Admitting: Family Medicine

## 2013-01-06 VITALS — BP 113/75 | HR 102 | Temp 98.2°F | Resp 16 | Ht 63.75 in | Wt 207.4 lb

## 2013-01-06 DIAGNOSIS — F411 Generalized anxiety disorder: Secondary | ICD-10-CM

## 2013-01-06 DIAGNOSIS — F3289 Other specified depressive episodes: Secondary | ICD-10-CM

## 2013-01-06 DIAGNOSIS — F329 Major depressive disorder, single episode, unspecified: Secondary | ICD-10-CM

## 2013-01-06 DIAGNOSIS — E119 Type 2 diabetes mellitus without complications: Secondary | ICD-10-CM

## 2013-01-06 DIAGNOSIS — F32A Depression, unspecified: Secondary | ICD-10-CM

## 2013-01-06 LAB — POCT URINALYSIS DIPSTICK
Bilirubin, UA: NEGATIVE
Blood, UA: NEGATIVE
Glucose, UA: 100
Leukocytes, UA: NEGATIVE
Nitrite, UA: NEGATIVE
Protein, UA: NEGATIVE
Spec Grav, UA: 1.01
Urobilinogen, UA: 0.2
pH, UA: 5

## 2013-01-06 LAB — POCT UA - MICROSCOPIC ONLY
Casts, Ur, LPF, POC: NEGATIVE
Crystals, Ur, HPF, POC: NEGATIVE
Mucus, UA: NEGATIVE
Yeast, UA: NEGATIVE

## 2013-01-06 LAB — COMPREHENSIVE METABOLIC PANEL
ALT: 20 U/L (ref 0–35)
AST: 25 U/L (ref 0–37)
Albumin: 4.3 g/dL (ref 3.5–5.2)
Alkaline Phosphatase: 85 U/L (ref 39–117)
BUN: 13 mg/dL (ref 6–23)
CO2: 24 mEq/L (ref 19–32)
Calcium: 9 mg/dL (ref 8.4–10.5)
Chloride: 103 mEq/L (ref 96–112)
Creat: 0.74 mg/dL (ref 0.50–1.10)
Glucose, Bld: 134 mg/dL — ABNORMAL HIGH (ref 70–99)
Potassium: 4.5 mEq/L (ref 3.5–5.3)
Sodium: 136 mEq/L (ref 135–145)
Total Bilirubin: 0.4 mg/dL (ref 0.3–1.2)
Total Protein: 7.3 g/dL (ref 6.0–8.3)

## 2013-01-06 LAB — GLUCOSE, POCT (MANUAL RESULT ENTRY): POC Glucose: 119 mg/dl — AB (ref 70–99)

## 2013-01-06 MED ORDER — BUSPIRONE HCL 15 MG PO TABS: 15.0000 mg | ORAL_TABLET | Freq: Three times a day (TID) | ORAL | Status: DC

## 2013-01-06 NOTE — Progress Notes (Signed)
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 This been stressful for the patient and she had a panic attack yesterday. He finds that her nerves are getting better for much of the time now.  [facility] where a [facility] tract infection was diagnosed. At the time she was found in his sugar and ketones in her urine and was told to followup.  Objective: Alert and in no acute distress. Patient is appropriate and not suicidal.  No CVA tenderness. Results for orders placed in visit on 01/06/13  GLUCOSE, POCT (MANUAL RESULT ENTRY)      Result Value Range   POC Glucose 119 (*) 70 - 99 mg/dl  POCT URINALYSIS DIPSTICK      Result Value Range   Color, UA yellow     Clarity, UA hazy     Glucose, UA 100     Bilirubin, UA neg     Ketones, UA trace     Spec Grav, UA 1.010     Blood, UA neg     pH, UA 5.0     Protein, UA neg     Urobilinogen, UA 0.2     Nitrite, UA neg     Leukocytes, UA Negative    POCT UA - MICROSCOPIC ONLY      Result Value Range   WBC, Ur, HPF, POC 3-6     RBC, urine, microscopic 4-6     Bacteria, U Microscopic 4+     Mucus, UA neg     Epithelial cells, urine per micros 10-20     Crystals, Ur, HPF, POC neg     Casts, Ur, LPF, POC neg     Yeast, UA neg      Assessment:  Worsening anxiety.  Diabetes controlled.  Plan:  Add buspar 15 bid prn  Continue current other meds.

## 2013-01-07 ENCOUNTER — Telehealth: Payer: Self-pay

## 2013-01-07 NOTE — Telephone Encounter (Signed)
Left message for rtn call.

## 2013-01-07 NOTE — Telephone Encounter (Signed)
Patient returning call to lab for her results (no one was available at the moment) . Patient says she will have her phone on her this time and apologizes for missing the call. Best number: [phone]

## 2013-01-11 ENCOUNTER — Other Ambulatory Visit: Payer: Self-pay | Admitting: Family Medicine

## 2013-03-29 ENCOUNTER — Ambulatory Visit: Payer: Managed Care, Other (non HMO) | Admitting: Internal Medicine

## 2013-03-30 ENCOUNTER — Ambulatory Visit: Payer: Managed Care, Other (non HMO) | Admitting: Internal Medicine

## 2013-04-06 ENCOUNTER — Encounter: Payer: Self-pay | Admitting: Internal Medicine

## 2013-04-06 ENCOUNTER — Ambulatory Visit (INDEPENDENT_AMBULATORY_CARE_PROVIDER_SITE_OTHER): Payer: Managed Care, Other (non HMO) | Admitting: Internal Medicine

## 2013-04-06 ENCOUNTER — Other Ambulatory Visit (INDEPENDENT_AMBULATORY_CARE_PROVIDER_SITE_OTHER): Payer: Managed Care, Other (non HMO)

## 2013-04-06 VITALS — BP 130/80 | HR 116 | Temp 97.9°F | Wt 218.2 lb

## 2013-04-06 DIAGNOSIS — E1140 Type 2 diabetes mellitus with diabetic neuropathy, unspecified: Secondary | ICD-10-CM

## 2013-04-06 DIAGNOSIS — E1149 Type 2 diabetes mellitus with other diabetic neurological complication: Secondary | ICD-10-CM

## 2013-04-06 DIAGNOSIS — F341 Dysthymic disorder: Secondary | ICD-10-CM

## 2013-04-06 DIAGNOSIS — L309 Dermatitis, unspecified: Secondary | ICD-10-CM

## 2013-04-06 DIAGNOSIS — I10 Essential (primary) hypertension: Secondary | ICD-10-CM

## 2013-04-06 DIAGNOSIS — E785 Hyperlipidemia, unspecified: Secondary | ICD-10-CM

## 2013-04-06 DIAGNOSIS — IMO0001 Reserved for inherently not codable concepts without codable children: Secondary | ICD-10-CM

## 2013-04-06 DIAGNOSIS — M79643 Pain in unspecified hand: Secondary | ICD-10-CM

## 2013-04-06 DIAGNOSIS — E669 Obesity, unspecified: Secondary | ICD-10-CM

## 2013-04-06 DIAGNOSIS — F418 Other specified anxiety disorders: Secondary | ICD-10-CM

## 2013-04-06 DIAGNOSIS — Z23 Encounter for immunization: Secondary | ICD-10-CM

## 2013-04-06 DIAGNOSIS — E1165 Type 2 diabetes mellitus with hyperglycemia: Secondary | ICD-10-CM

## 2013-04-06 DIAGNOSIS — L259 Unspecified contact dermatitis, unspecified cause: Secondary | ICD-10-CM

## 2013-04-06 DIAGNOSIS — M25549 Pain in joints of unspecified hand: Secondary | ICD-10-CM

## 2013-04-06 DIAGNOSIS — IMO0002 Reserved for concepts with insufficient information to code with codable children: Secondary | ICD-10-CM

## 2013-04-06 DIAGNOSIS — E1142 Type 2 diabetes mellitus with diabetic polyneuropathy: Secondary | ICD-10-CM

## 2013-04-06 LAB — COMPREHENSIVE METABOLIC PANEL
Albumin: 3.6 g/dL (ref 3.5–5.2)
BUN: 5 mg/dL — ABNORMAL LOW (ref 6–23)
Calcium: 8.7 mg/dL (ref 8.4–10.5)
Chloride: 101 mEq/L (ref 96–112)
Creatinine, Ser: 0.6 mg/dL (ref 0.4–1.2)
GFR: 126.08 mL/min (ref >60.00)
Glucose, Bld: 239 mg/dL — ABNORMAL HIGH (ref 70–99)
Potassium: 3.8 mEq/L (ref 3.5–5.1)

## 2013-04-06 LAB — MICROALBUMIN / CREATININE URINE RATIO: Microalb Creat Ratio: 2.5 mg/g (ref 0.0–30.0)

## 2013-04-06 LAB — LIPID PANEL
Cholesterol: 179 mg/dL (ref 0–200)
HDL: 42.5 mg/dL (ref >39.00)
Triglycerides: 149 mg/dL (ref 0.0–149.0)

## 2013-04-06 LAB — CBC
Hemoglobin: 11.7 g/dL — ABNORMAL LOW (ref 12.0–15.0)
Platelets: 212 10*3/uL (ref 150.0–400.0)
RBC: 4.22 Mil/uL (ref 3.87–5.11)

## 2013-04-06 LAB — RHEUMATOID FACTOR: Rhuematoid fact SerPl-aCnc: <10 IU/mL (ref <=14)

## 2013-04-06 LAB — SEDIMENTATION RATE: Sed Rate: 47 mm/hr — ABNORMAL HIGH (ref 0–22)

## 2013-04-06 MED ORDER — GABAPENTIN 100 MG PO CAPS: 100.0000 mg | ORAL_CAPSULE | Freq: Every day | ORAL | Status: DC

## 2013-04-06 NOTE — Patient Instructions (Addendum)
Type 2 Diabetes Mellitus, Adult Type 2 diabetes mellitus, often simply referred to as type 2 diabetes, is a long-lasting (chronic) disease. In type 2 diabetes, the pancreas does not make enough insulin (a hormone), the cells are less responsive to the insulin that is made (insulin resistance), or both. Normally, insulin moves sugars from food into the tissue cells. The tissue cells use the sugars for energy. The lack of insulin or the lack of normal response to insulin causes excess sugars to build up in the blood instead of going into the tissue cells. As a result, high blood sugar (hyperglycemia) develops. The effect of high sugar (glucose) levels can cause many complications. Type 2 diabetes was also previously called adult-onset diabetes but it can occur at any age.  RISK FACTORS  A person is predisposed to developing type 2 diabetes if someone in the family has the disease and also has one or more of the following primary risk factors:  Overweight.  An inactive lifestyle.  A history of consistently eating high-calorie foods. Maintaining a normal weight and regular physical activity can reduce the chance of developing type 2 diabetes. SYMPTOMS  A person with type 2 diabetes may not show symptoms initially. The symptoms of type 2 diabetes appear slowly. The symptoms include:  Increased thirst (polydipsia).  Increased urination (polyuria).  Increased urination during the night (nocturia).  Weight loss. This weight loss may be rapid.  Frequent, recurring infections.  Tiredness (fatigue).  Weakness.  Vision changes, such as blurred vision.  Fruity smell to your breath.  Abdominal pain.  Nausea or vomiting.  Cuts or bruises which are slow to heal.  Tingling or numbness in the hands or feet. DIAGNOSIS Type 2 diabetes is frequently not diagnosed until complications of diabetes are present. Type 2 diabetes is diagnosed when symptoms or complications are present and when blood  glucose levels are increased. Your blood glucose level may be checked by one or more of the following blood tests:  A fasting blood glucose test. You will not be allowed to eat for at least 8 hours before a blood sample is taken.  A random blood glucose test. Your blood glucose is checked at any time of the day regardless of when you ate.  A hemoglobin A1c blood glucose test. A hemoglobin A1c test provides information about blood glucose control over the previous 3 months.  An oral glucose tolerance test (OGTT). Your blood glucose is measured after you have not eaten (fasted) for 2 hours and then after you drink a glucose-containing beverage. TREATMENT   You may need to take insulin or diabetes medicine daily to keep blood glucose levels in the desired range.  You will need to match insulin dosing with exercise and healthy food choices. The treatment goal is to maintain the before meal blood sugar (preprandial glucose) level at 70 130 mg/dL. HOME CARE INSTRUCTIONS   Have your hemoglobin A1c level checked twice a year.  Perform daily blood glucose monitoring as directed by your caregiver.  Monitor urine ketones when you are ill and as directed by your caregiver.  Take your diabetes medicine or insulin as directed by your caregiver to maintain your blood glucose levels in the desired range.  Never run out of diabetes medicine or insulin. It is needed every day.  Adjust insulin based on your intake of carbohydrates. Carbohydrates can raise blood glucose levels but need to be included in your diet. Carbohydrates provide vitamins, minerals, and fiber which are an essential part of   a healthy diet. Carbohydrates are found in fruits, vegetables, whole grains, dairy products, legumes, and foods containing added sugars.    Eat healthy foods. Alternate 3 meals with 3 snacks.  Lose weight if overweight.  Carry a medical alert card or wear your medical alert jewelry.  Carry a 15 gram  carbohydrate snack with you at all times to treat low blood glucose (hypoglycemia). Some examples of 15 gram carbohydrate snacks include:  Glucose tablets, 3 or 4   Glucose gel, 15 gram tube  Raisins, 2 tablespoons (24 grams)  Jelly beans, 6  Animal crackers, 8  Regular pop, 4 ounces (120 mL)  Gummy treats, 9  Recognize hypoglycemia. Hypoglycemia occurs with blood glucose levels of 70 mg/dL and below. The risk for hypoglycemia increases when fasting or skipping meals, during or after intense exercise, and during sleep. Hypoglycemia symptoms can include:  Tremors or shakes.  Decreased ability to concentrate.  Sweating.  Increased heart rate.  Headache.  Dry mouth.  Hunger.  Irritability.  Anxiety.  Restless sleep.  Altered speech or coordination.  Confusion.  Treat hypoglycemia promptly. If you are alert and able to safely swallow, follow the 15:15 rule:  Take 15 20 grams of rapid-acting glucose or carbohydrate. Rapid-acting options include glucose gel, glucose tablets, or 4 ounces (120 mL) of fruit juice, regular soda, or low fat milk.  Check your blood glucose level 15 minutes after taking the glucose.  Take 15 20 grams more of glucose if the repeat blood glucose level is still 70 mg/dL or below.  Eat a meal or snack within 1 hour once blood glucose levels return to normal.    Be alert to polyuria and polydipsia which are early signs of hyperglycemia. An early awareness of hyperglycemia allows for prompt treatment. Treat hyperglycemia as directed by your caregiver.  Engage in at least 150 minutes of moderate-intensity physical activity a week, spread over at least 3 days of the week or as directed by your caregiver. In addition, you should engage in resistance exercise at least 2 times a week or as directed by your caregiver.  Adjust your medicine and food intake as needed if you start a new exercise or sport.  Follow your sick day plan at any time you  are unable to eat or drink as usual.  Avoid tobacco use.  Limit alcohol intake to no more than 1 drink per day for nonpregnant women and 2 drinks per day for men. You should drink alcohol only when you are also eating food. Talk with your caregiver whether alcohol is safe for you. Tell your caregiver if you drink alcohol several times a week.  Follow up with your caregiver regularly.  Schedule an eye exam soon after the diagnosis of type 2 diabetes and then annually.  Perform daily skin and foot care. Examine your skin and feet daily for cuts, bruises, redness, nail problems, bleeding, blisters, or sores. A foot exam by a caregiver should be done annually.  Brush your teeth and gums at least twice a day and floss at least once a day. Follow up with your dentist regularly.  Share your diabetes management plan with your workplace or school.  Stay up-to-date with immunizations.  Learn to manage stress.  Obtain ongoing diabetes education and support as needed.  Participate in, or seek rehabilitation as needed to maintain or improve independence and quality of life. Request a physical or occupational therapy referral if you are having foot or hand numbness or difficulties with grooming,   dressing, eating, or physical activity. SEEK MEDICAL CARE IF:   You are unable to eat food or drink fluids for more than 6 hours.  You have nausea and vomiting for more than 6 hours.  Your blood glucose level is over 240 mg/dL.  There is a change in mental status.  You develop an additional serious illness.  You have diarrhea for more than 6 hours.  You have been sick or have had a fever for a couple of days and are not getting better.  You have pain during any physical activity.  SEEK IMMEDIATE MEDICAL CARE IF:  You have difficulty breathing.  You have moderate to large ketone levels. MAKE SURE YOU:  Understand these instructions.  Will watch your condition.  Will get help right away if  you are not doing well or get worse. Document Released: 07/12/2005 Document Revised: 04/05/2012 Document Reviewed: 02/08/2012 [facility] Patient Information 2014 [place], [place]. Hypertension As your heart beats, it forces blood through your arteries. This force is your blood pressure. If the pressure is too high, it is called hypertension (HTN) or high blood pressure. HTN is dangerous because you may have it and not know it. High blood pressure may mean that your heart has to work harder to pump blood. Your arteries may be narrow or stiff. The extra work puts you at risk for heart disease, stroke, and other problems.  Blood pressure consists of two numbers, a higher number over a lower, 110/72, for example. It is stated as "110 over 72." The ideal is below 120 for the top number (systolic) and under 80 for the bottom (diastolic). Write down your blood pressure today. You should pay close attention to your blood pressure if you have certain conditions such as:  Heart failure.  Prior heart attack.  Diabetes  Chronic kidney disease.  Prior stroke.  Multiple risk factors for heart disease. To see if you have HTN, your blood pressure should be measured while you are seated with your arm held at the level of the heart. It should be measured at least twice. A one-time elevated blood pressure reading (especially in the Emergency Department) does not mean that you need treatment. There may be conditions in which the blood pressure is different between your right and left arms. It is important to see your caregiver soon for a recheck. Most people have essential hypertension which means that there is not a specific cause. This type of high blood pressure may be lowered by changing lifestyle factors such as:  Stress.  Smoking.  Lack of exercise.  Excessive weight.  Drug/tobacco/alcohol use.  Eating less salt. Most people do not have symptoms from high blood pressure until it has caused damage to  the body. Effective treatment can often prevent, delay or reduce that damage. TREATMENT  When a cause has been identified, treatment for high blood pressure is directed at the cause. There are a large number of medications to treat HTN. These fall into several categories, and your caregiver will help you select the medicines that are best for you. Medications may have side effects. You should review side effects with your caregiver. If your blood pressure stays high after you have made lifestyle changes or started on medicines,   Your medication(s) may need to be changed.  Other problems may need to be addressed.  Be certain you understand your prescriptions, and know how and when to take your medicine.  Be sure to follow up with your caregiver within the time  frame advised (usually within two weeks) to have your blood pressure rechecked and to review your medications.  If you are taking more than one medicine to lower your blood pressure, make sure you know how and at what times they should be taken. Taking two medicines at the same time can result in blood pressure that is too low. SEEK IMMEDIATE MEDICAL CARE IF:  You develop a severe headache, blurred or changing vision, or confusion.  You have unusual weakness or numbness, or a faint feeling.  You have severe chest or abdominal pain, vomiting, or breathing problems. MAKE SURE YOU:   Understand these instructions.  Will watch your condition.  Will get help right away if you are not doing well or get worse. Document Released: 07/12/2005 Document Revised: 10/04/2011 Document Reviewed: 03/01/2008 [facility] Patient Information 2014 [place], [place].

## 2013-04-06 NOTE — Progress Notes (Addendum)
HPI  Pt presents to the clinic today to establish care. She is transferring care from [facility] urgent care. She does have some concerns today about dermatitis on her back that she got from an allergic reaction to morphine. She has blisters on her back that have scabbed over. They are not red, warm or draining. She is unsure of the best way to care for them. Additionally, she c/o numbness and tingling in her feet. This started a few months ago and seems to be getting worse. She does have diabetes and thinks the neuropathy is related to this.  Flu: 2013 Tetanus: more than 10 years ago Pneumovax: never LMP: hysterectomy Pap smear: 2013 Mammogram: 2013 Eye Doctor: 2014 Dentist: dentures Foot exam: 2013  Past Medical History  Diagnosis Date  . DM (diabetes mellitus), type 2, uncontrolled   . Anxiety associated with depression   . Hip pain, right   . Depression   . Anxiety   . Pneumonia hx  . Cystitis   . Cholelithiasis   . Fatty liver   . Arthritis hip, right  . Insomnia     Current Outpatient Prescriptions  Medication Sig Dispense Refill  . clonazePAM (KLONOPIN) 1 MG tablet TAKE 1/2 TO 1 TABLET BY MOUTH DAILY AS NEEDED FOR ANXIETY  30 tablet  2  . cyclobenzaprine (FLEXERIL) 10 MG tablet Take 1 tablet (10 mg total) by mouth at bedtime as needed for muscle spasms.  30 tablet  4  . dicyclomine (BENTYL) 20 MG tablet Take 1 tablet (20 mg total) by mouth daily.  60 tablet  4  . glipiZIDE (GLUCOTROL XL) 10 MG 24 hr tablet Take one in the morning and 2 in the evening for diabetes  45 tablet  4  . glucose blood test strip 1 each by Other route as needed. Use as instructed      . ibuprofen (ADVIL,MOTRIN) 200 MG tablet Take 400 mg by mouth every 4 (four) hours as needed. For pain      . lisinopril (PRINIVIL,ZESTRIL) 10 MG tablet Take 1 tablet (10 mg total) by mouth daily.  90 tablet  3  . metFORMIN (GLUCOPHAGE) 1000 MG tablet Take 1 tablet (1,000 mg total) by mouth daily with breakfast.  60  tablet  12  . ondansetron (ZOFRAN) 4 MG tablet Take 1 tablet (4 mg total) by mouth every 6 (six) hours.  12 tablet  0  . ondansetron (ZOFRAN-ODT) 8 MG disintegrating tablet Take 1 tablet (8 mg total) by mouth every 8 (eight) hours as needed for nausea.  30 tablet  2  . sertraline (ZOLOFT) 100 MG tablet Take one half pill in a.m. and one pill at nighttime for depression  45 tablet  4   No current facility-administered medications for this visit.    Allergies  Allergen Reactions  . Morphine And Related Dermatitis and Other (See Comments)    Severe itching leading to welts and open wounds.    Family History  Problem Relation Age of Onset  . Anxiety disorder Mother   . Hyperlipidemia Mother   . Arthritis Father   . COPD Father   . Heart disease Father   . Heart disease Maternal Grandfather   . Congestive Heart Failure Maternal Grandmother   . Heart disease Maternal Grandmother   . Heart disease Paternal Grandfather     History   Social History  . Marital Status: Married    Spouse Name: N/A    Number of Children: N/A  . Years of  Education: N/A   Occupational History  . Not on file.   Social History Main Topics  . Smoking status: Current Every Day Smoker -- 0.50 packs/day for 20 years    Types: Cigarettes  . Smokeless tobacco: Not on file  . Alcohol Use: No  . Drug Use: No  . Sexual Activity: Yes    Birth Control/ Protection: Other-see comments     Comment: hysterectomy   Other Topics Concern  . 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  HEENT: Denies eye pain, eye redness, ear pain, ringing in the ears, wax buildup, runny nose, nasal congestion, bloody nose, or sore throat. Respiratory: Denies difficulty breathing, shortness of breath, cough or sputum production.   Cardiovascular: Denies chest pain, chest  tightness, palpitations or swelling in the hands or feet.  Gastrointestinal: Denies abdominal pain, bloating, constipation, diarrhea or blood in the stool.  GU: Denies frequency, urgency, pain with urination, blood in urine, odor or discharge. Musculoskeletal: Pt reports joint pain in hands. Denies decrease in range of motion, difficulty with gait, muscle pain or joint pain and swelling.  Skin: Pt reports blisters on her back. Denies redness, rashes, lesions.  Neurological: Pt reports numbness and tingling in feet. Denies dizziness, difficulty with memory, difficulty with speech or problems with balance and coordination.   No other specific complaints in a complete review of systems (except as listed in HPI above).  PE:  BP 130/80  Pulse 116  Temp(Src) 97.9 F (36.6 C) (Oral)  Wt 218 lb 4 oz (98.998 kg)  BMI 37.77 kg/m2  SpO2 98% Wt Readings from Last 3 Encounters:  04/06/13 218 lb 4 oz (98.998 kg)  01/06/13 207 lb 6.4 oz (94.076 kg)  11/09/12 214 lb (97.07 kg)    General: Appears her stated age, obese but well developed, well nourished in NAD. HEENT: Head: normal shape and size; Eyes: sclera white, no icterus, conjunctiva pink, PERRLA and EOMs intact; Ears: Tm's gray and intact, normal light reflex; Nose: mucosa pink and moist, septum midline; Throat/Mouth: Teeth present, mucosa pink and moist, no lesions or ulcerations noted.  Neck: Normal range of motion. Neck supple, trachea midline. No massses, lumps or thyromegaly present.  Cardiovascular: Normal rate and rhythm. S1,S2 noted.  No murmur, rubs or gallops noted. No JVD or BLE edema. No carotid bruits noted. Pulmonary/Chest: Normal effort and positive vesicular breath sounds with diminished bases. No respiratory distress. No wheezes, rales or ronchi noted.  Abdomen: Soft and nontender. Normal bowel sounds, no bruits noted. No distention or masses noted. Liver, spleen and kidneys non palpable. Musculoskeletal: Normal range of motion.  No signs of joint swelling. No difficulty with gait. Sensation decreased to 10 gm monofilament on lateral edges. Neurological: Alert and oriented. Cranial nerves II-XII intact. Coordination normal. +DTRs bilaterally. Psychiatric: Mood and affect normal. Behavior is normal. Judgment and thought content normal.    BMET    Component Value Date/Time   NA 136 01/06/2013 0952   K 4.5 01/06/2013 0952   CL 103 01/06/2013 0952   CO2 24 01/06/2013 0952   GLUCOSE 134* 01/06/2013 0952   BUN 13 01/06/2013 0952   CREATININE 0.74 01/06/2013 0952   CREATININE 0.46* 05/14/2012 1050   CALCIUM 9.0 01/06/2013 0952   GFRNONAA >90 05/14/2012 1050   GFRAA >90 05/14/2012 1050  Lipid Panel     Component Value Date/Time   CHOL 189 11/09/2012 0832   TRIG 258* 11/09/2012 0832   HDL 43 11/09/2012 0832   CHOLHDL 4.4 11/09/2012 0832   VLDL 52* 11/09/2012 0832   LDLCALC 94 11/09/2012 0832    CBC    Component Value Date/Time   WBC 8.0 05/26/2012 0904   WBC 7.2 05/14/2012 1050   RBC 4.10 05/26/2012 0904   RBC 3.94 05/14/2012 1050   HGB 11.3* 05/26/2012 0904   HGB 11.4* 05/14/2012 1050   HCT 36.7* 05/26/2012 0904   HCT 33.4* 05/14/2012 1050   PLT 233 05/14/2012 1050   MCV 89.5 05/26/2012 0904   MCV 84.8 05/14/2012 1050   MCH 27.6 05/26/2012 0904   MCH 28.9 05/14/2012 1050   MCHC 30.8* 05/26/2012 0904   MCHC 34.1 05/14/2012 1050   RDW 14.5 05/14/2012 1050   LYMPHSABS 2.3 05/14/2012 1050   MONOABS 0.4 05/14/2012 1050   EOSABS 0.2 05/14/2012 1050   BASOSABS 0.0 05/14/2012 1050    Hgb A1C Lab Results  Component Value Date   HGBA1C 8.2 11/09/2012     Assessment and Plan:  Prevent Health Maintenance:  Will get flu, tdap and pneumovax Smoking cessation counseling given UTD on all HM  Dermatitis of back secondary to allergic reaction:  Bactroban to open sites and cover with a band aid  RTC in 3 years or sooner if needed

## 2013-04-06 NOTE — Assessment & Plan Note (Signed)
Well controlled on current therapy Will check CBC and BMET today

## 2013-04-06 NOTE — Assessment & Plan Note (Signed)
Controlled on Zoloft and Klonipin Consider CBT

## 2013-04-06 NOTE — Assessment & Plan Note (Signed)
Will start Neurontin 100 mg QD and titrate as needed

## 2013-04-06 NOTE — Progress Notes (Addendum)
HPI: Pt presents to office today to establish care. Patient transferring from Urgent Care. Pt had concerns today about some burning and tingling in her bilateral feet. Pt reports frequent tingling, burning, and loss of sensation to feet. Pt also reports concerns with skin on upper portion of back. She reports being given Morphine for surgery and had an allergic reaction, now has open red areas from scratching and picking at them. Pt also reports chronic right hip and hand pain. Pt has history of arthritis.  Pap Smear: 06/2012 Mammogram: 06/2012 DM Eye exam: 08/2012 DM Foot exam: 10/2012 Tetanus: 04/06/2013 Flu: 04/06/2013 PNA vaccine: 04/06/2013 Dentist: 2012   Past Medical History  Diagnosis Date  . DM (diabetes mellitus), type 2, uncontrolled   . Anxiety associated with depression   . Hip pain, right   . Depression   . Anxiety   . Pneumonia hx  . Cystitis   . Cholelithiasis   . Fatty liver   . Arthritis hip, right  . Insomnia   . Hypertension   . Nausea     Current Outpatient Prescriptions  Medication Sig Dispense Refill  . clonazePAM (KLONOPIN) 1 MG tablet TAKE 1/2 TO 1 TABLET BY MOUTH DAILY AS NEEDED FOR ANXIETY  30 tablet  2  . cyclobenzaprine (FLEXERIL) 10 MG tablet Take 1 tablet (10 mg total) by mouth at bedtime as needed for muscle spasms.  30 tablet  4  . DICLOFENAC PO Take 75 mg by mouth daily.      [name] Kitchen dicyclomine (BENTYL) 20 MG tablet Take 1 tablet (20 mg total) by mouth daily.  60 tablet  4  . glipiZIDE (GLUCOTROL XL) 10 MG 24 hr tablet Take one in the morning and 2 in the evening for diabetes  45 tablet  4  . glucose blood test strip 1 each by Other route as needed. Use as instructed      . ibuprofen (ADVIL,MOTRIN) 200 MG tablet Take 400 mg by mouth every 4 (four) hours as needed. For pain      . lisinopril (PRINIVIL,ZESTRIL) 10 MG tablet Take 1 tablet (10 mg total) by mouth daily.  90 tablet  3  . metFORMIN (GLUCOPHAGE) 1000 MG tablet Take 1 tablet (1,000 mg total) by  mouth daily with breakfast.  60 tablet  12  . ondansetron (ZOFRAN) 4 MG tablet Take 1 tablet (4 mg total) by mouth every 6 (six) hours.  12 tablet  0  . ondansetron (ZOFRAN-ODT) 8 MG disintegrating tablet Take 1 tablet (8 mg total) by mouth every 8 (eight) hours as needed for nausea.  30 tablet  2  . sertraline (ZOLOFT) 100 MG tablet Take one half pill in a.m. and one pill at nighttime for depression  45 tablet  4  . gabapentin (NEURONTIN) 100 MG capsule Take 1 capsule (100 mg total) by mouth daily.  90 capsule  3   No current facility-administered medications for this visit.    Allergies  Allergen Reactions  . Morphine And Related Dermatitis and Other (See Comments)    Severe itching leading to welts and open wounds.    Family History  Problem Relation Age of Onset  . Anxiety disorder Mother   . Hyperlipidemia Mother   . Arthritis Father   . COPD Father   . Heart disease Father   . Heart disease Maternal Grandfather   . Congestive Heart Failure Maternal Grandmother   . Heart disease Maternal Grandmother   . Heart disease Paternal Grandfather  History   Social History  . Marital Status: Married    Spouse Name: N/A    Number of Children: N/A  . Years of Education: N/A   Occupational History  . Not on file.   Social History Main Topics  . Smoking status: Current Every Day Smoker -- 0.50 packs/day for 20 years    Types: Cigarettes  . Smokeless tobacco: Not on file  . Alcohol Use: No  . Drug Use: No  . Sexual Activity: Yes    Birth Control/ Protection: Other-see comments     Comment: hysterectomy   Other Topics Concern  . 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  HEENT: Denies eye pain, eye redness, ear pain, ringing in the ears, wax buildup, runny nose, nasal congestion, bloody nose, or sore  throat. Respiratory: Denies difficulty breathing, shortness of breath, cough or sputum production.   Cardiovascular: Denies chest pain, chest tightness, palpitations or swelling in the hands or feet.  Gastrointestinal: Endorses occasional nausea and constipation. Denies abdominal pain, bloating, diarrhea or blood in the stool.  GU: Denies frequency, urgency, pain with urination, blood in urine, odor or discharge. Musculoskeletal: Endorses difficulty in am with walking due to numbness in bilateral feet, hip and hand pain and joint swelling.  Denies decrease in range of motion.  Skin: Endorses rash, redness, itching, and open lesions to upper back.  Neurological: Endorses numbness and tingling to bilateral feet. Denies dizziness, difficulty with memory, difficulty with speech or problems with balance and coordination.   No other specific complaints in a complete review of systems (except as listed in HPI above).  PE:  BP 130/80  Pulse 116  Temp(Src) 97.9 F (36.6 C) (Oral)  Wt 218 lb 4 oz (98.998 kg)  BMI 37.77 kg/m2  SpO2 98% Wt Readings from Last 3 Encounters:  04/06/13 218 lb 4 oz (98.998 kg)  01/06/13 207 lb 6.4 oz (94.076 kg)  11/09/12 214 lb (97.07 kg)    General: Appears their stated age, well developed, well nourished in NAD. HEENT: Head: normal shape and size; Eyes: sclera white, no icterus, conjunctiva pink, PERRLA and EOMs intact; Ears: Tm's gray and intact, normal light reflex; Nose: mucosa pink and moist, septum midline; Throat/Mouth: Teeth present, mucosa pink and moist, no lesions or ulcerations noted.  Neck: Normal range of motion. Neck supple, trachea midline. No massses, lumps or thyromegaly present.  Skin: Multiple open, red and inflamed areas to upper back. Skin turgor intact, no tenting. Cardiovascular: Normal rate and rhythm. S1,S2 noted.  No murmur, rubs or gallops noted. No JVD or BLE edema. No carotid bruits noted. Pulmonary/Chest: Normal effort and positive  vesicular breath sounds. No respiratory distress. No wheezes, rales or ronchi noted.  Abdomen: Soft and nontender. Normal bowel sounds, no bruits noted. No distention or masses noted. Liver, spleen and kidneys non palpable. Musculoskeletal: Normal range of motion. No signs of joint swelling. No difficulty with gait.  Neurological: Alert and oriented. Cranial nerves II-XII intact. Coordination normal. +DTRs bilaterally. Sensation decreased to lateral bilateral feet to monofilament exam. Psychiatric: Mood and affect normal. Behavior is normal. Judgment and thought content normal.    BMET    Component Value Date/Time   NA 136 01/06/2013 0952   K 4.5 01/06/2013 0952   CL 103 01/06/2013 0952   CO2 24  01/06/2013 0952   GLUCOSE 134* 01/06/2013 0952   BUN 13 01/06/2013 0952   CREATININE 0.74 01/06/2013 0952   CREATININE 0.46* 05/14/2012 1050   CALCIUM 9.0 01/06/2013 0952   GFRNONAA >90 05/14/2012 1050   GFRAA >90 05/14/2012 1050    Lipid Panel     Component Value Date/Time   CHOL 189 11/09/2012 0832   TRIG 258* 11/09/2012 0832   HDL 43 11/09/2012 0832   CHOLHDL 4.4 11/09/2012 0832   VLDL 52* 11/09/2012 0832   LDLCALC 94 11/09/2012 0832    CBC    Component Value Date/Time   WBC 8.0 05/26/2012 0904   WBC 7.2 05/14/2012 1050   RBC 4.10 05/26/2012 0904   RBC 3.94 05/14/2012 1050   HGB 11.3* 05/26/2012 0904   HGB 11.4* 05/14/2012 1050   HCT 36.7* 05/26/2012 0904   HCT 33.4* 05/14/2012 1050   PLT 233 05/14/2012 1050   MCV 89.5 05/26/2012 0904   MCV 84.8 05/14/2012 1050   MCH 27.6 05/26/2012 0904   MCH 28.9 05/14/2012 1050   MCHC 30.8* 05/26/2012 0904   MCHC 34.1 05/14/2012 1050   RDW 14.5 05/14/2012 1050   LYMPHSABS 2.3 05/14/2012 1050   MONOABS 0.4 05/14/2012 1050   EOSABS 0.2 05/14/2012 1050   BASOSABS 0.0 05/14/2012 1050    Hgb A1C Lab Results  Component Value Date   HGBA1C 8.2 11/09/2012     Assessment and Plan: Diabetes Mellitis Type II Uncontrolled and Peripheral  Neuropathy Check patients A1C level and urine microalbumin Educated on diet and exercise Prescribed Gabapentin 100mg  1 tablet PO daily Refilled glipizide  Hypertension and Hyperlipidemia Checked lipid panel and CBC/BMET Blood pressure well controlled on Lisinopril; continue current treatment Recommended low dose 81mg  ASA 1 tablet PO daily If lipid levels elevated, consider starting a statin  Dermatitis: Recommended OTC bactriband ointment twice daily and cover areas Will follow up with areas of concerns at next visit, call in one week if no improvement or worsening  Arthritis Checked Rh factor for Rheumatoid  Anxiety and Depression: Discussed current prescribed medications  Addressed concerns with anxiety such as panic attacks. Pt wishes to continue current treatment plan and address as needed  Health Maintenance: Gave TDAP, Flu and PNA vaccine Checked complete blood levels Smoking cessation counseling provided. Pt currently ready to quit smoking, using a vapor cigarette currently.  Will continue to support and educate.  Follow up in 3 months or prior to review lab results  Trell Secrist, Jacques Earthly, Student-NP

## 2013-04-06 NOTE — Assessment & Plan Note (Signed)
Maintain a 1400 calorie diet and start aerobic exercise for at least 30 minutes 3 x week Consider referral to nutrition(as pt has diabetes also)

## 2013-04-06 NOTE — Assessment & Plan Note (Addendum)
Will check A1C and Microalbumin today Will adjust meds accordiingly

## 2013-04-06 NOTE — Assessment & Plan Note (Signed)
Currently not on any therapy Will check lipid profile today If elevated, will start simvistatin Consider adding baby aspirin daily

## 2013-04-11 ENCOUNTER — Encounter: Payer: Self-pay | Admitting: Internal Medicine

## 2013-04-16 ENCOUNTER — Encounter: Payer: Self-pay | Admitting: Internal Medicine

## 2013-04-16 DIAGNOSIS — IMO0001 Reserved for inherently not codable concepts without codable children: Secondary | ICD-10-CM

## 2013-04-16 MED ORDER — CYCLOBENZAPRINE HCL 10 MG PO TABS: 10.0000 mg | ORAL_TABLET | Freq: Every evening | ORAL | Status: DC | PRN

## 2013-04-20 ENCOUNTER — Ambulatory Visit: Payer: Managed Care, Other (non HMO) | Admitting: Internal Medicine

## 2013-04-27 ENCOUNTER — Ambulatory Visit (INDEPENDENT_AMBULATORY_CARE_PROVIDER_SITE_OTHER): Payer: Managed Care, Other (non HMO) | Admitting: Internal Medicine

## 2013-04-27 ENCOUNTER — Encounter: Payer: Self-pay | Admitting: Internal Medicine

## 2013-04-27 VITALS — BP 138/90 | HR 124 | Temp 98.1°F | Wt 216.0 lb

## 2013-04-27 DIAGNOSIS — E1142 Type 2 diabetes mellitus with diabetic polyneuropathy: Secondary | ICD-10-CM

## 2013-04-27 DIAGNOSIS — IMO0002 Reserved for concepts with insufficient information to code with codable children: Secondary | ICD-10-CM

## 2013-04-27 DIAGNOSIS — IMO0001 Reserved for inherently not codable concepts without codable children: Secondary | ICD-10-CM

## 2013-04-27 DIAGNOSIS — E1140 Type 2 diabetes mellitus with diabetic neuropathy, unspecified: Secondary | ICD-10-CM

## 2013-04-27 DIAGNOSIS — E1165 Type 2 diabetes mellitus with hyperglycemia: Secondary | ICD-10-CM

## 2013-04-27 DIAGNOSIS — E1149 Type 2 diabetes mellitus with other diabetic neurological complication: Secondary | ICD-10-CM

## 2013-04-27 NOTE — Patient Instructions (Signed)
Type 2 Diabetes Mellitus, Adult Type 2 diabetes mellitus, often simply referred to as type 2 diabetes, is a long-lasting (chronic) disease. In type 2 diabetes, the pancreas does not make enough insulin (a hormone), the cells are less responsive to the insulin that is made (insulin resistance), or both. Normally, insulin moves sugars from food into the tissue cells. The tissue cells use the sugars for energy. The lack of insulin or the lack of normal response to insulin causes excess sugars to build up in the blood instead of going into the tissue cells. As a result, high blood sugar (hyperglycemia) develops. The effect of high sugar (glucose) levels can cause many complications. Type 2 diabetes was also previously called adult-onset diabetes but it can occur at any age.  RISK FACTORS  A person is predisposed to developing type 2 diabetes if someone in the family has the disease and also has one or more of the following primary risk factors:  Overweight.  An inactive lifestyle.  A history of consistently eating high-calorie foods. Maintaining a normal weight and regular physical activity can reduce the chance of developing type 2 diabetes. SYMPTOMS  A person with type 2 diabetes may not show symptoms initially. The symptoms of type 2 diabetes appear slowly. The symptoms include:  Increased thirst (polydipsia).  Increased urination (polyuria).  Increased urination during the night (nocturia).  Weight loss. This weight loss may be rapid.  Frequent, recurring infections.  Tiredness (fatigue).  Weakness.  Vision changes, such as blurred vision.  Fruity smell to your breath.  Abdominal pain.  Nausea or vomiting.  Cuts or bruises which are slow to heal.  Tingling or numbness in the hands or feet. DIAGNOSIS Type 2 diabetes is frequently not diagnosed until complications of diabetes are present. Type 2 diabetes is diagnosed when symptoms or complications are present and when blood  glucose levels are increased. Your blood glucose level may be checked by one or more of the following blood tests:  A fasting blood glucose test. You will not be allowed to eat for at least 8 hours before a blood sample is taken.  A random blood glucose test. Your blood glucose is checked at any time of the day regardless of when you ate.  A hemoglobin A1c blood glucose test. A hemoglobin A1c test provides information about blood glucose control over the previous 3 months.  An oral glucose tolerance test (OGTT). Your blood glucose is measured after you have not eaten (fasted) for 2 hours and then after you drink a glucose-containing beverage. TREATMENT   You may need to take insulin or diabetes medicine daily to keep blood glucose levels in the desired range.  You will need to match insulin dosing with exercise and healthy food choices. The treatment goal is to maintain the before meal blood sugar (preprandial glucose) level at 70 130 mg/dL. HOME CARE INSTRUCTIONS   Have your hemoglobin A1c level checked twice a year.  Perform daily blood glucose monitoring as directed by your caregiver.  Monitor urine ketones when you are ill and as directed by your caregiver.  Take your diabetes medicine or insulin as directed by your caregiver to maintain your blood glucose levels in the desired range.  Never run out of diabetes medicine or insulin. It is needed every day.  Adjust insulin based on your intake of carbohydrates. Carbohydrates can raise blood glucose levels but need to be included in your diet. Carbohydrates provide vitamins, minerals, and fiber which are an essential part of   a healthy diet. Carbohydrates are found in fruits, vegetables, whole grains, dairy products, legumes, and foods containing added sugars.    Eat healthy foods. Alternate 3 meals with 3 snacks.  Lose weight if overweight.  Carry a medical alert card or wear your medical alert jewelry.  Carry a 15 gram  carbohydrate snack with you at all times to treat low blood glucose (hypoglycemia). Some examples of 15 gram carbohydrate snacks include:  Glucose tablets, 3 or 4   Glucose gel, 15 gram tube  Raisins, 2 tablespoons (24 grams)  Jelly beans, 6  Animal crackers, 8  Regular pop, 4 ounces (120 mL)  Gummy treats, 9  Recognize hypoglycemia. Hypoglycemia occurs with blood glucose levels of 70 mg/dL and below. The risk for hypoglycemia increases when fasting or skipping meals, during or after intense exercise, and during sleep. Hypoglycemia symptoms can include:  Tremors or shakes.  Decreased ability to concentrate.  Sweating.  Increased heart rate.  Headache.  Dry mouth.  Hunger.  Irritability.  Anxiety.  Restless sleep.  Altered speech or coordination.  Confusion.  Treat hypoglycemia promptly. If you are alert and able to safely swallow, follow the 15:15 rule:  Take 15 20 grams of rapid-acting glucose or carbohydrate. Rapid-acting options include glucose gel, glucose tablets, or 4 ounces (120 mL) of fruit juice, regular soda, or low fat milk.  Check your blood glucose level 15 minutes after taking the glucose.  Take 15 20 grams more of glucose if the repeat blood glucose level is still 70 mg/dL or below.  Eat a meal or snack within 1 hour once blood glucose levels return to normal.    Be alert to polyuria and polydipsia which are early signs of hyperglycemia. An early awareness of hyperglycemia allows for prompt treatment. Treat hyperglycemia as directed by your caregiver.  Engage in at least 150 minutes of moderate-intensity physical activity a week, spread over at least 3 days of the week or as directed by your caregiver. In addition, you should engage in resistance exercise at least 2 times a week or as directed by your caregiver.  Adjust your medicine and food intake as needed if you start a new exercise or sport.  Follow your sick day plan at any time you  are unable to eat or drink as usual.  Avoid tobacco use.  Limit alcohol intake to no more than 1 drink per day for nonpregnant women and 2 drinks per day for men. You should drink alcohol only when you are also eating food. Talk with your caregiver whether alcohol is safe for you. Tell your caregiver if you drink alcohol several times a week.  Follow up with your caregiver regularly.  Schedule an eye exam soon after the diagnosis of type 2 diabetes and then annually.  Perform daily skin and foot care. Examine your skin and feet daily for cuts, bruises, redness, nail problems, bleeding, blisters, or sores. A foot exam by a caregiver should be done annually.  Brush your teeth and gums at least twice a day and floss at least once a day. Follow up with your dentist regularly.  Share your diabetes management plan with your workplace or school.  Stay up-to-date with immunizations.  Learn to manage stress.  Obtain ongoing diabetes education and support as needed.  Participate in, or seek rehabilitation as needed to maintain or improve independence and quality of life. Request a physical or occupational therapy referral if you are having foot or hand numbness or difficulties with grooming,   dressing, eating, or physical activity. SEEK MEDICAL CARE IF:   You are unable to eat food or drink fluids for more than 6 hours.  You have nausea and vomiting for more than 6 hours.  Your blood glucose level is over 240 mg/dL.  There is a change in mental status.  You develop an additional serious illness.  You have diarrhea for more than 6 hours.  You have been sick or have had a fever for a couple of days and are not getting better.  You have pain during any physical activity.  SEEK IMMEDIATE MEDICAL CARE IF:  You have difficulty breathing.  You have moderate to large ketone levels. MAKE SURE YOU:  Understand these instructions.  Will watch your condition.  Will get help right away if  you are not doing well or get worse. Document Released: 07/12/2005 Document Revised: 04/05/2012 Document Reviewed: 02/08/2012 [employer] Patient Information 2014 [employer].  

## 2013-04-27 NOTE — Assessment & Plan Note (Addendum)
Would like to take 3 months to really work on diet and exercise Would not like to add another oral medication or long acting insulin She has been able to get it down with lifestyle changes in the past Continue current medication therapy  RTC in 3 months to recheck A1C

## 2013-04-27 NOTE — Assessment & Plan Note (Signed)
Controlled with Neurontin-continue

## 2013-04-27 NOTE — Progress Notes (Signed)
Subjective:    Patient ID: Meghan Park, female    DOB: 1971/06/11, 42 y.o.   MRN: [id number]  HPI  Pt presents to the clinic today to discuss her diabetes medications. Her last A1C was 9.8%. She is on Meformin 1000 mg BID, Glipizide 10 mg in am 20 mg in pm. She has been under a lot of stress over this past summer. When she is stressed she tends to eat more. She has also cut back on her exercising because she has been so busy. She does report noncompliance with diet.  Review of Systems      Past Medical History  Diagnosis Date  . DM (diabetes mellitus), type 2, uncontrolled   . Anxiety associated with depression   . Hip pain, right   . Depression   . Anxiety   . Pneumonia hx  . Cystitis   . Cholelithiasis   . Fatty liver   . Arthritis hip, right  . Insomnia   . Hypertension   . Nausea     Current Outpatient Prescriptions  Medication Sig Dispense Refill  . clonazePAM (KLONOPIN) 1 MG tablet TAKE 1/2 TO 1 TABLET BY MOUTH DAILY AS NEEDED FOR ANXIETY  30 tablet  2  . cyclobenzaprine (FLEXERIL) 10 MG tablet Take 1 tablet (10 mg total) by mouth at bedtime as needed for muscle spasms.  30 tablet  4  . DICLOFENAC PO Take 75 mg by mouth daily.      [name] Kitchen dicyclomine (BENTYL) 20 MG tablet Take 1 tablet (20 mg total) by mouth daily.  60 tablet  4  . gabapentin (NEURONTIN) 100 MG capsule Take 1 capsule (100 mg total) by mouth daily.  90 capsule  3  . glipiZIDE (GLUCOTROL XL) 10 MG 24 hr tablet Take one in the morning and 2 in the evening for diabetes  45 tablet  4  . glucose blood test strip 1 each by Other route as needed. Use as instructed      . ibuprofen (ADVIL,MOTRIN) 200 MG tablet Take 400 mg by mouth every 4 (four) hours as needed. For pain      . lisinopril (PRINIVIL,ZESTRIL) 10 MG tablet Take 1 tablet (10 mg total) by mouth daily.  90 tablet  3  . metFORMIN (GLUCOPHAGE) 1000 MG tablet Take 1 tablet (1,000 mg total) by mouth daily with breakfast.  60 tablet  12  . ondansetron  (ZOFRAN) 4 MG tablet Take 1 tablet (4 mg total) by mouth every 6 (six) hours.  12 tablet  0  . ondansetron (ZOFRAN-ODT) 8 MG disintegrating tablet Take 1 tablet (8 mg total) by mouth every 8 (eight) hours as needed for nausea.  30 tablet  2  . sertraline (ZOLOFT) 100 MG tablet Take one half pill in a.m. and one pill at nighttime for depression  45 tablet  4   No current facility-administered medications for this visit.    Allergies  Allergen Reactions  . Morphine And Related Dermatitis and Other (See Comments)    Severe itching leading to welts and open wounds.    Family History  Problem Relation Age of Onset  . Anxiety disorder Mother   . Hyperlipidemia Mother   . Arthritis Father   . COPD Father   . Heart disease Father   . Heart disease Maternal Grandfather   . Congestive Heart Failure Maternal Grandmother   . Heart disease Maternal Grandmother   . Heart disease Paternal Grandfather     History   Social History  .  Marital Status: Married    Spouse Name: N/A    Number of Children: N/A  . Years of Education: N/A   Occupational History  . Not on file.   Social History Main Topics  . Smoking status: Current Every Day Smoker -- 0.50 packs/day for 20 years    Types: Cigarettes  . Smokeless tobacco: Not on file  . Alcohol Use: No  . Drug Use: No  . Sexual Activity: Yes    Birth Control/ Protection: Other-see comments     Comment: hysterectomy   Other Topics Concern  . 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  Respiratory: Denies difficulty breathing, shortness of breath, cough or sputum production.   Cardiovascular: Denies chest pain, chest tightness, palpitations or swelling in the hands or feet.  Gastrointestinal: Denies abdominal pain, bloating, constipation, diarrhea or blood in the stool.  Skin: Denies  redness, rashes, lesions or ulcercations.  Neurological: Pt reports numbness and tingling in feet. Denies dizziness, difficulty with memory, difficulty with speech or problems with balance and coordination.   No other specific complaints in a complete review of systems (except as listed in HPI above).  Objective:   Physical Exam    BP 138/90  Pulse 124  Temp(Src) 98.1 F (36.7 C) (Oral)  Wt 216 lb (97.977 kg)  BMI 37.38 kg/m2  SpO2 98% Wt Readings from Last 3 Encounters:  04/27/13 216 lb (97.977 kg)  04/06/13 218 lb 4 oz (98.998 kg)  01/06/13 207 lb 6.4 oz (94.076 kg)    General: Appears her stated age, obese but well developed, well nourished in NAD. Skin: Warm, dry and intact. No rashes, lesions or ulcerations noted. Cardiovascular: Normal rate and rhythm. S1,S2 noted.  No murmur, rubs or gallops noted. No JVD or BLE edema. No carotid bruits noted. Pulmonary/Chest: Normal effort and positive vesicular breath sounds. No respiratory distress. No wheezes, rales or ronchi noted.  Abdomen: Soft and nontender. Normal bowel sounds, no bruits noted. No distention or masses noted. Liver, spleen and kidneys non palpable. Neurological: Alert and oriented. Cranial nerves II-XII intact. Coordination normal. +DTRs bilaterally.   BMET    Component Value Date/Time   NA 134* 04/06/2013 0917   K 3.8 04/06/2013 0917   CL 101 04/06/2013 0917   CO2 25 04/06/2013 0917   GLUCOSE 239* 04/06/2013 0917   BUN 5* 04/06/2013 0917   CREATININE 0.6 04/06/2013 0917   CREATININE 0.74 01/06/2013 0952   CALCIUM 8.7 04/06/2013 0917   GFRNONAA >90 05/14/2012 1050   GFRAA >90 05/14/2012 1050    Lipid Panel     Component Value Date/Time   CHOL 179 04/06/2013 0917   TRIG 149.0 04/06/2013 0917   HDL 42.50 04/06/2013 0917   CHOLHDL 4 04/06/2013 0917   VLDL 29.8 04/06/2013 0917   LDLCALC 107* 04/06/2013 0917    CBC    Component Value Date/Time   WBC 7.6 04/06/2013 0917   WBC 8.0 05/26/2012 0904   RBC 4.22  04/06/2013 0917   RBC 4.10 05/26/2012 0904   HGB 11.7* 04/06/2013 0917   HGB 11.3* 05/26/2012 0904   HCT 35.0* 04/06/2013 0917   HCT 36.7* 05/26/2012 0904   PLT 212.0 04/06/2013 0917   MCV 83.0 04/06/2013 0917   MCV 89.5 05/26/2012 0904   MCH 27.6 05/26/2012 0904   MCH  28.9 05/14/2012 1050   MCHC 33.3 04/06/2013 0917   MCHC 30.8* 05/26/2012 0904   RDW 14.0 04/06/2013 0917   LYMPHSABS 2.3 05/14/2012 1050   MONOABS 0.4 05/14/2012 1050   EOSABS 0.2 05/14/2012 1050   BASOSABS 0.0 05/14/2012 1050    Hgb A1C Lab Results  Component Value Date   HGBA1C 9.8* 04/06/2013       Assessment & Plan:

## 2013-05-08 ENCOUNTER — Encounter: Payer: Self-pay | Admitting: Internal Medicine

## 2013-05-21 IMAGING — CT CT ABD-PELV W/ CM
1 of 3 series · 14 of 32 positions shown, 19 images · IV contrast (OMNIPAQUE 300)
Comparison: 04/22/2012

CLINICAL DATA: Emesis.  Nausea and vomiting

CT ABDOMEN AND PELVIS WITH CONTRAST
TECHNIQUE: Multidetector CT imaging of the abdomen and pelvis was
performed following the standard protocol during bolus
administration of intravenous contrast.
Contrast: 100mL OMNIPAQUE IOHEXOL 300 MG/ML  SOLN

[Series 2: abd/pel with · axial · 0.79mm/px · z∈[-446,-46]mm · 14 of 91 slices shown, 19 images]
[im 6/91  soft-tissue]
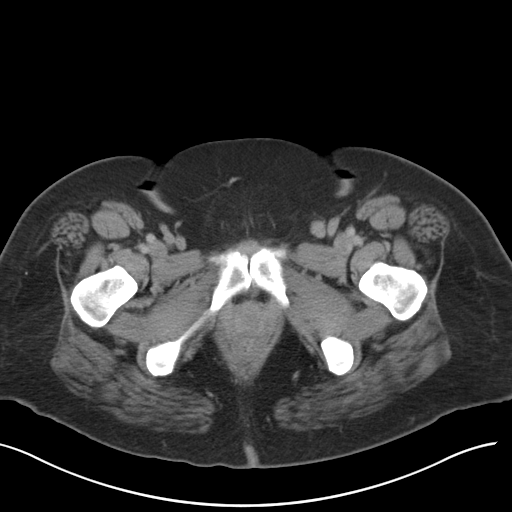
[im 6/91  bone]
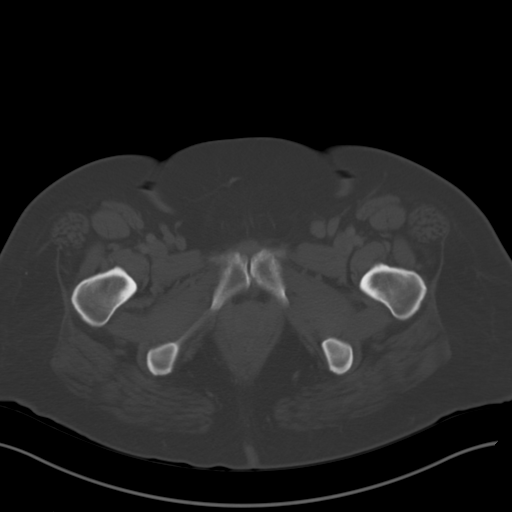
[im 11/91  soft-tissue]
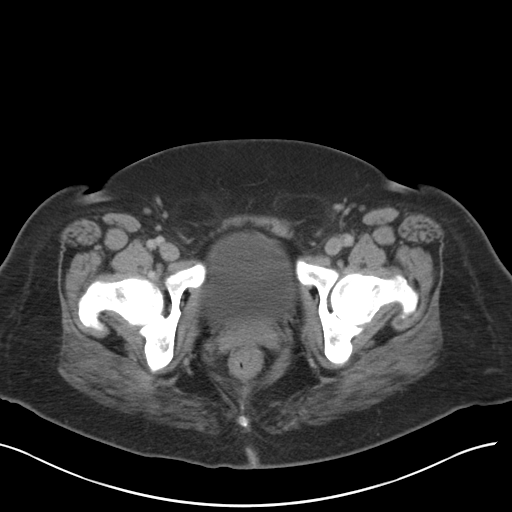
[im 21/91  soft-tissue]
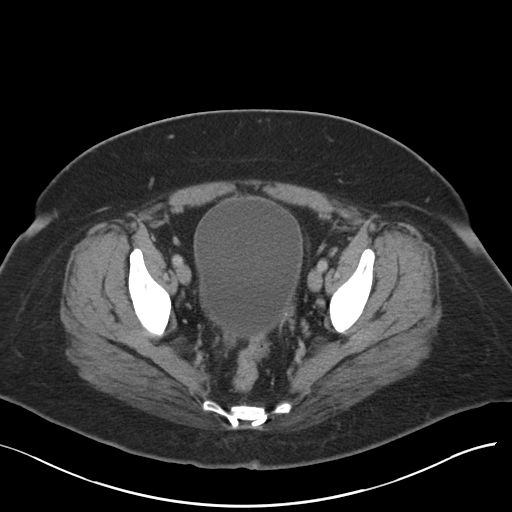
[im 26/91  soft-tissue]
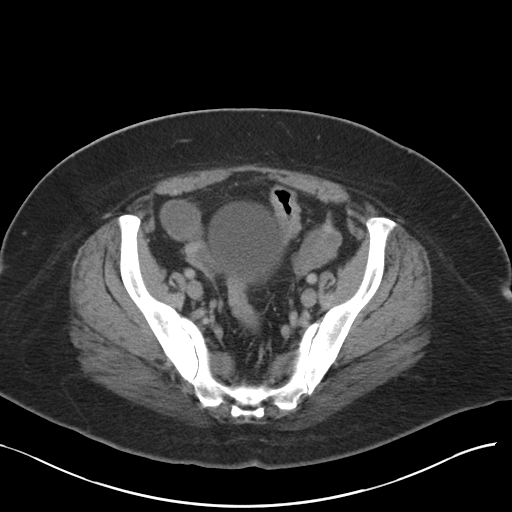
[im 31/91  soft-tissue]
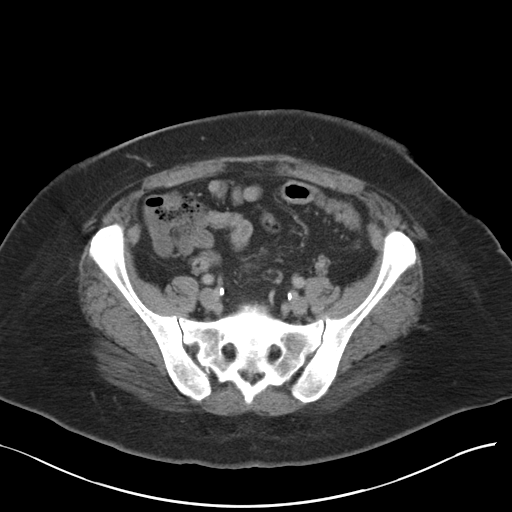
[im 41/91  soft-tissue]
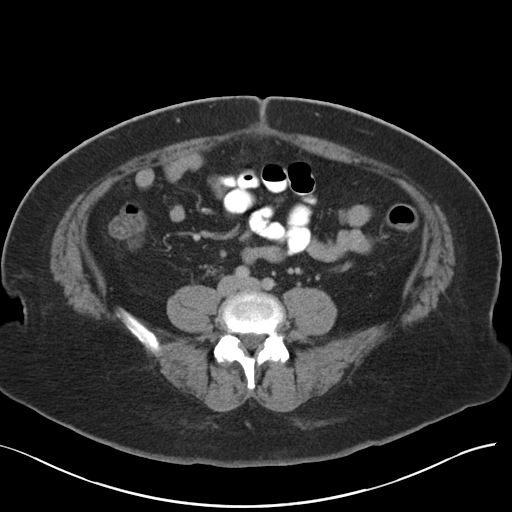
[im 46/91  soft-tissue]
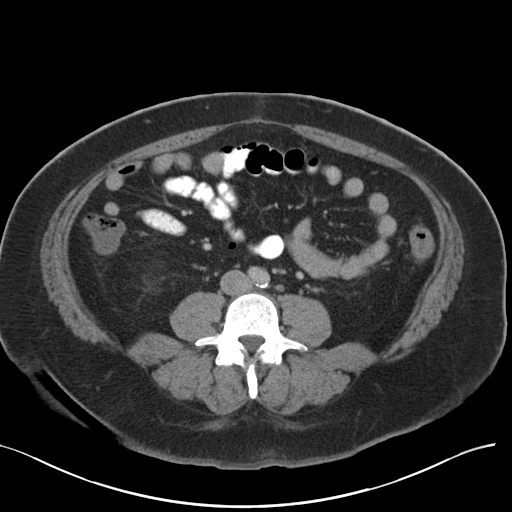
[im 51/91  soft-tissue]
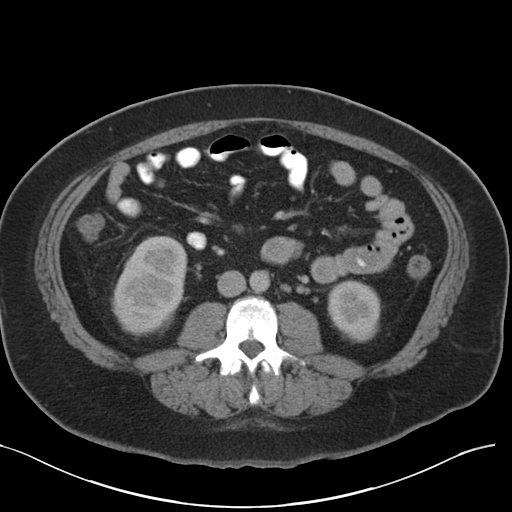
[im 61/91  soft-tissue]
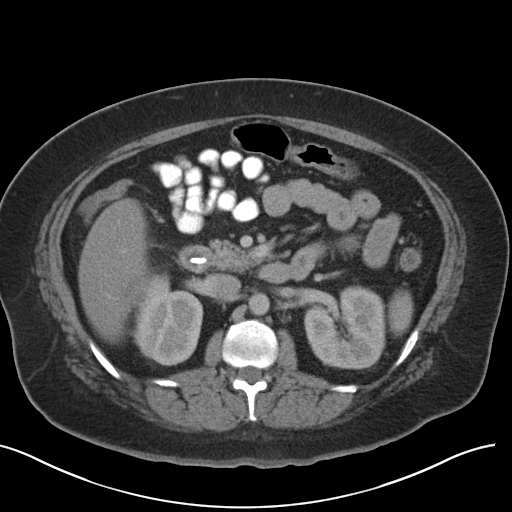
[im 61/91  bone]
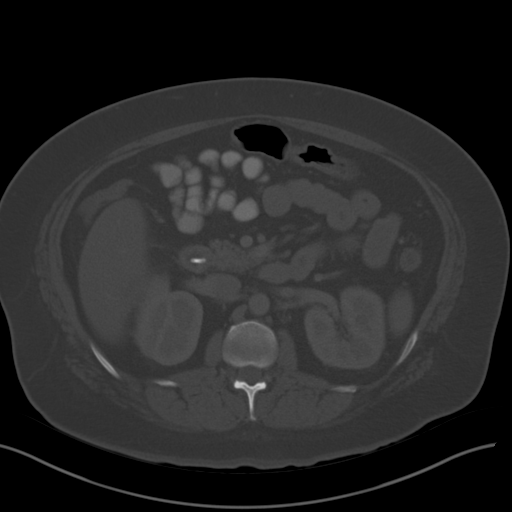
[im 66/91  soft-tissue]
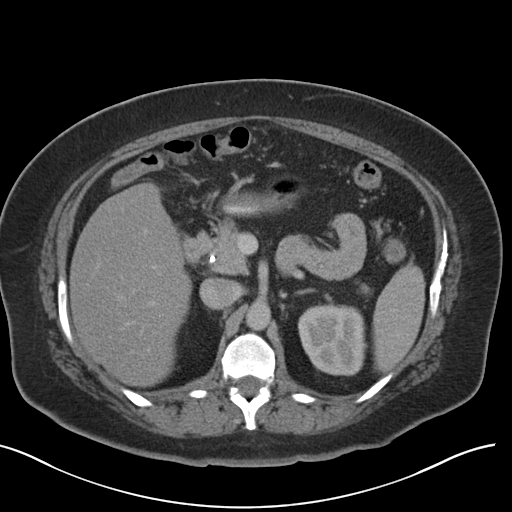
[im 71/91  soft-tissue]
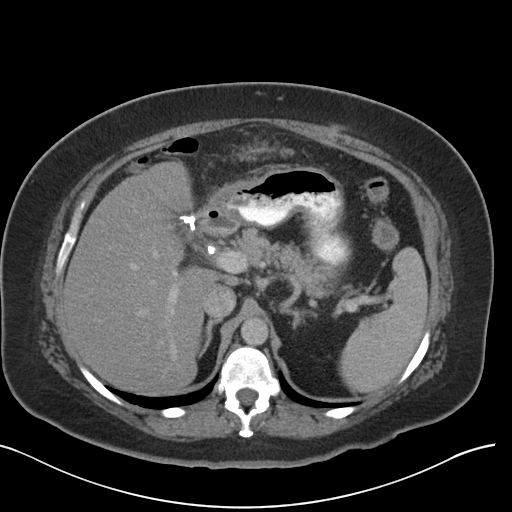
[im 71/91  lung]
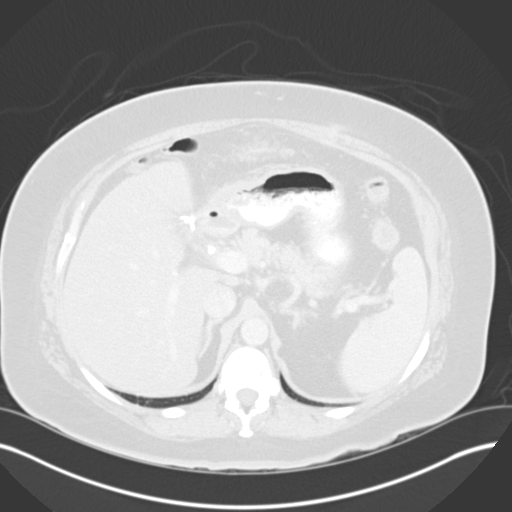
[im 76/91  lung]
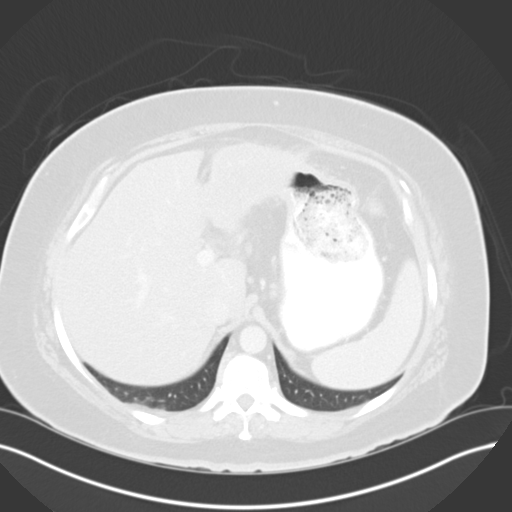
[im 81/91  soft-tissue]
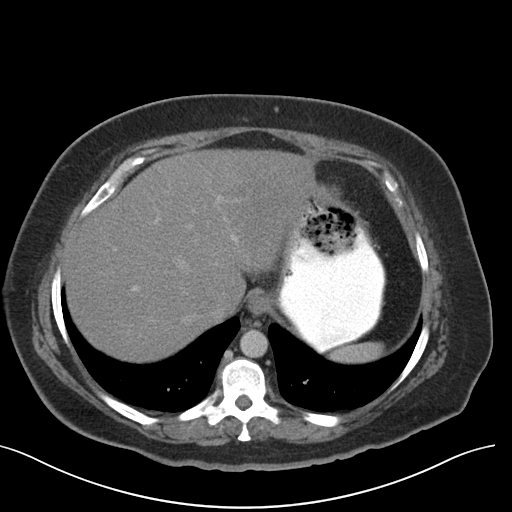
[im 81/91  lung]
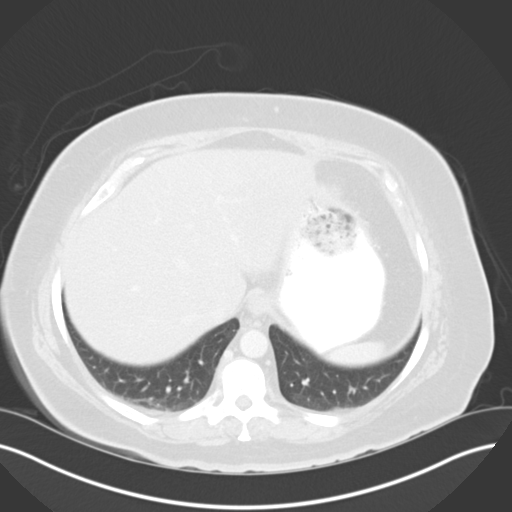
[im 86/91  soft-tissue]
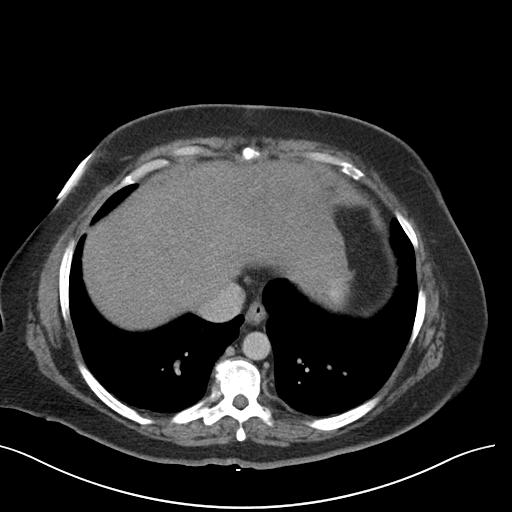
[im 86/91  lung]
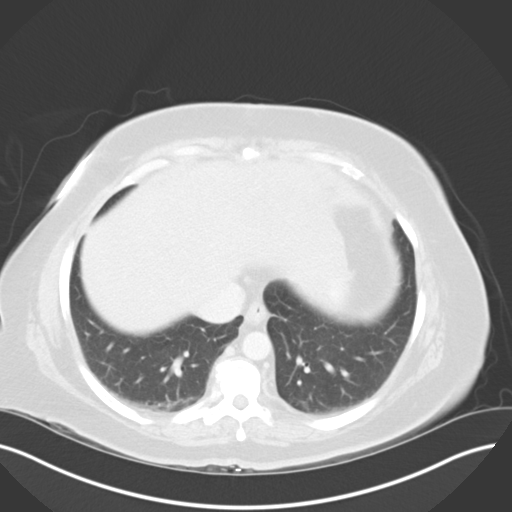

[14 of 32 positions shown; findings below may reference images not displayed]

FINDINGS: Mild dependent changes noted in the lung bases.  No
pericardial or pleural effusion.  Diffuse fatty infiltration of the
liver parenchyma identified.  The previously described large sub
hepatic fluid collection has completely resolved. Residual fat
stranding within the omental fat is identified, image 20.  Previous
cholecystectomy.  There is a stent identified within the common
bile duct with decompression of the intrahepatic bile ducts.  The
pancreas has a normal appearance normal appearance of the spleen.

Both adrenal glands appear normal.  The right kidney is
unremarkable.  The left kidney is normal.Urinary bladder is
unremarkable.  Previous hysterectomy.

The stomach appears within normal limits.  The small bowel loops
are within normal limits.  Normal appearance of the colon.  The
appendix is visualized and appears normal.

Review of the visualized bony structures demonstrates no acute
findings.  No aggressive lytic or sclerotic bone lesions.
IMPRESSION: 1. Interval resolution of previous large upper abdominal fluid
collection
2.  No new findings.
3.  Status post common bile duct stenting.

## 2013-07-27 ENCOUNTER — Encounter: Payer: Self-pay | Admitting: Internal Medicine

## 2013-07-27 DIAGNOSIS — IMO0001 Reserved for inherently not codable concepts without codable children: Secondary | ICD-10-CM

## 2013-07-27 DIAGNOSIS — E1165 Type 2 diabetes mellitus with hyperglycemia: Principal | ICD-10-CM

## 2013-07-30 ENCOUNTER — Ambulatory Visit: Payer: Managed Care, Other (non HMO) | Admitting: Internal Medicine

## 2013-07-30 MED ORDER — GLIPIZIDE ER 10 MG PO TB24: ORAL_TABLET | ORAL | Status: DC

## 2013-08-08 ENCOUNTER — Ambulatory Visit: Payer: Managed Care, Other (non HMO) | Admitting: Physician Assistant

## 2013-08-10 ENCOUNTER — Encounter: Payer: Self-pay | Admitting: Physician Assistant

## 2013-08-10 ENCOUNTER — Encounter: Payer: Self-pay | Admitting: Gastroenterology

## 2013-08-10 ENCOUNTER — Telehealth: Payer: Self-pay | Admitting: *Deleted

## 2013-08-10 ENCOUNTER — Ambulatory Visit (INDEPENDENT_AMBULATORY_CARE_PROVIDER_SITE_OTHER): Payer: Managed Care, Other (non HMO) | Admitting: Physician Assistant

## 2013-08-10 ENCOUNTER — Other Ambulatory Visit (INDEPENDENT_AMBULATORY_CARE_PROVIDER_SITE_OTHER): Payer: Managed Care, Other (non HMO)

## 2013-08-10 VITALS — BP 130/88 | HR 116 | Temp 98.1°F | Wt 209.1 lb

## 2013-08-10 DIAGNOSIS — E119 Type 2 diabetes mellitus without complications: Secondary | ICD-10-CM

## 2013-08-10 DIAGNOSIS — E1165 Type 2 diabetes mellitus with hyperglycemia: Secondary | ICD-10-CM

## 2013-08-10 DIAGNOSIS — K6289 Other specified diseases of anus and rectum: Secondary | ICD-10-CM

## 2013-08-10 DIAGNOSIS — IMO0002 Reserved for concepts with insufficient information to code with codable children: Secondary | ICD-10-CM

## 2013-08-10 DIAGNOSIS — IMO0001 Reserved for inherently not codable concepts without codable children: Secondary | ICD-10-CM

## 2013-08-10 DIAGNOSIS — E1142 Type 2 diabetes mellitus with diabetic polyneuropathy: Secondary | ICD-10-CM

## 2013-08-10 DIAGNOSIS — E1140 Type 2 diabetes mellitus with diabetic neuropathy, unspecified: Secondary | ICD-10-CM

## 2013-08-10 DIAGNOSIS — E1149 Type 2 diabetes mellitus with other diabetic neurological complication: Secondary | ICD-10-CM

## 2013-08-10 LAB — HEMOGLOBIN A1C: Hgb A1c MFr Bld: 9.6 % — ABNORMAL HIGH (ref 4.6–6.5)

## 2013-08-10 NOTE — Patient Instructions (Signed)
It was nice to meet you today Meghan Park!  I have order a lab for you, you do not need to be fasting.   Continue with all medications as directed. If you find you are having to increase your use of Zofran call and I can send for refill.   Rectal Bleeding  Rectal bleeding is when blood comes out of the opening of the butt (anus). Rectal bleeding may show up as bright red blood or really dark poop (stool). The poop may look dark red, maroon, or black. Rectal bleeding is often a sign that something is wrong. This needs to be checked by a doctor.  HOME CARE  Eat a diet high in fiber. This will help keep your poop soft.  Limit activitiy.  Drink enough fluids to keep your pee (urine) clear or pale yellow.  Take a warm bath to soothe any pain.  Follow up with your doctor as told. GET HELP RIGHT AWAY IF:  You have more bleeding.  You have black or dark red poop.  You throw up (vomit) blood or it looks like coffee grounds.  You have belly (abdominal) pain or tenderness.  You have a fever.  You feel weak, sick to your stomach (nauseous), or you pass out (faint).  You have pain that is so bad you cannot poop (bowel movement). MAKE SURE YOU:  Understand these instructions.  Will watch your condition.  Will get help right away if you are not doing well or get worse. Document Released: 03/24/2011 Document Revised: 10/04/2011 Document Reviewed: 03/24/2011 [facility] Patient Information 2014 [place], [place]. Diets for Diabetes, Food Labeling Look at food labels to help you decide how much of a product you can eat. You will want to check the amount of total carbohydrate in a serving to see how the food fits into your meal plan. In the list of ingredients, the ingredient present in the largest amount by weight must be listed first, followed by the other ingredients in descending order. STANDARD OF IDENTITY Most products have a list of ingredients. However, foods that the Food and Drug  Administration (FDA) has given a standard of identity do not need a list of ingredients. A standard of identity means that a food must contain certain ingredients if it is called a particular name. Examples are mayonnaise, peanut butter, ketchup, jelly, and cheese. LABELING TERMS There are many terms found on food labels. Some of these terms have specific definitions. Some terms are regulated by the FDA, and the FDA has clearly specified how they can be used. Others are not regulated or well-defined and can be misleading and confusing. SPECIFICALLY DEFINED TERMS Nutritive Sweetener.  A sweetener that contains calories,such as table sugar or honey. Nonnutritive Sweetener.  A sweetener with few or no calories,such as saccharin, aspartame, sucralose, and cyclamate. LABELING TERMS REGULATED BY THE FDA Free.  The product contains only a tiny or small amount of fat, cholesterol, sodium, sugar, or calories. For example, a "fat-free" product will contain less than 0.5 g of fat per serving. Low.  A food described as "low" in fat, saturated fat, cholesterol, sodium, or calories could be eaten fairly often without exceeding dietary guidelines. For example, "low in fat" means no more than 3 g of fat per serving. Lean.  "Lean" and "extra lean" are U.S. Department of Agriculture Architect) terms for use on meat and poultry products. "Lean" means the product contains less than 10 g of fat, 4 g of saturated fat, and 95 mg of  cholesterol per serving. "Lean" is not as low in fat as a product labeled "low." Extra Lean.  "Extra lean" means the product contains less than 5 g of fat, 2 g of saturated fat, and 95 mg of cholesterol per serving. While "extra lean" has less fat than "lean," it is still higher in fat than a product labeled "low." Reduced, Less, Fewer.  A diet product that contains 25% less of a nutrient or calories than the regular version. For example, hot dogs might be labeled "25% less fat than our  regular hot dogs." Light/Lite.  A diet product that contains  fewer calories or  the fat of the original. For example, "light in sodium" means a product with  the usual sodium. More.  One serving contains at least 10% more of the daily value of a vitamin, mineral, or fiber than usual. Good Source Of.  One serving contains 10% to 19% of the daily value for a particular vitamin, mineral, or fiber. Excellent Source Of.  One serving contains 20% or more of the daily value for a particular nutrient. Other terms used might be "high in" or "rich in." Enriched or Fortified.  The product contains added vitamins, minerals, or protein. Nutrition labeling must be used on enriched or fortified foods. Imitation.  The product has been altered so that it is lower in protein, vitamins, or minerals than the usual food,such as imitation peanut butter. Total Fat.  The number listed is the total of all fat found in a serving of the product. Under total fat, food labels must list saturated fat and trans fat, which are associated with raising bad cholesterol and an increased risk of heart blood vessel disease. Saturated Fat.  Mainly fats from animal-based sources. Some examples are red meat, cheese, cream, whole milk, and coconut oil. Trans Fat.  Found in some fried snack foods, packaged foods, and fried restaurant foods. It is recommended you eat as close to 0 g of trans fat as possible, since it raises bad cholesterol and lowers good cholesterol. Polyunsaturated and Monounsaturated Fats.  More healthful fats. These fats are from plant sources. Total Carbohydrate.  The number of carbohydrate grams in a serving of the product. Under total carbohydrate are listed the other carbohydrate sources, such as dietary fiber and sugars. Dietary Fiber.  A carbohydrate from plant sources. Sugars.  Sugars listed on the label contain all naturally occurring sugars as well as added sugars. LABELING TERMS NOT  REGULATED BY THE FDA Sugarless.  Table sugar (sucrose) has not been added. However, the manufacturer may use another form of sugar in place of sucrose to sweeten the product. For example, sugar alcohols are used to sweeten foods. Sugar alcohols are a form of sugar but are not table sugar. If a product contains sugar alcohols in place of sucrose, it can still be labeled "sugarless." Low Salt, Salt-Free, Unsalted, No Salt, No Salt Added, Without Added Salt.  Food that is usually processed with salt has been made without salt. However, the food may contain sodium-containing additives, such as preservatives, leavening agents, or flavorings. Natural.  This term has no legal meaning. Organic.  Foods that are certified as organic have been inspected and approved by the USDA to ensure they are produced without pesticides, fertilizers containing synthetic ingredients, bioengineering, or ionizing radiation. Document Released: 07/15/2003 Document Revised: 10/04/2011 Document Reviewed: 01/30/2009 [facility] Patient Information 2014 [place]. Diabetes and Exercise Exercising regularly is important. It is not just about losing weight. It has many health  benefits, such as:  Improving your overall fitness, flexibility, and endurance.  Increasing your bone density.  Helping with weight control.  Decreasing your body fat.  Increasing your muscle strength.  Reducing stress and tension.  Improving your overall health. People with diabetes who exercise gain additional benefits because exercise:  Reduces appetite.  Improves the body's use of blood sugar (glucose).  Helps lower or control blood glucose.  Decreases blood pressure.  Helps control blood lipids (such as cholesterol and triglycerides).  Improves the body's use of the hormone insulin by:  Increasing the body's insulin sensitivity.  Reducing the body's insulin needs.  Decreases the risk for heart disease because  exercising:  Lowers cholesterol and triglycerides levels.  Increases the levels of good cholesterol (such as high-density lipoproteins [HDL]) in the body.  Lowers blood glucose levels. YOUR ACTIVITY PLAN  Choose an activity that you enjoy and set realistic goals. Your health care provider or diabetes educator can help you make an activity plan that works for you. You can break activities into 2 or 3 sessions throughout the day. Doing so is as good as one long session. Exercise ideas include:  Taking the dog for a walk.  Taking the stairs instead of the elevator.  Dancing to your favorite song.  Doing your favorite exercise with a friend. RECOMMENDATIONS FOR EXERCISING WITH TYPE 1 OR TYPE 2 DIABETES   Check your blood glucose before exercising. If blood glucose levels are greater than 240 mg/dL, check for urine ketones. Do not exercise if ketones are present.  Avoid injecting insulin into areas of the body that are going to be exercised. For example, avoid injecting insulin into:  The arms when playing tennis.  The legs when jogging.  Keep a record of:  Food intake before and after you exercise.  Expected peak times of insulin action.  Blood glucose levels before and after you exercise.  The type and amount of exercise you have done.  Review your records with your health care provider. Your health care provider will help you to develop guidelines for adjusting food intake and insulin amounts before and after exercising.  If you take insulin or oral hypoglycemic agents, watch for signs and symptoms of hypoglycemia. They include:  Dizziness.  Shaking.  Sweating.  Chills.  Confusion.  Drink plenty of water while you exercise to prevent dehydration or heat stroke. Body water is lost during exercise and must be replaced.  Talk to your health care provider before starting an exercise program to make sure it is safe for you. Remember, almost any type of activity is better  than none. Document Released: 10/02/2003 Document Revised: 03/14/2013 Document Reviewed: 12/19/2012 [facility] Patient Information 2014 [place], [place].

## 2013-08-10 NOTE — Progress Notes (Signed)
Subjective:    Patient ID: Meghan Park, female    DOB: 10-Apr-1971, 43 y.o.   MRN: [id number]  HPI Comments: Patient is a 43 year old female who presents to the office for a follow up for her diabetes. Reports she was seen at this office three months PTA and had an elevated A1c. Reports she has been trying through diet, exercise and medications to improve her numbers stating she does not want to go on insulin. Reports she has been eating much better by increasing her vegetables and exercising more frequently. Patient also complains of intermittent rectal pain over the last two weeks. States she is experiencing sharp, glass like, pains around her anus when having bowel movements. States she does have irritable bowel however hers is mostly diarrhea.  Patient reports her bowel movement have been of a soft consistency with a normal color. Reports she has seen blood on the tissue paper after wiping however states it has just been a little drop and is not with every bowel movement. Denies headaches, vision changes or disturbances, abdominal pain, cramping or bloating.    Review of Systems  Eyes: Negative for pain and visual disturbance.  Gastrointestinal: Positive for anal bleeding (minimal). Negative for abdominal pain, constipation and abdominal distention.  Neurological: Negative for dizziness, weakness, numbness and headaches.  All other systems reviewed and are negative.      Objective:   Physical Exam  Vitals reviewed. Constitutional: She is oriented to person, place, and time. She appears well-developed and well-nourished.  HENT:  Head: Normocephalic and atraumatic.  Eyes: Conjunctivae are normal. Pupils are equal, round, and reactive to light.  Neck: Normal range of motion.  Cardiovascular: Normal rate, regular rhythm and normal heart sounds.   Pulmonary/Chest: Effort normal and breath sounds normal. She has no wheezes. She has no rhonchi. She has no rales.  Abdominal: Soft. Normal  appearance and bowel sounds are normal. There is no tenderness. There is no rebound.  Genitourinary: Rectal exam shows tenderness. Rectal exam shows no external hemorrhoid, no internal hemorrhoid, no fissure, no mass and anal tone normal.  No fecal matter obtained with DRE, could not do Guaiac.  No scarring noted  Musculoskeletal: Normal range of motion.  Diabetic foot exam completed.  Neurological: She is alert and oriented to person, place, and time.  Skin: Skin is warm and dry.  Psychiatric: She has a normal mood and affect.    Past Medical History  Diagnosis Date  . DM (diabetes mellitus), type 2, uncontrolled   . Anxiety associated with depression   . Hip pain, right   . Depression   . Anxiety   . Pneumonia hx  . Cystitis   . Cholelithiasis   . Fatty liver   . Arthritis hip, right  . Insomnia   . Hypertension   . Nausea    Lab Results  Component Value Date   WBC 7.6 04/06/2013   HGB 11.7* 04/06/2013   HCT 35.0* 04/06/2013   PLT 212.0 04/06/2013   GLUCOSE 239* 04/06/2013   CHOL 179 04/06/2013   TRIG 149.0 04/06/2013   HDL 42.50 04/06/2013   LDLCALC 107* 04/06/2013   ALT 20 04/06/2013   AST 31 04/06/2013   NA 134* 04/06/2013   K 3.8 04/06/2013   CL 101 04/06/2013   CREATININE 0.6 04/06/2013   BUN 5* 04/06/2013   CO2 25 04/06/2013   INR 1.18 04/22/2012   HGBA1C 9.8* 04/06/2013   MICROALBUR 0.6 04/06/2013   Lab Results  Component Value Date   HGBA1C 9.8* 04/06/2013        Assessment & Plan:   Refer to problem list for A&P of chronic conditions.   Rectal pain Discussed with patient could possibly have small fissure to noticeable to visual exam. Explained fissures can take time to heal. Watch and wait approach see if symptoms improve.  Instructed to keep high fiber diet, plenty of water.

## 2013-08-10 NOTE — Telephone Encounter (Signed)
Notified pt with labs results & your recommendation. Pt agreed to take the Januvia. Pls send to [facility]....Meghan Park/lmb

## 2013-08-10 NOTE — Progress Notes (Signed)
Pre-visit discussion using our clinic review tool. No additional management support is needed unless otherwise documented below in the visit note.  

## 2013-08-11 NOTE — Assessment & Plan Note (Signed)
Discussed with patient another oral medication could be added on to current regimen. Will update lab A1C and evaluate.  Results returned from lab prior to completion of patient note.  Rx for Januvia 100 mg tab daily.  Patient phoned by MA, Rx e-scribed to preferred pharmacy.

## 2013-08-11 NOTE — Assessment & Plan Note (Signed)
Discussed with patient, recently started Gabapentin with good relief of pain. Pain located mostly in top of left foot per patient.

## 2013-08-13 MED ORDER — SITAGLIPTIN PHOSPHATE 100 MG PO TABS: 100.0000 mg | ORAL_TABLET | Freq: Every day | ORAL | Status: DC

## 2013-08-13 NOTE — Telephone Encounter (Signed)
Pt was made aware on Friday med would be sent in...Raechel Chute/lmb

## 2013-08-13 NOTE — Telephone Encounter (Signed)
Ok  erx done - let pt know same

## 2013-08-15 ENCOUNTER — Telehealth: Payer: Self-pay | Admitting: *Deleted

## 2013-08-15 NOTE — Telephone Encounter (Signed)
Patient called and stated that she was seen in the office on 08/10/2013 for rectal bleeding. Patient states that she called [facility] and was given an appointment for 08/16/2013 with Dr. Jarold MottoPatterson. Patient states that she received a call stating that Dr.patterson is no longer seeing new patients and that her appointment had to be rescheduled to 08/29/2013. Patient states that she was told that if she get a referral placed then she may get a quicker appointment. Patient would like to know if she could a get a referral to get a quicker appointment. Patient states that she is still having some bleeding and would like to get this taking care of as soon as possible. Please advise.

## 2013-08-16 ENCOUNTER — Encounter: Payer: Self-pay | Admitting: Gastroenterology

## 2013-08-16 ENCOUNTER — Ambulatory Visit: Payer: Managed Care, Other (non HMO) | Admitting: Gastroenterology

## 2013-08-16 NOTE — Telephone Encounter (Signed)
[facility] patient is aware.

## 2013-08-20 ENCOUNTER — Encounter: Payer: Self-pay | Admitting: Gastroenterology

## 2013-08-20 ENCOUNTER — Ambulatory Visit (INDEPENDENT_AMBULATORY_CARE_PROVIDER_SITE_OTHER): Payer: Managed Care, Other (non HMO) | Admitting: Gastroenterology

## 2013-08-20 ENCOUNTER — Telehealth: Payer: Self-pay | Admitting: Physician Assistant

## 2013-08-20 VITALS — BP 110/60 | HR 108 | Ht 63.0 in | Wt 210.0 lb

## 2013-08-20 DIAGNOSIS — K602 Anal fissure, unspecified: Secondary | ICD-10-CM

## 2013-08-20 DIAGNOSIS — R110 Nausea: Secondary | ICD-10-CM

## 2013-08-20 DIAGNOSIS — R197 Diarrhea, unspecified: Secondary | ICD-10-CM

## 2013-08-20 NOTE — Assessment & Plan Note (Signed)
Rule out gastroparesis  Recommendations #1 gastric emptying scan 

## 2013-08-20 NOTE — Assessment & Plan Note (Signed)
Diarrhea is chronic and could be do to IBS.  Actual overgrowth, microscopic colitis are other considerations.  Recommendations #1 check gastric emptying scan; if abnormal I would consider empiric therapy for bacterial overgrowth #2 to consider colonoscopy pending results of #1

## 2013-08-20 NOTE — Assessment & Plan Note (Addendum)
Rectal pain and minimal bleeding are 2 to a symptomatic fissure.    Recommendations #1 fiber supplementation daily #2 nitroglycerin ointment 0.125% 3 times a day

## 2013-08-20 NOTE — Progress Notes (Signed)
_                                                                                                                History of Present Illness: 42 year old white female with history of diabetes, fatty liver, referred for evaluation of rectal pain and bleeding.  Approximately 2 weeks ago she developed moderately severe burning pain when moving her bowels.  Since that time she has pain with every bowel movement.  She's also seen a minimal amount of blood on the toilet tissue.  She has 3-4 very loose stools daily.  This is her baseline.  She also complains of chronic nausea and bloating.  She denies pyrosis.  In 2013 patient underwent a laparoscopic cholecystectomy complicated by a bile duct leak that was treated with endoscopic stent placement.    Past Medical History  Diagnosis Date  . DM (diabetes mellitus), type 2, uncontrolled   . Anxiety associated with depression   . Hip pain, right   . Depression   . Anxiety   . Pneumonia hx  . Cystitis   . Cholelithiasis   . Fatty liver   . Arthritis hip, right  . Insomnia   . Hypertension   . Nausea   . Anal fissure   . Chronic headaches   . HLD (hyperlipidemia)   . Pneumonia    Past Surgical History  Procedure Laterality Date  . Cesarean section      x2  . Abdominal hysterectomy  2012  . Cholecystectomy  04/13/12  . Ercp  04/24/2012    Procedure: ENDOSCOPIC RETROGRADE CHOLANGIOPANCREATOGRAPHY (ERCP);  Surgeon: Theda Belfast, MD;  Location: [facility];  Service: Endoscopy;  Laterality: N/A;  . Tubal ligation    . Ercp  06/02/2012    Procedure: ENDOSCOPIC RETROGRADE CHOLANGIOPANCREATOGRAPHY (ERCP);  Surgeon: Theda Belfast, MD;  Location: [facility];  Service: Endoscopy;  Laterality: N/A;   family history includes Anxiety disorder in her mother; Arthritis in her father; Breast cancer in her paternal grandmother; COPD in her father; Congestive Heart Failure in her maternal grandmother; Diabetes in her maternal aunt;  Heart disease in her father, maternal grandfather, maternal grandmother, and paternal grandfather; Hyperlipidemia in her mother; Irritable bowel syndrome in her mother. Current Outpatient Prescriptions  Medication Sig Dispense Refill  . clonazePAM (KLONOPIN) 1 MG tablet TAKE 1/2 TO 1 TABLET BY MOUTH DAILY AS NEEDED FOR ANXIETY  30 tablet  2  . cyclobenzaprine (FLEXERIL) 10 MG tablet Take 1 tablet (10 mg total) by mouth at bedtime as needed for muscle spasms.  30 tablet  4  . dicyclomine (BENTYL) 20 MG tablet Take 1 tablet (20 mg total) by mouth daily.  60 tablet  4  . gabapentin (NEURONTIN) 100 MG capsule Take 1 capsule (100 mg total) by mouth daily.  90 capsule  3  . glipiZIDE (GLUCOTROL XL) 10 MG 24 hr tablet Take one in the morning and 2 in the evening for diabetes  45 tablet  4  . glucose blood test strip  1 each by Other route as needed. Use as instructed      . ibuprofen (ADVIL,MOTRIN) 200 MG tablet Take 400 mg by mouth every 4 (four) hours as needed. For pain      . lisinopril (PRINIVIL,ZESTRIL) 10 MG tablet Take 1 tablet (10 mg total) by mouth daily.  90 tablet  3  . metFORMIN (GLUCOPHAGE) 1000 MG tablet Take 1 tablet (1,000 mg total) by mouth daily with breakfast.  60 tablet  12  . ondansetron (ZOFRAN-ODT) 8 MG disintegrating tablet Take 1 tablet (8 mg total) by mouth every 8 (eight) hours as needed for nausea.  30 tablet  2  . sertraline (ZOLOFT) 100 MG tablet Take one half pill in a.m. and one pill at nighttime for depression  45 tablet  4  . sitaGLIPtin (JANUVIA) 100 MG tablet Take 1 tablet (100 mg total) by mouth daily.  30 tablet  2   No current facility-administered medications for this visit.   Allergies as of 08/20/2013 - Review Complete 08/20/2013  Allergen Reaction Noted  . Morphine and related Dermatitis and Other (See Comments) 06/02/2012    reports that she has been smoking Cigarettes.  She has a 10 pack-year smoking history. She has never used smokeless tobacco. She  reports that she does not drink alcohol or use illicit drugs.     Review of Systems: Pertinent positive and negative review of systems were noted in the above HPI section. All other review of systems were otherwise negative.  Vital signs were reviewed in today's medical record Physical Exam: General: Well developed , well nourished, no acute distress Skin: anicteric Head: Normocephalic and atraumatic Eyes:  sclerae anicteric, EOMI Ears: Normal auditory acuity Mouth: No deformity or lesions Neck: Supple, no masses or thyromegaly Lungs: Clear throughout to auscultation Heart: Regular rate and rhythm; no murmurs, rubs or bruits Abdomen: Soft, non tender and non distended. No masses, hepatosplenomegaly or hernias noted. Normal Bowel sounds.  There is no succussion splash Rectal: there is an anal fissure at the posterior midline Musculoskeletal: Symmetrical with no gross deformities  Skin: No lesions on visible extremities Pulses:  Normal pulses noted Extremities: No clubbing, cyanosis, edema or deformities noted Neurological: Alert oriented x 4, grossly nonfocal Cervical Nodes:  No significant cervical adenopathy Inguinal Nodes: No significant inguinal adenopathy Psychological:  Alert and cooperative. Normal mood and affect  See Assessment and Plan under Problem List

## 2013-08-20 NOTE — Telephone Encounter (Signed)
Pt request refill for Zofran 8 mg to be send into [facility]. Please advise.

## 2013-08-20 NOTE — Patient Instructions (Addendum)
You have been given a separate informational sheet regarding your tobacco use, the importance of quitting and local resources to help you quit. Take 2  Imodium every am Use up to 2-3 times daily Use Fiber daily We are sending a prescription to [facility] (Nitroglycerin ointment)   You have been scheduled for a gastric emptying scan at [facility] on 08/30/2013 at 1:00pm Please arrive at least 15 minutes prior to your appointment for registration. Please make certain not to have anything to eat or drink after midnight the night before your test. Hold all stomach medications (ex: Zofran, phenergan, Reglan) 48 hours prior to your test. If you need to reschedule your appointment, please contact radiology scheduling at [phone]. _____________________________________________________________________ A gastric-emptying study measures how long it takes for food to move through your stomach. There are several ways to measure stomach emptying. In the most common test, you eat food that contains a small amount of radioactive material. A scanner that detects the movement of the radioactive material is placed over your abdomen to monitor the rate at which food leaves your stomach. This test normally takes about 2 hours to complete. _____________________________________________________________________

## 2013-08-21 ENCOUNTER — Other Ambulatory Visit: Payer: Self-pay

## 2013-08-21 DIAGNOSIS — R110 Nausea: Secondary | ICD-10-CM

## 2013-08-21 MED ORDER — ONDANSETRON 8 MG PO TBDP: 8.0000 mg | ORAL_TABLET | Freq: Three times a day (TID) | ORAL | Status: DC | PRN

## 2013-08-21 NOTE — Telephone Encounter (Signed)
Pt's Rx was E-Scribed into local Pharm this A.M per her request. C.Alvis Pulcini RMA

## 2013-08-29 ENCOUNTER — Telehealth: Payer: Self-pay | Admitting: Gastroenterology

## 2013-08-29 NOTE — Telephone Encounter (Signed)
Spoke with patient  Precert called her and told her it would be 279$ up front before she could have the GES  I explained to her that hse could reschedule it after her pay day  She is calling radiology to reschedule

## 2013-08-30 ENCOUNTER — Encounter (HOSPITAL_COMMUNITY): Payer: Managed Care, Other (non HMO)

## 2013-09-06 ENCOUNTER — Ambulatory Visit: Payer: Managed Care, Other (non HMO) | Admitting: Gastroenterology

## 2013-09-13 ENCOUNTER — Ambulatory Visit (HOSPITAL_COMMUNITY)
Admission: RE | Admit: 2013-09-13 | Discharge: 2013-09-13 | Disposition: A | Discharge status: Home / Self Care | Payer: Managed Care, Other (non HMO) | Source: Ambulatory Visit | Attending: Gastroenterology | Admitting: Gastroenterology

## 2013-09-13 DIAGNOSIS — R141 Gas pain: Secondary | ICD-10-CM | POA: Insufficient documentation

## 2013-09-13 DIAGNOSIS — R143 Flatulence: Secondary | ICD-10-CM

## 2013-09-13 DIAGNOSIS — R112 Nausea with vomiting, unspecified: Secondary | ICD-10-CM | POA: Insufficient documentation

## 2013-09-13 DIAGNOSIS — R110 Nausea: Secondary | ICD-10-CM

## 2013-09-13 DIAGNOSIS — R142 Eructation: Secondary | ICD-10-CM | POA: Insufficient documentation

## 2013-09-13 MED ORDER — TECHNETIUM TC 99M SULFUR COLLOID
2.1000 | Freq: Once | INTRAVENOUS | Status: AC | PRN
  Administered 2013-09-13: 2.1 via ORAL

## 2013-09-14 NOTE — Progress Notes (Signed)
Quick Note:  Please inform the patient that GES was normal and to continue current plan of action ______ 

## 2013-09-19 ENCOUNTER — Other Ambulatory Visit: Payer: Self-pay | Admitting: Internal Medicine

## 2013-09-21 ENCOUNTER — Encounter: Payer: Self-pay | Admitting: Physician Assistant

## 2013-09-21 NOTE — Telephone Encounter (Signed)
Last OV was 08/10/13--please advise if okay to refill

## 2013-10-01 ENCOUNTER — Encounter: Payer: Self-pay | Admitting: Internal Medicine

## 2013-10-01 ENCOUNTER — Ambulatory Visit (INDEPENDENT_AMBULATORY_CARE_PROVIDER_SITE_OTHER): Payer: Managed Care, Other (non HMO) | Admitting: Internal Medicine

## 2013-10-01 VITALS — BP 118/68 | HR 125 | Temp 98.7°F | Resp 16 | Ht 63.0 in | Wt 209.0 lb

## 2013-10-01 DIAGNOSIS — F418 Other specified anxiety disorders: Secondary | ICD-10-CM

## 2013-10-01 DIAGNOSIS — IMO0002 Reserved for concepts with insufficient information to code with codable children: Secondary | ICD-10-CM

## 2013-10-01 DIAGNOSIS — IMO0001 Reserved for inherently not codable concepts without codable children: Secondary | ICD-10-CM

## 2013-10-01 DIAGNOSIS — F341 Dysthymic disorder: Secondary | ICD-10-CM

## 2013-10-01 DIAGNOSIS — I10 Essential (primary) hypertension: Secondary | ICD-10-CM

## 2013-10-01 DIAGNOSIS — Z1231 Encounter for screening mammogram for malignant neoplasm of breast: Secondary | ICD-10-CM | POA: Insufficient documentation

## 2013-10-01 DIAGNOSIS — E1165 Type 2 diabetes mellitus with hyperglycemia: Secondary | ICD-10-CM

## 2013-10-01 MED ORDER — DAPAGLIFLOZIN PROPANEDIOL 5 MG PO TABS: 1.0000 | ORAL_TABLET | Freq: Every day | ORAL | Status: DC

## 2013-10-01 MED ORDER — INSULIN DETEMIR 100 UNIT/ML FLEXPEN: 30.0000 [IU] | PEN_INJECTOR | Freq: Every day | SUBCUTANEOUS | Status: DC

## 2013-10-01 MED ORDER — VILAZODONE HCL 10 & 20 & 40 MG PO KIT: 1.0000 | PACK | Freq: Every day | ORAL | Status: DC

## 2013-10-01 NOTE — Progress Notes (Signed)
   Subjective:    Patient ID: Meghan Park, female    DOB: Mar 04, 1971, 43 y.o.   MRN: [id number]  Diabetes She presents for her follow-up diabetic visit. She has type 2 diabetes mellitus. The initial diagnosis of diabetes was made 15 years ago. Her disease course has been worsening. Hypoglycemia symptoms include nervousness/anxiousness. Pertinent negatives for hypoglycemia include no confusion. Associated symptoms include polydipsia, polyphagia and polyuria. Pertinent negatives for diabetes include no blurred vision, no chest pain, no fatigue, no foot paresthesias, no foot ulcerations, no visual change, no weakness and no weight loss. There are no hypoglycemic complications. There are no diabetic complications. Current diabetic treatment includes oral agent (dual therapy). She is compliant with treatment most of the time. Her weight is stable. Meal planning includes avoidance of concentrated sweets. She has not had a previous visit with a dietician. She participates in exercise intermittently. There is no change in her home blood glucose trend. An ACE inhibitor/angiotensin II receptor blocker is being taken. She does not see a podiatrist.Eye exam is current.      Review of Systems  Constitutional: Negative.  Negative for fever, chills, weight loss, diaphoresis, appetite change, fatigue and unexpected weight change.  HENT: Negative.   Eyes: Negative.  Negative for blurred vision.  Respiratory: Negative.  Negative for cough, choking, chest tightness, wheezing and stridor.   Cardiovascular: Negative.  Negative for chest pain, palpitations and leg swelling.  Gastrointestinal: Negative.  Negative for nausea, vomiting, abdominal pain, diarrhea and constipation.  Endocrine: Positive for polydipsia, polyphagia and polyuria. Negative for cold intolerance and heat intolerance.  Genitourinary: Negative.  Negative for hematuria and difficulty urinating.  Musculoskeletal: Negative.   Skin: Negative.     Allergic/Immunologic: Negative.   Neurological: Negative.  Negative for weakness.  Hematological: Negative.  Negative for adenopathy. Does not bruise/bleed easily.  Psychiatric/Behavioral: Positive for sleep disturbance and dysphoric mood. Negative for suicidal ideas, hallucinations, behavioral problems, confusion, self-injury, decreased concentration and agitation. The patient is nervous/anxious. The patient is not hyperactive.        Objective:   Physical Exam  Vitals reviewed. Constitutional: She is oriented to person, place, and time. She appears well-developed and well-nourished. No distress.  HENT:  Head: Normocephalic and atraumatic.  Mouth/Throat: Oropharynx is clear and moist. No oropharyngeal exudate.  Eyes: Conjunctivae are normal. Right eye exhibits no discharge. Left eye exhibits no discharge. No scleral icterus.  Neck: Normal range of motion. Neck supple. No JVD present. No tracheal deviation present. No thyromegaly present.  Cardiovascular: Normal rate, regular rhythm, normal heart sounds and intact distal pulses.  Exam reveals no gallop and no friction rub.   No murmur heard. Pulmonary/Chest: Effort normal and breath sounds normal. No stridor. No respiratory distress. She has no wheezes. She has no rales. She exhibits no tenderness.  Abdominal: Soft. Bowel sounds are normal. She exhibits no distension and no mass. There is no tenderness. There is no rebound and no guarding.  Musculoskeletal: Normal range of motion. She exhibits no edema and no tenderness.  Lymphadenopathy:    She has no cervical adenopathy.  Neurological: She is oriented to person, place, and time.  Skin: Skin is warm and dry. No rash noted. She is not diaphoretic. No erythema. No pallor.  Psychiatric: She has a normal mood and affect. Her behavior is normal. Judgment and thought content normal.          Assessment & Plan:

## 2013-10-01 NOTE — Progress Notes (Signed)
Pre visit review using our clinic review tool, if applicable. No additional management support is needed unless otherwise documented below in the visit note. 

## 2013-10-01 NOTE — Assessment & Plan Note (Signed)
Her s/s are not very controlled with zoloft so I have asked her to slowly taper the zoloft dose and to layer Viibryd with the dose pak She will cont klonopin as needed

## 2013-10-01 NOTE — Patient Instructions (Signed)
Type 2 Diabetes Mellitus, Adult Type 2 diabetes mellitus, often simply referred to as type 2 diabetes, is a long-lasting (chronic) disease. In type 2 diabetes, the pancreas does not make enough insulin (a hormone), the cells are less responsive to the insulin that is made (insulin resistance), or both. Normally, insulin moves sugars from food into the tissue cells. The tissue cells use the sugars for energy. The lack of insulin or the lack of normal response to insulin causes excess sugars to build up in the blood instead of going into the tissue cells. As a result, high blood sugar (hyperglycemia) develops. The effect of high sugar (glucose) levels can cause many complications. Type 2 diabetes was also previously called adult-onset diabetes but it can occur at any age.  RISK FACTORS  A person is predisposed to developing type 2 diabetes if someone in the family has the disease and also has one or more of the following primary risk factors:  Overweight.  An inactive lifestyle.  A history of consistently eating high-calorie foods. Maintaining a normal weight and regular physical activity can reduce the chance of developing type 2 diabetes. SYMPTOMS  A person with type 2 diabetes may not show symptoms initially. The symptoms of type 2 diabetes appear slowly. The symptoms include:  Increased thirst (polydipsia).  Increased urination (polyuria).  Increased urination during the night (nocturia).  Weight loss. This weight loss may be rapid.  Frequent, recurring infections.  Tiredness (fatigue).  Weakness.  Vision changes, such as blurred vision.  Fruity smell to your breath.  Abdominal pain.  Nausea or vomiting.  Cuts or bruises which are slow to heal.  Tingling or numbness in the hands or feet. DIAGNOSIS Type 2 diabetes is frequently not diagnosed until complications of diabetes are present. Type 2 diabetes is diagnosed when symptoms or complications are present and when blood  glucose levels are increased. Your blood glucose level may be checked by one or more of the following blood tests:  A fasting blood glucose test. You will not be allowed to eat for at least 8 hours before a blood sample is taken.  A random blood glucose test. Your blood glucose is checked at any time of the day regardless of when you ate.  A hemoglobin A1c blood glucose test. A hemoglobin A1c test provides information about blood glucose control over the previous 3 months.  An oral glucose tolerance test (OGTT). Your blood glucose is measured after you have not eaten (fasted) for 2 hours and then after you drink a glucose-containing beverage. TREATMENT   You may need to take insulin or diabetes medicine daily to keep blood glucose levels in the desired range.  You will need to match insulin dosing with exercise and healthy food choices. The treatment goal is to maintain the before meal blood sugar (preprandial glucose) level at 70 130 mg/dL. HOME CARE INSTRUCTIONS   Have your hemoglobin A1c level checked twice a year.  Perform daily blood glucose monitoring as directed by your caregiver.  Monitor urine ketones when you are ill and as directed by your caregiver.  Take your diabetes medicine or insulin as directed by your caregiver to maintain your blood glucose levels in the desired range.  Never run out of diabetes medicine or insulin. It is needed every day.  Adjust insulin based on your intake of carbohydrates. Carbohydrates can raise blood glucose levels but need to be included in your diet. Carbohydrates provide vitamins, minerals, and fiber which are an essential part of   a healthy diet. Carbohydrates are found in fruits, vegetables, whole grains, dairy products, legumes, and foods containing added sugars.    Eat healthy foods. Alternate 3 meals with 3 snacks.  Lose weight if overweight.  Carry a medical alert card or wear your medical alert jewelry.  Carry a 15 gram  carbohydrate snack with you at all times to treat low blood glucose (hypoglycemia). Some examples of 15 gram carbohydrate snacks include:  Glucose tablets, 3 or 4   Glucose gel, 15 gram tube  Raisins, 2 tablespoons (24 grams)  Jelly beans, 6  Animal crackers, 8  Regular pop, 4 ounces (120 mL)  Gummy treats, 9  Recognize hypoglycemia. Hypoglycemia occurs with blood glucose levels of 70 mg/dL and below. The risk for hypoglycemia increases when fasting or skipping meals, during or after intense exercise, and during sleep. Hypoglycemia symptoms can include:  Tremors or shakes.  Decreased ability to concentrate.  Sweating.  Increased heart rate.  Headache.  Dry mouth.  Hunger.  Irritability.  Anxiety.  Restless sleep.  Altered speech or coordination.  Confusion.  Treat hypoglycemia promptly. If you are alert and able to safely swallow, follow the 15:15 rule:  Take 15 20 grams of rapid-acting glucose or carbohydrate. Rapid-acting options include glucose gel, glucose tablets, or 4 ounces (120 mL) of fruit juice, regular soda, or low fat milk.  Check your blood glucose level 15 minutes after taking the glucose.  Take 15 20 grams more of glucose if the repeat blood glucose level is still 70 mg/dL or below.  Eat a meal or snack within 1 hour once blood glucose levels return to normal.    Be alert to polyuria and polydipsia which are early signs of hyperglycemia. An early awareness of hyperglycemia allows for prompt treatment. Treat hyperglycemia as directed by your caregiver.  Engage in at least 150 minutes of moderate-intensity physical activity a week, spread over at least 3 days of the week or as directed by your caregiver. In addition, you should engage in resistance exercise at least 2 times a week or as directed by your caregiver.  Adjust your medicine and food intake as needed if you start a new exercise or sport.  Follow your sick day plan at any time you  are unable to eat or drink as usual.  Avoid tobacco use.  Limit alcohol intake to no more than 1 drink per day for nonpregnant women and 2 drinks per day for men. You should drink alcohol only when you are also eating food. Talk with your caregiver whether alcohol is safe for you. Tell your caregiver if you drink alcohol several times a week.  Follow up with your caregiver regularly.  Schedule an eye exam soon after the diagnosis of type 2 diabetes and then annually.  Perform daily skin and foot care. Examine your skin and feet daily for cuts, bruises, redness, nail problems, bleeding, blisters, or sores. A foot exam by a caregiver should be done annually.  Brush your teeth and gums at least twice a day and floss at least once a day. Follow up with your dentist regularly.  Share your diabetes management plan with your workplace or school.  Stay up-to-date with immunizations.  Learn to manage stress.  Obtain ongoing diabetes education and support as needed.  Participate in, or seek rehabilitation as needed to maintain or improve independence and quality of life. Request a physical or occupational therapy referral if you are having foot or hand numbness or difficulties with grooming,   dressing, eating, or physical activity. SEEK MEDICAL CARE IF:   You are unable to eat food or drink fluids for more than 6 hours.  You have nausea and vomiting for more than 6 hours.  Your blood glucose level is over 240 mg/dL.  There is a change in mental status.  You develop an additional serious illness.  You have diarrhea for more than 6 hours.  You have been sick or have had a fever for a couple of days and are not getting better.  You have pain during any physical activity.  SEEK IMMEDIATE MEDICAL CARE IF:  You have difficulty breathing.  You have moderate to large ketone levels. MAKE SURE YOU:  Understand these instructions.  Will watch your condition.  Will get help right away if  you are not doing well or get worse. Document Released: 07/12/2005 Document Revised: 04/05/2012 Document Reviewed: 02/08/2012 [employer] Patient Information 2014 [employer].  

## 2013-10-01 NOTE — Assessment & Plan Note (Signed)
Her blood sugars are not well controlled so I have asked her to add levemir and farxiga to her med regimen She was referred for diabetic education and for an eye exam

## 2013-10-01 NOTE — Assessment & Plan Note (Signed)
Her BP is well controlled 

## 2013-10-03 ENCOUNTER — Telehealth: Payer: Self-pay

## 2013-10-03 NOTE — Telephone Encounter (Signed)
Relevant patient education assigned to patient using Emmi. ° °

## 2013-10-08 ENCOUNTER — Other Ambulatory Visit: Payer: Self-pay | Admitting: Internal Medicine

## 2013-10-08 ENCOUNTER — Encounter: Payer: Self-pay | Admitting: Internal Medicine

## 2013-10-08 DIAGNOSIS — IMO0002 Reserved for concepts with insufficient information to code with codable children: Secondary | ICD-10-CM

## 2013-10-08 DIAGNOSIS — E1165 Type 2 diabetes mellitus with hyperglycemia: Secondary | ICD-10-CM

## 2013-10-08 MED ORDER — INSULIN PEN NEEDLE 32G X 4 MM MISC: 1.0000 | Freq: Every day | Status: DC

## 2013-10-08 MED ORDER — INSULIN DETEMIR 100 UNIT/ML FLEXPEN: 30.0000 [IU] | PEN_INJECTOR | Freq: Every day | SUBCUTANEOUS | Status: DC

## 2013-10-16 ENCOUNTER — Ambulatory Visit: Payer: Managed Care, Other (non HMO)

## 2013-10-17 ENCOUNTER — Encounter (INDEPENDENT_AMBULATORY_CARE_PROVIDER_SITE_OTHER): Payer: Managed Care, Other (non HMO) | Admitting: Ophthalmology

## 2013-10-17 DIAGNOSIS — E1139 Type 2 diabetes mellitus with other diabetic ophthalmic complication: Secondary | ICD-10-CM

## 2013-10-17 DIAGNOSIS — H43819 Vitreous degeneration, unspecified eye: Secondary | ICD-10-CM

## 2013-10-17 DIAGNOSIS — E1165 Type 2 diabetes mellitus with hyperglycemia: Secondary | ICD-10-CM

## 2013-10-17 DIAGNOSIS — E11319 Type 2 diabetes mellitus with unspecified diabetic retinopathy without macular edema: Secondary | ICD-10-CM

## 2013-10-17 DIAGNOSIS — H2510 Age-related nuclear cataract, unspecified eye: Secondary | ICD-10-CM

## 2013-10-17 LAB — HM DIABETES EYE EXAM

## 2013-10-19 ENCOUNTER — Other Ambulatory Visit: Payer: Self-pay

## 2013-10-19 MED ORDER — CYCLOBENZAPRINE HCL 10 MG PO TABS: 10.0000 mg | ORAL_TABLET | Freq: Every day | ORAL | Status: DC

## 2013-10-19 MED ORDER — CLONAZEPAM 1 MG PO TABS: ORAL_TABLET | ORAL | Status: DC

## 2013-10-19 NOTE — Telephone Encounter (Signed)
rx refill for cyclobenzaprin 10 mg [facility] to [facility]

## 2013-10-24 ENCOUNTER — Ambulatory Visit
Admission: RE | Admit: 2013-10-24 | Discharge: 2013-10-24 | Disposition: A | Discharge status: Home / Self Care | Payer: Managed Care, Other (non HMO) | Source: Ambulatory Visit | Attending: Internal Medicine | Admitting: Internal Medicine

## 2013-10-24 DIAGNOSIS — Z1231 Encounter for screening mammogram for malignant neoplasm of breast: Secondary | ICD-10-CM

## 2013-10-24 LAB — HM MAMMOGRAPHY: HM Mammogram: NORMAL

## 2013-10-30 ENCOUNTER — Other Ambulatory Visit: Payer: Self-pay | Admitting: Internal Medicine

## 2013-10-30 DIAGNOSIS — F329 Major depressive disorder, single episode, unspecified: Secondary | ICD-10-CM

## 2013-10-30 DIAGNOSIS — F32A Depression, unspecified: Secondary | ICD-10-CM

## 2013-10-30 MED ORDER — VILAZODONE HCL 40 MG PO TABS: 40.0000 mg | ORAL_TABLET | Freq: Every day | ORAL | Status: DC

## 2013-11-21 ENCOUNTER — Ambulatory Visit: Payer: Managed Care, Other (non HMO) | Admitting: *Deleted

## 2013-11-22 ENCOUNTER — Telehealth: Payer: Self-pay

## 2013-11-22 NOTE — Telephone Encounter (Signed)
OK to fill this prescription with additional refills x0 Thank you!  

## 2013-11-22 NOTE — Telephone Encounter (Signed)
[facility]  request refills for cyclobenzaprine 10mg  . Rx last written 10/19/13 #30/0rf and pt last seen 10/01/13   . Please advise Thanks

## 2013-11-23 MED ORDER — CYCLOBENZAPRINE HCL 10 MG PO TABS: 10.0000 mg | ORAL_TABLET | Freq: Every day | ORAL | Status: DC

## 2013-11-23 NOTE — Telephone Encounter (Signed)
RX signed and faxed.

## 2013-11-27 ENCOUNTER — Encounter: Payer: Managed Care, Other (non HMO) | Attending: Internal Medicine | Admitting: Dietician

## 2013-11-27 ENCOUNTER — Encounter: Payer: Self-pay | Admitting: Dietician

## 2013-11-27 VITALS — Ht 62.0 in | Wt 198.9 lb

## 2013-11-27 DIAGNOSIS — E669 Obesity, unspecified: Secondary | ICD-10-CM

## 2013-11-27 DIAGNOSIS — E119 Type 2 diabetes mellitus without complications: Secondary | ICD-10-CM | POA: Insufficient documentation

## 2013-11-27 DIAGNOSIS — Z713 Dietary counseling and surveillance: Secondary | ICD-10-CM | POA: Insufficient documentation

## 2013-11-27 DIAGNOSIS — IMO0002 Reserved for concepts with insufficient information to code with codable children: Secondary | ICD-10-CM

## 2013-11-27 DIAGNOSIS — E1165 Type 2 diabetes mellitus with hyperglycemia: Secondary | ICD-10-CM

## 2013-11-27 DIAGNOSIS — E785 Hyperlipidemia, unspecified: Secondary | ICD-10-CM

## 2013-11-27 NOTE — Progress Notes (Signed)
Appt start time: 1630 end time:  1730.  Assessment:  Patient was seen on 11/27/13 for individual diabetes education. Pt admits she was very disturbed and emotional about the progression to insulin therapy at last appt in March, and since has placed much more emphasis on controlling her diet. She has lost 10lbs, now down to 198.9 lbs, but was hoping for greater weight loss.   Current HbA1c: 9.6  Preferred Learning Style:   No preference indicated   Learning Readiness:   Ready  MEDICATIONS: see list  DIETARY INTAKE: some changes since march appt with MD Usual eating pattern includes 3 meals and 3 snacks per day. Everyday foods include eggs with breakfast, PB sandwich.  Avoided foods include candies and most desserts.    24-hr recall:  B ( AM): eggs, canadian bacon, english muffin (white), cheese,  Coffee (equal and 1 cream).  Snk ( AM): yogurt, fruited, water  L ( PM): chix noodle soup, PB sandwich on whole wheat, diet soda Snk ( PM): string cheese and fruit or popcorn (microwave) with water D ( PM): stir fry of vegetables (onions, peppers, mushrooms, broccoli) and meat (shrimp, chix, or beef) with a salad (thousand island dressing with about 1 TBSP). Sometimes rice, mostly avoiding bread. Water or diet soda.  Snk ( PM): PB sandwich and water Beverages: coffee, water, diet soda, no EtOH, eliminated juices, no kool aid, no tea  Usual physical activity: walks during work, with some light lifting of boxes. No regular exercise, no active hobbies noted.   Progress Towards Goal(s):  In progress.   Nutritional Diagnosis:  NI-5.8.2 Excessive carbohydrate intake As related to low physical activity level, type 2 DM control.  As evidenced by HgA1c 9.6, diet recall reflecting lunch meal >60 grams CHO, plus high CHO snacks.    Intervention:  Nutrition counseling provided.  Discussed diabetes disease process and treatment options.  Discussed physiology of diabetes and role of obesity on insulin  resistance.  Encouraged moderate weight reduction to improve glucose levels.  Discussed role of medications and diet in glucose control  Provided education on macronutrients on glucose levels.  Provided education on carb counting, importance of regularly scheduled meals/snacks, and meal planning  Discussed effects of physical activity on glucose levels and long-term glucose control.  Recommended 150 minutes of physical activity/week.  Reviewed patient medications.  Discussed role of medication on blood glucose and possible side effects  Discussed blood glucose monitoring and interpretation.  Discussed recommended target ranges and individual ranges.    Described short-term complications: hyper- and hypo-glycemia.  Discussed causes,symptoms, and treatment options.  Discussed prevention, detection, and treatment of long-term complications.  Discussed the role of prolonged elevated glucose levels on body systems.  Discussed role of stress on blood glucose levels and discussed strategies to manage psychosocial issues.  Discussed recommendations for long-term diabetes self-care.  Established checklist for medical, dental, and emotional self-care.  Teaching Method Utilized:  Visual Auditory  Handouts given during visit include:  Living Well with Diabetes  The Plate Method  Best Pro, Fat, and CHO rich foods for systemic health  Barriers to learning/adherence to lifestyle change: minimal exercise, long hx of no dietary changes  Diabetes self-care support plan:   [facility] support group  RD focused session on gaining better understanding of type 2 DM pathophysiology, and best methods of control, particularly exercise and CHO controlled diet to achieve wt loss. Pt responded well to using the Plate Method in favor of CHO counting, as she says CHO counting "  stresses" her. Pt states that she now feels she understands her disease much better, and feels more in control of her treatment.   Goals for  the pt: Utilize the Plate Method for lunch and dinner Incorporate whole grains over refined by choosing whole wheat breads, english muffins, and pastas. Focus on large portions of nonstarchy vegetables and moderate portions of low fat proteins to increase fullness without adding significant kcal. Exercise of pt's choice 30 minutes daily, progressing by 5 minutes per day q 2 weeks, until 60 minutes daily achieved. Choose higher protein and fiber snack foods, particularly nuts, seeds, greek yogurt, and fruits.  Demonstrated degree of understanding via:  Teach Back   Monitoring/Evaluation:  Dietary intake, exercise, portion control, HgA1c, and body weight in 2 month(s).

## 2013-12-20 ENCOUNTER — Encounter: Payer: Self-pay | Admitting: Internal Medicine

## 2013-12-20 ENCOUNTER — Other Ambulatory Visit: Payer: Self-pay | Admitting: Family Medicine

## 2013-12-21 ENCOUNTER — Ambulatory Visit (INDEPENDENT_AMBULATORY_CARE_PROVIDER_SITE_OTHER): Payer: Managed Care, Other (non HMO) | Admitting: Internal Medicine

## 2013-12-21 ENCOUNTER — Other Ambulatory Visit (INDEPENDENT_AMBULATORY_CARE_PROVIDER_SITE_OTHER): Payer: Managed Care, Other (non HMO)

## 2013-12-21 ENCOUNTER — Encounter: Payer: Self-pay | Admitting: Internal Medicine

## 2013-12-21 VITALS — BP 110/68 | HR 96 | Temp 98.5°F | Resp 16 | Wt 199.0 lb

## 2013-12-21 DIAGNOSIS — E1165 Type 2 diabetes mellitus with hyperglycemia: Secondary | ICD-10-CM

## 2013-12-21 DIAGNOSIS — F418 Other specified anxiety disorders: Secondary | ICD-10-CM

## 2013-12-21 DIAGNOSIS — IMO0002 Reserved for concepts with insufficient information to code with codable children: Secondary | ICD-10-CM

## 2013-12-21 DIAGNOSIS — IMO0001 Reserved for inherently not codable concepts without codable children: Secondary | ICD-10-CM

## 2013-12-21 DIAGNOSIS — D509 Iron deficiency anemia, unspecified: Secondary | ICD-10-CM

## 2013-12-21 DIAGNOSIS — H8110 Benign paroxysmal vertigo, unspecified ear: Secondary | ICD-10-CM | POA: Insufficient documentation

## 2013-12-21 DIAGNOSIS — F341 Dysthymic disorder: Secondary | ICD-10-CM

## 2013-12-21 DIAGNOSIS — I10 Essential (primary) hypertension: Secondary | ICD-10-CM

## 2013-12-21 DIAGNOSIS — D510 Vitamin B12 deficiency anemia due to intrinsic factor deficiency: Secondary | ICD-10-CM

## 2013-12-21 LAB — BASIC METABOLIC PANEL
BUN: 8 mg/dL (ref 6–23)
CHLORIDE: 100 meq/L (ref 96–112)
CO2: 26 mEq/L (ref 19–32)
CREATININE: 0.8 mg/dL (ref 0.4–1.2)
Calcium: 9 mg/dL (ref 8.4–10.5)
GFR: 82.07 mL/min (ref >60.00)
GLUCOSE: 120 mg/dL — AB (ref 70–99)
POTASSIUM: 4 meq/L (ref 3.5–5.1)
Sodium: 134 mEq/L — ABNORMAL LOW (ref 135–145)

## 2013-12-21 LAB — CBC WITH DIFFERENTIAL/PLATELET
BASOS PCT: 0.5 % (ref 0.0–3.0)
Basophils Absolute: 0.1 10*3/uL (ref 0.0–0.1)
EOS ABS: 0.1 10*3/uL (ref 0.0–0.7)
Eosinophils Relative: 1.4 % (ref 0.0–5.0)
HCT: 34.2 % — ABNORMAL LOW (ref 36.0–46.0)
HEMOGLOBIN: 11.6 g/dL — AB (ref 12.0–15.0)
LYMPHS PCT: 29.9 % (ref 12.0–46.0)
Lymphs Abs: 3.1 10*3/uL (ref 0.7–4.0)
MCHC: 33.8 g/dL (ref 30.0–36.0)
MCV: 81.1 fl (ref 78.0–100.0)
MONO ABS: 0.7 10*3/uL (ref 0.1–1.0)
Monocytes Relative: 6.7 % (ref 3.0–12.0)
NEUTROS ABS: 6.3 10*3/uL (ref 1.4–7.7)
NEUTROS PCT: 61.5 % (ref 43.0–77.0)
Platelets: 263 10*3/uL (ref 150.0–400.0)
RBC: 4.22 Mil/uL (ref 3.87–5.11)
RDW: 14.7 % (ref 11.5–15.5)
WBC: 10.3 10*3/uL (ref 4.0–10.5)

## 2013-12-21 LAB — IBC PANEL
IRON: 31 ug/dL — AB (ref 42–145)
Saturation Ratios: 6.2 % — ABNORMAL LOW (ref 20.0–50.0)
TRANSFERRIN: 355.6 mg/dL (ref 212.0–360.0)

## 2013-12-21 LAB — FERRITIN: Ferritin: 9.6 ng/mL — ABNORMAL LOW (ref 10.0–291.0)

## 2013-12-21 LAB — FOLATE: Folate: 21.5 ng/mL (ref >5.9)

## 2013-12-21 LAB — TSH: TSH: 1.28 u[IU]/mL (ref 0.35–4.50)

## 2013-12-21 LAB — HEMOGLOBIN A1C: HEMOGLOBIN A1C: 7.1 % — AB (ref 4.6–6.5)

## 2013-12-21 LAB — VITAMIN B12: Vitamin B-12: 235 pg/mL (ref 211–911)

## 2013-12-21 MED ORDER — METFORMIN HCL 1000 MG PO TABS: 1000.0000 mg | ORAL_TABLET | Freq: Every day | ORAL | Status: DC

## 2013-12-21 MED ORDER — CYCLOBENZAPRINE HCL 10 MG PO TABS: 10.0000 mg | ORAL_TABLET | Freq: Every day | ORAL | Status: DC

## 2013-12-21 MED ORDER — MECLIZINE HCL 12.5 MG PO TABS: 12.5000 mg | ORAL_TABLET | Freq: Three times a day (TID) | ORAL | Status: DC | PRN

## 2013-12-21 MED ORDER — CLONAZEPAM 1 MG PO TABS: ORAL_TABLET | ORAL | Status: DC

## 2013-12-21 MED ORDER — DEPLIN 15 15-90.314 MG PO CAPS: 1.0000 | ORAL_CAPSULE | Freq: Every day | ORAL | Status: DC

## 2013-12-21 NOTE — Assessment & Plan Note (Signed)
Her BP is well controlled 

## 2013-12-21 NOTE — Assessment & Plan Note (Signed)
I will recheck her A1C and will monitor her renal function 

## 2013-12-21 NOTE — Assessment & Plan Note (Signed)
She will cont her current meds I think deplin will help as well

## 2013-12-21 NOTE — Progress Notes (Signed)
Subjective:    Patient ID: Meghan Park, female    DOB: 1970-10-13, 43 y.o.   MRN: [id number]  HPI Comments: She returns complaining of a one week history of vertigo when she turns her head, dizziness, lightheadedness, and insomnia.     Review of Systems  Constitutional: Positive for fatigue. Negative for fever, chills, diaphoresis, activity change, appetite change and unexpected weight change.  HENT: Negative.   Eyes: Negative.   Respiratory: Negative.  Negative for cough, choking, chest tightness, shortness of breath and stridor.   Cardiovascular: Negative.  Negative for chest pain, palpitations and leg swelling.  Gastrointestinal: Negative.  Negative for nausea, vomiting, abdominal pain, diarrhea, constipation and blood in stool.  Endocrine: Negative.  Negative for polydipsia, polyphagia and polyuria.  Genitourinary: Negative.   Musculoskeletal: Negative.   Skin: Negative.   Allergic/Immunologic: Negative.   Neurological: Positive for dizziness and light-headedness. Negative for tremors, seizures, syncope, facial asymmetry, speech difficulty, weakness, numbness and headaches.  Hematological: Negative.  Negative for adenopathy. Does not bruise/bleed easily.  Psychiatric/Behavioral: Positive for sleep disturbance and dysphoric mood (crying spells). Negative for suicidal ideas, hallucinations, behavioral problems, confusion, self-injury, decreased concentration and agitation. The patient is nervous/anxious. The patient is not hyperactive.        Objective:   Physical Exam  Vitals reviewed. Constitutional: She is oriented to person, place, and time. She appears well-developed and well-nourished.  Non-toxic appearance. She does not have a sickly appearance. She does not appear ill. No distress.  HENT:  Head: Normocephalic and atraumatic.  Mouth/Throat: Oropharynx is clear and moist. No oropharyngeal exudate.  Eyes: Conjunctivae and EOM are normal. Pupils are equal, round, and  reactive to light. Right eye exhibits no discharge. Left eye exhibits no discharge. No scleral icterus.  Neck: Normal range of motion. Neck supple. No JVD present. No tracheal deviation present. No thyromegaly present.  Cardiovascular: Normal rate, regular rhythm, normal heart sounds and intact distal pulses.  Exam reveals no gallop and no friction rub.   No murmur heard. Pulmonary/Chest: Effort normal and breath sounds normal. No stridor. No respiratory distress. She has no wheezes. She has no rales. She exhibits no tenderness.  Abdominal: Soft. Bowel sounds are normal. She exhibits no distension and no mass. There is no tenderness. There is no rebound and no guarding.  Musculoskeletal: Normal range of motion. She exhibits no edema and no tenderness.  Lymphadenopathy:    She has no cervical adenopathy.  Neurological: She is alert and oriented to person, place, and time. She has normal strength. She displays no atrophy, no tremor and normal reflexes. No cranial nerve deficit or sensory deficit. She exhibits normal muscle tone. She displays a negative Romberg sign. She displays no seizure activity. Coordination and gait normal. She displays no Babinski's sign on the right side. She displays no Babinski's sign on the left side.  Reflex Scores:      Tricep reflexes are 1+ on the left side.      Bicep reflexes are 1+ on the right side and 1+ on the left side.      Brachioradialis reflexes are 1+ on the right side and 1+ on the left side.      Patellar reflexes are 1+ on the right side and 1+ on the left side.      Achilles reflexes are 1+ on the right side and 1+ on the left side. Skin: Skin is warm and dry. No rash noted. She is not diaphoretic. No erythema. No pallor.  Psychiatric: She has a normal mood and affect. Her behavior is normal. Judgment and thought content normal.    Lab Results  Component Value Date   WBC 7.6 04/06/2013   HGB 11.7* 04/06/2013   HCT 35.0* 04/06/2013   PLT 212.0  04/06/2013   GLUCOSE 239* 04/06/2013   CHOL 179 04/06/2013   TRIG 149.0 04/06/2013   HDL 42.50 04/06/2013   LDLCALC 107* 04/06/2013   ALT 20 04/06/2013   AST 31 04/06/2013   NA 134* 04/06/2013   K 3.8 04/06/2013   CL 101 04/06/2013   CREATININE 0.6 04/06/2013   BUN 5* 04/06/2013   CO2 25 04/06/2013   INR 1.18 04/22/2012   HGBA1C 9.6* 08/10/2013   MICROALBUR 0.6 04/06/2013        Assessment & Plan:

## 2013-12-21 NOTE — Assessment & Plan Note (Signed)
I will recheck her CBC and will check her vitamin levels 

## 2013-12-21 NOTE — Assessment & Plan Note (Signed)
She will try meclizine for symptom relief

## 2013-12-21 NOTE — Patient Instructions (Signed)
Vertigo Vertigo means you feel like you or your surroundings are moving when they are not. Vertigo can be dangerous if it occurs when you are at work, driving, or performing difficult activities.  CAUSES  Vertigo occurs when there is a conflict of signals sent to your brain from the visual and sensory systems in your body. There are many different causes of vertigo, including:  Infections, especially in the inner ear.  A bad reaction to a drug or misuse of alcohol and medicines.  Withdrawal from drugs or alcohol.  Rapidly changing positions, such as lying down or rolling over in bed.  A migraine headache.  Decreased blood flow to the brain.  Increased pressure in the brain from a head injury, infection, tumor, or bleeding. SYMPTOMS  You may feel as though the world is spinning around or you are falling to the ground. Because your balance is upset, vertigo can cause nausea and vomiting. You may have involuntary eye movements (nystagmus). DIAGNOSIS  Vertigo is usually diagnosed by physical exam. If the cause of your vertigo is unknown, your caregiver may perform imaging tests, such as an MRI scan (magnetic resonance imaging). TREATMENT  Most cases of vertigo resolve on their own, without treatment. Depending on the cause, your caregiver may prescribe certain medicines. If your vertigo is related to body position issues, your caregiver may recommend movements or procedures to correct the problem. In rare cases, if your vertigo is caused by certain inner ear problems, you may need surgery. HOME CARE INSTRUCTIONS   Follow your caregiver's instructions.  Avoid driving.  Avoid operating heavy machinery.  Avoid performing any tasks that would be dangerous to you or others during a vertigo episode.  Tell your caregiver if you notice that certain medicines seem to be causing your vertigo. Some of the medicines used to treat vertigo episodes can actually make them worse in some people. SEEK  IMMEDIATE MEDICAL CARE IF:   Your medicines do not relieve your vertigo or are making it worse.  You develop problems with talking, walking, weakness, or using your arms, hands, or legs.  You develop severe headaches.  Your nausea or vomiting continues or gets worse.  You develop visual changes.  A family member notices behavioral changes.  Your condition gets worse. MAKE SURE YOU:  Understand these instructions.  Will watch your condition.  Will get help right away if you are not doing well or get worse. Document Released: 04/21/2005 Document Revised: 10/04/2011 Document Reviewed: 01/28/2011 Exit[employer] Patient Information 2014 [employer].  

## 2013-12-21 NOTE — Progress Notes (Signed)
Pre visit review using our clinic review tool, if applicable. No additional management support is needed unless otherwise documented below in the visit note. 

## 2013-12-22 ENCOUNTER — Encounter: Payer: Self-pay | Admitting: Internal Medicine

## 2013-12-22 MED ORDER — FERRALET 90 90-1 MG PO TABS: 1.0000 | ORAL_TABLET | Freq: Every day | ORAL | Status: DC

## 2013-12-22 NOTE — Addendum Note (Signed)
Addended by: Etta Grandchild on: 12/22/2013 11:24 AM   Modules accepted: Orders

## 2013-12-23 NOTE — Telephone Encounter (Signed)
Needs office visit.

## 2013-12-28 ENCOUNTER — Other Ambulatory Visit: Payer: Self-pay | Admitting: Internal Medicine

## 2013-12-28 ENCOUNTER — Encounter: Payer: Self-pay | Admitting: Internal Medicine

## 2013-12-28 NOTE — Telephone Encounter (Signed)
Last office visit with you was 12/21/13--Rx last filled by Nicki Reaper NP 07/30/2013 with 4 refills--please advise

## 2013-12-31 ENCOUNTER — Encounter: Payer: Self-pay | Admitting: Internal Medicine

## 2014-01-03 ENCOUNTER — Telehealth: Payer: Self-pay

## 2014-01-03 NOTE — Telephone Encounter (Signed)
Some people metabolize sugar into alcohol

## 2014-01-03 NOTE — Telephone Encounter (Signed)
Pt states at her last visit she took drug test and she tested positive for alcohol.  Pt wanted to understand how it appeared in her urine, if she hasn't had a drink of alcohol in 5-7 years. Please advise

## 2014-01-04 ENCOUNTER — Ambulatory Visit: Payer: Managed Care, Other (non HMO) | Admitting: Internal Medicine

## 2014-01-04 NOTE — Telephone Encounter (Signed)
Pt.notified

## 2014-01-08 ENCOUNTER — Encounter: Payer: Self-pay | Admitting: Internal Medicine

## 2014-01-11 ENCOUNTER — Ambulatory Visit (INDEPENDENT_AMBULATORY_CARE_PROVIDER_SITE_OTHER): Payer: Managed Care, Other (non HMO) | Admitting: Internal Medicine

## 2014-01-11 ENCOUNTER — Encounter: Payer: Self-pay | Admitting: Internal Medicine

## 2014-01-11 VITALS — BP 110/76 | HR 80 | Temp 98.6°F | Resp 18 | Wt 196.0 lb

## 2014-01-11 DIAGNOSIS — F341 Dysthymic disorder: Secondary | ICD-10-CM

## 2014-01-11 DIAGNOSIS — D509 Iron deficiency anemia, unspecified: Secondary | ICD-10-CM

## 2014-01-11 DIAGNOSIS — IMO0001 Reserved for inherently not codable concepts without codable children: Secondary | ICD-10-CM

## 2014-01-11 DIAGNOSIS — IMO0002 Reserved for concepts with insufficient information to code with codable children: Secondary | ICD-10-CM

## 2014-01-11 DIAGNOSIS — F418 Other specified anxiety disorders: Secondary | ICD-10-CM

## 2014-01-11 DIAGNOSIS — E1165 Type 2 diabetes mellitus with hyperglycemia: Secondary | ICD-10-CM

## 2014-01-11 DIAGNOSIS — D519 Vitamin B12 deficiency anemia, unspecified: Secondary | ICD-10-CM

## 2014-01-11 DIAGNOSIS — D518 Other vitamin B12 deficiency anemias: Secondary | ICD-10-CM

## 2014-01-11 NOTE — Patient Instructions (Signed)
Type 2 Diabetes Mellitus, Adult Type 2 diabetes mellitus, often simply referred to as type 2 diabetes, is a long-lasting (chronic) disease. In type 2 diabetes, the pancreas does not make enough insulin (a hormone), the cells are less responsive to the insulin that is made (insulin resistance), or both. Normally, insulin moves sugars from food into the tissue cells. The tissue cells use the sugars for energy. The lack of insulin or the lack of normal response to insulin causes excess sugars to build up in the blood instead of going into the tissue cells. As a result, high blood sugar (hyperglycemia) develops. The effect of high sugar (glucose) levels can cause many complications. Type 2 diabetes was also previously called adult-onset diabetes but it can occur at any age.  RISK FACTORS  A person is predisposed to developing type 2 diabetes if someone in the family has the disease and also has one or more of the following primary risk factors:  Overweight.  An inactive lifestyle.  A history of consistently eating high-calorie foods. Maintaining a normal weight and regular physical activity can reduce the chance of developing type 2 diabetes. SYMPTOMS  A person with type 2 diabetes may not show symptoms initially. The symptoms of type 2 diabetes appear slowly. The symptoms include:  Increased thirst (polydipsia).  Increased urination (polyuria).  Increased urination during the night (nocturia).  Weight loss. This weight loss may be rapid.  Frequent, recurring infections.  Tiredness (fatigue).  Weakness.  Vision changes, such as blurred vision.  Fruity smell to your breath.  Abdominal pain.  Nausea or vomiting.  Cuts or bruises which are slow to heal.  Tingling or numbness in the hands or feet. DIAGNOSIS Type 2 diabetes is frequently not diagnosed until complications of diabetes are present. Type 2 diabetes is diagnosed when symptoms or complications are present and when blood  glucose levels are increased. Your blood glucose level may be checked by one or more of the following blood tests:  A fasting blood glucose test. You will not be allowed to eat for at least 8 hours before a blood sample is taken.  A random blood glucose test. Your blood glucose is checked at any time of the day regardless of when you ate.  A hemoglobin A1c blood glucose test. A hemoglobin A1c test provides information about blood glucose control over the previous 3 months.  An oral glucose tolerance test (OGTT). Your blood glucose is measured after you have not eaten (fasted) for 2 hours and then after you drink a glucose-containing beverage. TREATMENT   You may need to take insulin or diabetes medicine daily to keep blood glucose levels in the desired range.  If you use insulin, you may need to adjust the dosage depending on the carbohydrates that you eat with each meal or snack. The treatment goal is to maintain the before meal blood sugar (preprandial glucose) level at 70-130 mg/dL. HOME CARE INSTRUCTIONS   Have your hemoglobin A1c level checked twice a year.  Perform daily blood glucose monitoring as directed by your health care provider.  Monitor urine ketones when you are ill and as directed by your health care provider.  Take your diabetes medicine or insulin as directed by your health care provider to maintain your blood glucose levels in the desired range.  Never run out of diabetes medicine or insulin. It is needed every day.  If you are using insulin, you may need to adjust the amount of insulin given based on your intake   of carbohydrates. Carbohydrates can raise blood glucose levels but need to be included in your diet. Carbohydrates provide vitamins, minerals, and fiber which are an essential part of a healthy diet. Carbohydrates are found in fruits, vegetables, whole grains, dairy products, legumes, and foods containing added sugars.  Eat healthy foods. You should make an  appointment to see a registered dietitian to help you create an eating plan that is right for you.  Lose weight if overweight.  Carry a medical alert card or wear your medical alert jewelry.  Carry a 15 gram carbohydrate snack with you at all times to treat low blood glucose (hypoglycemia). Some examples of 15 gram carbohydrate snacks include:  Glucose tablets, 3 or 4  Raisins, 2 tablespoons (24 grams)  Jelly beans, 6  Animal crackers, 8  Regular pop, 4 ounces (120 mL)  Gummy treats, 9  Recognize hypoglycemia. Hypoglycemia occurs with blood glucose levels of 70 mg/dL and below. The risk for hypoglycemia increases when fasting or skipping meals, during or after intense exercise, and during sleep. Hypoglycemia symptoms can include:  Tremors or shakes.  Decreased ability to concentrate.  Sweating.  Increased heart rate.  Headache.  Dry mouth.  Hunger.  Irritability.  Anxiety.  Restless sleep.  Altered speech or coordination.  Confusion.  Treat hypoglycemia promptly. If you are alert and able to safely swallow, follow the 15:15 rule:  Take 15-20 grams of rapid-acting glucose or carbohydrate. Rapid-acting options include glucose gel, glucose tablets, or 4 ounces (120 mL) of fruit juice, regular soda, or low fat milk.  Check your blood glucose level 15 minutes after taking the glucose.  Take 15-20 grams more of glucose if the repeat blood glucose level is still 70 mg/dL or below.  Eat a meal or snack within 1 hour once blood glucose levels return to normal.  Be alert to feeling very thirsty and urinating more frequently than usual, which are early signs of hyperglycemia. An early awareness of hyperglycemia allows for prompt treatment. Treat hyperglycemia as directed by your health care provider.  Engage in at least 150 minutes of moderate-intensity physical activity a week, spread over at least 3 days of the week or as directed by your health care provider. In  addition, you should engage in resistance exercise at least 2 times a week or as directed by your health care provider.  Adjust your medicine and food intake as needed if you start a new exercise or sport.  Follow your sick day plan at any time you are unable to eat or drink as usual.  Avoid tobacco use.  Limit alcohol intake to no more than 1 drink per day for nonpregnant women and 2 drinks per day for men. You should drink alcohol only when you are also eating food. Talk with your health care provider whether alcohol is safe for you. Tell your health care provider if you drink alcohol several times a week.  Follow up with your health care provider regularly.  Schedule an eye exam soon after the diagnosis of type 2 diabetes and then annually.  Perform daily skin and foot care. Examine your skin and feet daily for cuts, bruises, redness, nail problems, bleeding, blisters, or sores. A foot exam by a health care provider should be done annually.  Brush your teeth and gums at least twice a day and floss at least once a day. Follow up with your dentist regularly.  Share your diabetes management plan with your workplace or school.  Stay up-to-date with   immunizations.  Learn to manage stress.  Obtain ongoing diabetes education and support as needed.  Participate in, or seek rehabilitation as needed to maintain or improve independence and quality of life. Request a physical or occupational therapy referral if you are having foot or hand numbness or difficulties with grooming, dressing, eating, or physical activity. SEEK MEDICAL CARE IF:   You are unable to eat food or drink fluids for more than 6 hours.  You have nausea and vomiting for more than 6 hours.  Your blood glucose level is over 240 mg/dL.  There is a change in mental status.  You develop an additional serious illness.  You have diarrhea for more than 6 hours.  You have been sick or have had a fever for a couple of days  and are not getting better.  You have pain during any physical activity.  SEEK IMMEDIATE MEDICAL CARE IF:  You have difficulty breathing.  You have moderate to large ketone levels. MAKE SURE YOU:  Understand these instructions.  Will watch your condition.  Will get help right away if you are not doing well or get worse. Document Released: 07/12/2005 Document Revised: 07/17/2013 Document Reviewed: 02/08/2012 [employer] Patient Information 2015 [employer]. This information is not intended to replace advice given to you by your health care provider. Make sure you discuss any questions you have with your health care provider.  

## 2014-01-11 NOTE — Progress Notes (Signed)
Pre visit review using our clinic review tool, if applicable. No additional management support is needed unless otherwise documented below in the visit note. 

## 2014-01-12 ENCOUNTER — Telehealth: Payer: Self-pay | Admitting: Internal Medicine

## 2014-01-12 NOTE — Telephone Encounter (Signed)
Relevant patient education assigned to patient using Emmi. ° °

## 2014-01-13 DIAGNOSIS — D519 Vitamin B12 deficiency anemia, unspecified: Secondary | ICD-10-CM | POA: Insufficient documentation

## 2014-01-13 MED ORDER — CYANOCOBALAMIN 500 MCG/0.1ML NA SOLN: 0.1000 mL | NASAL | Status: DC

## 2014-01-13 NOTE — Assessment & Plan Note (Signed)
I will recheck her UDS today and will advise further if needed

## 2014-01-13 NOTE — Assessment & Plan Note (Signed)
Her blood sugars are well controlled 

## 2014-01-13 NOTE — Progress Notes (Signed)
Subjective:    Patient ID: Meghan Park, female    DOB: Jan 15, 1971, 43 y.o.   MRN: [id number]  HPI Comments: She returns and requests a repeat UDS be done. Her last one was positive for EtOH but she tells me that she has not had any EtOH for many years.  Diabetes Hypoglycemia symptoms include dizziness and nervousness/anxiousness. Pertinent negatives for hypoglycemia include no confusion. Associated symptoms include fatigue. Pertinent negatives for diabetes include no chest pain, no polydipsia, no polyphagia and no polyuria.  Anemia There has been no abdominal pain, confusion, fever or palpitations.      Review of Systems  Constitutional: Positive for fatigue. Negative for fever, chills, diaphoresis, activity change, appetite change and unexpected weight change.  HENT: Negative.  Negative for congestion and trouble swallowing.   Eyes: Negative.   Respiratory: Negative.  Negative for apnea, cough, choking, chest tightness, shortness of breath, wheezing and stridor.   Cardiovascular: Negative.  Negative for chest pain, palpitations and leg swelling.  Gastrointestinal: Negative.  Negative for nausea, vomiting, abdominal pain, diarrhea and constipation.  Endocrine: Negative.  Negative for polydipsia, polyphagia and polyuria.  Genitourinary: Negative.   Musculoskeletal: Negative.  Negative for arthralgias, back pain, myalgias and neck pain.  Skin: Negative.  Negative for rash.  Allergic/Immunologic: Negative.   Neurological: Positive for dizziness.  Hematological: Negative.  Negative for adenopathy.  Psychiatric/Behavioral: Positive for dysphoric mood. Negative for suicidal ideas, hallucinations, behavioral problems, confusion, sleep disturbance, self-injury, decreased concentration and agitation. The patient is nervous/anxious. The patient is not hyperactive.        Objective:   Physical Exam  Vitals reviewed. Constitutional: She is oriented to person, place, and time. She appears  well-developed and well-nourished. No distress.  HENT:  Head: Normocephalic and atraumatic.  Mouth/Throat: Oropharynx is clear and moist. No oropharyngeal exudate.  Eyes: Conjunctivae are normal. Right eye exhibits no discharge. Left eye exhibits no discharge. No scleral icterus.  Neck: Normal range of motion. Neck supple. No JVD present. No tracheal deviation present. No thyromegaly present.  Cardiovascular: Normal rate, regular rhythm, normal heart sounds and intact distal pulses.  Exam reveals no gallop and no friction rub.   No murmur heard. Pulmonary/Chest: Effort normal and breath sounds normal. No stridor. No respiratory distress. She has no wheezes. She has no rales. She exhibits no tenderness.  Abdominal: Soft. Bowel sounds are normal. She exhibits no distension and no mass. There is no tenderness. There is no rebound and no guarding.  Musculoskeletal: Normal range of motion. She exhibits no edema and no tenderness.  Lymphadenopathy:    She has no cervical adenopathy.  Neurological: She is oriented to person, place, and time.  Skin: Skin is warm and dry. No rash noted. She is not diaphoretic. No erythema. No pallor.  Psychiatric: She has a normal mood and affect. Her behavior is normal. Judgment and thought content normal.    Lab Results  Component Value Date   WBC 10.3 12/21/2013   HGB 11.6* 12/21/2013   HCT 34.2* 12/21/2013   PLT 263.0 12/21/2013   GLUCOSE 120* 12/21/2013   CHOL 179 04/06/2013   TRIG 149.0 04/06/2013   HDL 42.50 04/06/2013   LDLCALC 107* 04/06/2013   ALT 20 04/06/2013   AST 31 04/06/2013   NA 134* 12/21/2013   K 4.0 12/21/2013   CL 100 12/21/2013   CREATININE 0.8 12/21/2013   BUN 8 12/21/2013   CO2 26 12/21/2013   TSH 1.28 12/21/2013   INR 1.18 04/22/2012  HGBA1C 7.1* 12/21/2013   MICROALBUR 0.6 04/06/2013        Assessment & Plan:

## 2014-01-13 NOTE — Assessment & Plan Note (Signed)
Will start ferralet and nascobal ns

## 2014-01-13 NOTE — Assessment & Plan Note (Signed)
ferralet has been started

## 2014-01-15 ENCOUNTER — Encounter: Payer: Self-pay | Admitting: Internal Medicine

## 2014-01-15 DIAGNOSIS — R300 Dysuria: Secondary | ICD-10-CM

## 2014-01-16 NOTE — Telephone Encounter (Signed)
Spoke with patient and clarified that nascobal is for b12 deficiency. While speaking with pt, she c/o abdominal, back pain and urinary frequency. I advised that MD is out of the office and ordered a U/A for one of the other MD to review.

## 2014-01-17 ENCOUNTER — Other Ambulatory Visit (INDEPENDENT_AMBULATORY_CARE_PROVIDER_SITE_OTHER): Payer: Managed Care, Other (non HMO)

## 2014-01-17 DIAGNOSIS — R300 Dysuria: Secondary | ICD-10-CM

## 2014-01-17 LAB — URINALYSIS, ROUTINE W REFLEX MICROSCOPIC
Bilirubin Urine: NEGATIVE
HGB URINE DIPSTICK: NEGATIVE
Ketones, ur: NEGATIVE
LEUKOCYTES UA: NEGATIVE
NITRITE: NEGATIVE
Total Protein, Urine: NEGATIVE
UROBILINOGEN UA: 0.2 (ref 0.0–1.0)
Urine Glucose: >=1000 — AB
pH: 6 (ref 5.0–8.0)

## 2014-01-18 ENCOUNTER — Encounter: Payer: Self-pay | Admitting: Internal Medicine

## 2014-01-19 ENCOUNTER — Encounter: Payer: Self-pay | Admitting: Internal Medicine

## 2014-01-30 ENCOUNTER — Encounter: Payer: Self-pay | Admitting: Internal Medicine

## 2014-01-31 ENCOUNTER — Ambulatory Visit: Payer: Managed Care, Other (non HMO) | Admitting: Dietician

## 2014-02-11 ENCOUNTER — Telehealth: Payer: Self-pay | Admitting: *Deleted

## 2014-02-11 NOTE — Telephone Encounter (Signed)
Pt called and stated she has been having on her R side abd area starting on Thurs. Pt denies fever or nausea. Inform pt she need to be evaluated no appts avail today, but can see if our other practices can see her. Pt is wanting to come here. Made appt for tomorrow with Dr. Posey ReaPlotnikov did inform her if pain get worse to go to ER to be evaluated...Raechel Chute/lmb

## 2014-02-12 ENCOUNTER — Telehealth: Payer: Self-pay

## 2014-02-12 ENCOUNTER — Ambulatory Visit (INDEPENDENT_AMBULATORY_CARE_PROVIDER_SITE_OTHER): Payer: Managed Care, Other (non HMO) | Admitting: Internal Medicine

## 2014-02-12 ENCOUNTER — Ambulatory Visit: Payer: Managed Care, Other (non HMO) | Admitting: Internal Medicine

## 2014-02-12 ENCOUNTER — Other Ambulatory Visit (INDEPENDENT_AMBULATORY_CARE_PROVIDER_SITE_OTHER): Payer: Managed Care, Other (non HMO)

## 2014-02-12 ENCOUNTER — Encounter: Payer: Self-pay | Admitting: Internal Medicine

## 2014-02-12 ENCOUNTER — Ambulatory Visit (INDEPENDENT_AMBULATORY_CARE_PROVIDER_SITE_OTHER)
Admission: RE | Admit: 2014-02-12 | Discharge: 2014-02-12 | Disposition: A | Discharge status: Home / Self Care | Payer: Managed Care, Other (non HMO) | Source: Ambulatory Visit | Attending: Internal Medicine | Admitting: Internal Medicine

## 2014-02-12 VITALS — BP 120/80 | HR 104 | Temp 98.4°F | Ht 62.0 in | Wt 194.8 lb

## 2014-02-12 DIAGNOSIS — R102 Pelvic and perineal pain unspecified side: Secondary | ICD-10-CM | POA: Insufficient documentation

## 2014-02-12 DIAGNOSIS — N10 Acute tubulo-interstitial nephritis: Secondary | ICD-10-CM | POA: Insufficient documentation

## 2014-02-12 DIAGNOSIS — R1031 Right lower quadrant pain: Secondary | ICD-10-CM

## 2014-02-12 DIAGNOSIS — R109 Unspecified abdominal pain: Secondary | ICD-10-CM

## 2014-02-12 DIAGNOSIS — D509 Iron deficiency anemia, unspecified: Secondary | ICD-10-CM

## 2014-02-12 DIAGNOSIS — IMO0002 Reserved for concepts with insufficient information to code with codable children: Secondary | ICD-10-CM

## 2014-02-12 DIAGNOSIS — E1165 Type 2 diabetes mellitus with hyperglycemia: Secondary | ICD-10-CM

## 2014-02-12 DIAGNOSIS — IMO0001 Reserved for inherently not codable concepts without codable children: Secondary | ICD-10-CM

## 2014-02-12 LAB — URINALYSIS
BILIRUBIN URINE: NEGATIVE
Hgb urine dipstick: NEGATIVE
KETONES UR: NEGATIVE
LEUKOCYTES UA: NEGATIVE
NITRITE: NEGATIVE
PH: 5.5 (ref 5.0–8.0)
Specific Gravity, Urine: <=1.005 — AB (ref 1.000–1.030)
Total Protein, Urine: NEGATIVE
UROBILINOGEN UA: 0.2 (ref 0.0–1.0)
Urine Glucose: >=1000 — AB

## 2014-02-12 LAB — BASIC METABOLIC PANEL
BUN: 8 mg/dL (ref 6–23)
CALCIUM: 9 mg/dL (ref 8.4–10.5)
CO2: 28 mEq/L (ref 19–32)
Chloride: 104 mEq/L (ref 96–112)
Creatinine, Ser: 0.7 mg/dL (ref 0.4–1.2)
GFR: 97.06 mL/min (ref >60.00)
GLUCOSE: 104 mg/dL — AB (ref 70–99)
POTASSIUM: 3.8 meq/L (ref 3.5–5.1)
Sodium: 138 mEq/L (ref 135–145)

## 2014-02-12 LAB — CBC WITH DIFFERENTIAL/PLATELET
BASOS ABS: 0 10*3/uL (ref 0.0–0.1)
Basophils Relative: 0.3 % (ref 0.0–3.0)
Eosinophils Absolute: 0.1 10*3/uL (ref 0.0–0.7)
Eosinophils Relative: 1.7 % (ref 0.0–5.0)
HCT: 36.3 % (ref 36.0–46.0)
HEMOGLOBIN: 12.1 g/dL (ref 12.0–15.0)
LYMPHS PCT: 28 % (ref 12.0–46.0)
Lymphs Abs: 2.4 10*3/uL (ref 0.7–4.0)
MCHC: 33.5 g/dL (ref 30.0–36.0)
MCV: 85.2 fl (ref 78.0–100.0)
MONOS PCT: 5.6 % (ref 3.0–12.0)
Monocytes Absolute: 0.5 10*3/uL (ref 0.1–1.0)
NEUTROS PCT: 64.4 % (ref 43.0–77.0)
Neutro Abs: 5.5 10*3/uL (ref 1.4–7.7)
Platelets: 234 10*3/uL (ref 150.0–400.0)
RBC: 4.26 Mil/uL (ref 3.87–5.11)
RDW: 18.5 % — AB (ref 11.5–15.5)
WBC: 8.6 10*3/uL (ref 4.0–10.5)

## 2014-02-12 LAB — HEPATIC FUNCTION PANEL
ALT: 14 U/L (ref 0–35)
AST: 17 U/L (ref 0–37)
Albumin: 3.8 g/dL (ref 3.5–5.2)
Alkaline Phosphatase: 66 U/L (ref 39–117)
Bilirubin, Direct: 0.1 mg/dL (ref 0.0–0.3)
TOTAL PROTEIN: 7.3 g/dL (ref 6.0–8.3)
Total Bilirubin: 0.6 mg/dL (ref 0.2–1.2)

## 2014-02-12 LAB — SEDIMENTATION RATE: Sed Rate: 43 mm/hr — ABNORMAL HIGH (ref 0–22)

## 2014-02-12 MED ORDER — CIPROFLOXACIN HCL 500 MG PO TABS: 500.0000 mg | ORAL_TABLET | Freq: Two times a day (BID) | ORAL | Status: DC

## 2014-02-12 MED ORDER — IOHEXOL 300 MG/ML  SOLN
100.0000 mL | Freq: Once | INTRAMUSCULAR | Status: AC | PRN
  Administered 2014-02-12: 100 mL via INTRAVENOUS

## 2014-02-12 NOTE — Assessment & Plan Note (Addendum)
7/15 new x4-5 d Labs stat abd CT stat Refused ER due to high co-pay.  She will go to ER if it looks like CT will not take place this am.

## 2014-02-12 NOTE — Progress Notes (Signed)
   Subjective:    Patient ID: Meghan Park, female    DOB: 07-Sep-1970, 43 y.o.   MRN: [id number]  Abdominal Pain This is a new problem. The current episode started in the past 7 days. The onset quality is gradual. The problem occurs intermittently. Duration: 10 min. The problem has been gradually worsening. The pain is located in the RLQ and right flank. The pain is moderate. The quality of the pain is dull. The abdominal pain radiates to the back. Associated symptoms include diarrhea and nausea. Pertinent negatives include no constipation, dysuria, fever, frequency, hematochezia, hematuria or melena. The pain is aggravated by movement. The pain is relieved by being still. She has tried acetaminophen (advil) for the symptoms. The treatment provided no relief.  Not toxic appearing CBG ws 136 this am   Review of Systems  Constitutional: Positive for chills and fatigue. Negative for fever.  Gastrointestinal: Positive for nausea, abdominal pain and diarrhea. Negative for constipation, melena and hematochezia.  Genitourinary: Negative for dysuria, frequency and hematuria.  Psychiatric/Behavioral: Negative for confusion and sleep disturbance.       Objective:   Physical Exam  Constitutional: She appears well-developed. No distress.  Obese  HENT:  Head: Normocephalic.  Right Ear: External ear normal.  Left Ear: External ear normal.  Nose: Nose normal.  Mouth/Throat: Oropharynx is clear and moist.  Eyes: Conjunctivae are normal. Pupils are equal, round, and reactive to light. Right eye exhibits no discharge. Left eye exhibits no discharge.  Neck: Normal range of motion. Neck supple. No JVD present. No tracheal deviation present. No thyromegaly present.  Cardiovascular: Normal rate, regular rhythm and normal heart sounds.   Pulmonary/Chest: No stridor. No respiratory distress. She has no wheezes.  Abdominal: Soft. Bowel sounds are normal. She exhibits no distension and no mass. There is  tenderness. There is rebound (RLQ +/-) and guarding (+/-).  Musculoskeletal: She exhibits no edema and no tenderness.  Lymphadenopathy:    She has no cervical adenopathy.  Neurological: She displays normal reflexes. No cranial nerve deficit. She exhibits normal muscle tone. Coordination normal.  Skin: No rash noted. No erythema.  Psychiatric: She has a normal mood and affect. Her behavior is normal. Judgment and thought content normal.   Lab Results  Component Value Date   WBC 10.3 12/21/2013   HGB 11.6* 12/21/2013   HCT 34.2* 12/21/2013   PLT 263.0 12/21/2013   GLUCOSE 120* 12/21/2013   CHOL 179 04/06/2013   TRIG 149.0 04/06/2013   HDL 42.50 04/06/2013   LDLCALC 107* 04/06/2013   ALT 20 04/06/2013   AST 31 04/06/2013   NA 134* 12/21/2013   K 4.0 12/21/2013   CL 100 12/21/2013   CREATININE 0.8 12/21/2013   BUN 8 12/21/2013   CO2 26 12/21/2013   TSH 1.28 12/21/2013   INR 1.18 04/22/2012   HGBA1C 7.1* 12/21/2013   MICROALBUR 0.6 04/06/2013          Assessment & Plan:

## 2014-02-12 NOTE — Assessment & Plan Note (Signed)
As above.

## 2014-02-12 NOTE — Telephone Encounter (Signed)
Patient notified og CT results per MD, also will pick up Rx for Cipro

## 2014-02-12 NOTE — Assessment & Plan Note (Signed)
CBC

## 2014-02-12 NOTE — Patient Instructions (Addendum)
Go to ER if worse! Do not eat until we know CT results - OK sips of water

## 2014-02-12 NOTE — Assessment & Plan Note (Signed)
CBGs are ok lately

## 2014-02-12 NOTE — Progress Notes (Signed)
Pre visit review using our clinic review tool, if applicable. No additional management support is needed unless otherwise documented below in the visit note. 

## 2014-02-13 ENCOUNTER — Encounter: Payer: Self-pay | Admitting: Internal Medicine

## 2014-02-13 DIAGNOSIS — R110 Nausea: Secondary | ICD-10-CM

## 2014-02-13 MED ORDER — ONDANSETRON 8 MG PO TBDP: 8.0000 mg | ORAL_TABLET | Freq: Three times a day (TID) | ORAL | Status: DC | PRN

## 2014-02-27 ENCOUNTER — Encounter: Payer: Self-pay | Admitting: Internal Medicine

## 2014-02-27 ENCOUNTER — Other Ambulatory Visit (INDEPENDENT_AMBULATORY_CARE_PROVIDER_SITE_OTHER): Payer: Managed Care, Other (non HMO)

## 2014-02-27 ENCOUNTER — Ambulatory Visit (INDEPENDENT_AMBULATORY_CARE_PROVIDER_SITE_OTHER): Payer: Managed Care, Other (non HMO) | Admitting: Internal Medicine

## 2014-02-27 VITALS — BP 113/75 | HR 96 | Temp 97.9°F | Resp 16 | Ht 62.0 in | Wt 195.0 lb

## 2014-02-27 DIAGNOSIS — N949 Unspecified condition associated with female genital organs and menstrual cycle: Secondary | ICD-10-CM

## 2014-02-27 DIAGNOSIS — IMO0002 Reserved for concepts with insufficient information to code with codable children: Secondary | ICD-10-CM

## 2014-02-27 DIAGNOSIS — N10 Acute tubulo-interstitial nephritis: Secondary | ICD-10-CM

## 2014-02-27 DIAGNOSIS — IMO0001 Reserved for inherently not codable concepts without codable children: Secondary | ICD-10-CM

## 2014-02-27 DIAGNOSIS — E1165 Type 2 diabetes mellitus with hyperglycemia: Secondary | ICD-10-CM

## 2014-02-27 DIAGNOSIS — R102 Pelvic and perineal pain: Secondary | ICD-10-CM

## 2014-02-27 DIAGNOSIS — R935 Abnormal findings on diagnostic imaging of other abdominal regions, including retroperitoneum: Secondary | ICD-10-CM | POA: Insufficient documentation

## 2014-02-27 LAB — URINALYSIS, ROUTINE W REFLEX MICROSCOPIC
BILIRUBIN URINE: NEGATIVE
Hgb urine dipstick: NEGATIVE
KETONES UR: NEGATIVE
Leukocytes, UA: NEGATIVE
NITRITE: NEGATIVE
PH: 6 (ref 5.0–8.0)
RBC / HPF: NONE SEEN (ref 0–?)
Specific Gravity, Urine: <=1.005 — AB (ref 1.000–1.030)
TOTAL PROTEIN, URINE-UPE24: NEGATIVE
Urobilinogen, UA: 0.2 (ref 0.0–1.0)

## 2014-02-27 MED ORDER — FLUCONAZOLE 200 MG PO TABS: 200.0000 mg | ORAL_TABLET | Freq: Every day | ORAL | Status: AC

## 2014-02-27 NOTE — Progress Notes (Signed)
Subjective:    Patient ID: Meghan Park, female    DOB: 1970-09-07, 43 y.o.   MRN: [id number]  Abdominal Pain This is a recurrent problem. The current episode started 1 to 4 weeks ago. The onset quality is gradual. The problem occurs intermittently. The problem has been unchanged. The pain is located in the RLQ and suprapubic region. The pain is at a severity of 2/10. The pain is mild. The quality of the pain is aching. The abdominal pain does not radiate. Associated symptoms include dysuria. Pertinent negatives include no anorexia, arthralgias, constipation, diarrhea, fever, flatus, frequency, headaches, hematochezia, hematuria, melena, myalgias, nausea, vomiting or weight loss. Nothing aggravates the pain. She has tried nothing for the symptoms. The treatment provided mild relief. Prior diagnostic workup includes CT scan. There is no history of abdominal surgery, colon cancer, Crohn's disease, gallstones, GERD, irritable bowel syndrome, pancreatitis, PUD or ulcerative colitis.      Review of Systems  Constitutional: Negative.  Negative for fever, chills, weight loss, diaphoresis, appetite change and fatigue.  HENT: Negative.   Eyes: Negative.   Respiratory: Negative.  Negative for cough, choking, chest tightness, shortness of breath and stridor.   Cardiovascular: Negative.  Negative for chest pain, palpitations and leg swelling.  Gastrointestinal: Positive for abdominal pain. Negative for nausea, vomiting, diarrhea, constipation, blood in stool, melena, hematochezia, abdominal distention, anal bleeding, rectal pain, anorexia and flatus.  Endocrine: Negative.   Genitourinary: Positive for dysuria and pelvic pain. Negative for urgency, frequency, hematuria, flank pain, decreased urine volume, vaginal bleeding, vaginal discharge, enuresis, difficulty urinating, vaginal pain, menstrual problem and dyspareunia.  Musculoskeletal: Negative.  Negative for arthralgias, back pain and myalgias.  Skin:  Negative.  Negative for rash.  Allergic/Immunologic: Negative.   Neurological: Negative.  Negative for headaches.  Hematological: Negative.  Negative for adenopathy. Does not bruise/bleed easily.  Psychiatric/Behavioral: Negative.        Objective:   Physical Exam  Vitals reviewed. Constitutional: She is oriented to person, place, and time. She appears well-developed and well-nourished.  Non-toxic appearance. She does not have a sickly appearance. She does not appear ill. No distress.  HENT:  Head: Normocephalic and atraumatic.  Mouth/Throat: Oropharynx is clear and moist. No oropharyngeal exudate.  Eyes: Conjunctivae are normal. Right eye exhibits no discharge. Left eye exhibits no discharge. No scleral icterus.  Neck: Normal range of motion. Neck supple. No JVD present. No tracheal deviation present. No thyromegaly present.  Cardiovascular: Normal rate, regular rhythm, normal heart sounds and intact distal pulses.  Exam reveals no gallop and no friction rub.   No murmur heard. Pulmonary/Chest: Effort normal and breath sounds normal. No stridor. No respiratory distress. She has no wheezes. She has no rales. She exhibits no tenderness.  Abdominal: Soft. Normal appearance and bowel sounds are normal. She exhibits no shifting dullness, no distension, no pulsatile liver, no fluid wave, no abdominal bruit, no ascites, no pulsatile midline mass and no mass. There is no hepatosplenomegaly, splenomegaly or hepatomegaly. There is tenderness in the right lower quadrant and suprapubic area. There is no rebound, no guarding, no CVA tenderness, no tenderness at McBurney's point and negative Murphy's sign. No hernia. Hernia confirmed negative in the ventral area, confirmed negative in the right inguinal area and confirmed negative in the left inguinal area.  Musculoskeletal: Normal range of motion. She exhibits no edema and no tenderness.  Lymphadenopathy:    She has no cervical adenopathy.  Neurological:  She is oriented to person, place, and time.  Skin: Skin is warm and dry. No rash noted. She is not diaphoretic. No erythema. No pallor.  Psychiatric: She has a normal mood and affect. Her behavior is normal. Judgment and thought content normal.     Lab Results  Component Value Date   WBC 8.6 02/12/2014   HGB 12.1 02/12/2014   HCT 36.3 02/12/2014   PLT 234.0 02/12/2014   GLUCOSE 104* 02/12/2014   CHOL 179 04/06/2013   TRIG 149.0 04/06/2013   HDL 42.50 04/06/2013   LDLCALC 107* 04/06/2013   ALT 14 02/12/2014   AST 17 02/12/2014   NA 138 02/12/2014   K 3.8 02/12/2014   CL 104 02/12/2014   CREATININE 0.7 02/12/2014   BUN 8 02/12/2014   CO2 28 02/12/2014   TSH 1.28 12/21/2013   INR 1.18 04/22/2012   HGBA1C 7.1* 12/21/2013   MICROALBUR 0.6 04/06/2013       Assessment & Plan:

## 2014-02-27 NOTE — Assessment & Plan Note (Signed)
There is yeast in her urine so will start diflucan Await urine culture results as well

## 2014-02-27 NOTE — Progress Notes (Signed)
Pre visit review using our clinic review tool, if applicable. No additional management support is needed unless otherwise documented below in the visit note. 

## 2014-02-27 NOTE — Assessment & Plan Note (Signed)
Will check a pelvic U/S to look at her ovaries and bladder

## 2014-02-27 NOTE — Assessment & Plan Note (Signed)
She has yeast in her urine so I have asked her to stop farxiga

## 2014-02-27 NOTE — Assessment & Plan Note (Signed)
U/S ordered to look at the abnormal areas noted on the CT scan

## 2014-02-27 NOTE — Patient Instructions (Signed)
Pyelonephritis, Adult °Pyelonephritis is a kidney infection. In general, there are 2 main types of pyelonephritis: °· Infections that come on quickly without any warning (acute pyelonephritis). °· Infections that persist for a long period of time (chronic pyelonephritis). °CAUSES  °Two main causes of pyelonephritis are: °· Bacteria traveling from the bladder to the kidney. This is a problem especially in pregnant women. The urine in the bladder can become filled with bacteria from multiple causes, including: °¨ Inflammation of the prostate gland (prostatitis). °¨ Sexual intercourse in females. °¨ Bladder infection (cystitis). °· Bacteria traveling from the bloodstream to the tissue part of the kidney. °Problems that may increase your risk of getting a kidney infection include: °· Diabetes. °· Kidney stones or bladder stones. °· Cancer. °· Catheters placed in the bladder. °· Other abnormalities of the kidney or ureter. °SYMPTOMS  °· Abdominal pain. °· Pain in the side or flank area. °· Fever. °· Chills. °· Upset stomach. °· Blood in the urine (dark urine). °· Frequent urination. °· Strong or persistent urge to urinate. °· Burning or stinging when urinating. °DIAGNOSIS  °Your caregiver may diagnose your kidney infection based on your symptoms. A urine sample may also be taken. °TREATMENT  °In general, treatment depends on how severe the infection is.  °· If the infection is mild and caught early, your caregiver may treat you with oral antibiotics and send you home. °· If the infection is more severe, the bacteria may have gotten into the bloodstream. This will require intravenous (IV) antibiotics and a hospital stay. Symptoms may include: °¨ High fever. °¨ Severe flank pain. °¨ Shaking chills. °· Even after a hospital stay, your caregiver may require you to be on oral antibiotics for a period of time. °· Other treatments may be required depending upon the cause of the infection. °HOME CARE INSTRUCTIONS  °· Take your  antibiotics as directed. Finish them even if you start to feel better. °· Make an appointment to have your urine checked to make sure the infection is gone. °· Drink enough fluids to keep your urine clear or pale yellow. °· Take medicines for the bladder if you have urgency and frequency of urination as directed by your caregiver. °SEEK IMMEDIATE MEDICAL CARE IF:  °· You have a fever or persistent symptoms for more than 2-3 days. °· You have a fever and your symptoms suddenly get worse. °· You are unable to take your antibiotics or fluids. °· You develop shaking chills. °· You experience extreme weakness or fainting. °· There is no improvement after 2 days of treatment. °MAKE SURE YOU: °· Understand these instructions. °· Will watch your condition. °· Will get help right away if you are not doing well or get worse. °Document Released: 07/12/2005 Document Revised: 01/11/2012 Document Reviewed: 12/16/2010 °[employer]® Patient Information ©2015 [employer]. This information is not intended to replace advice given to you by your health care provider. Make sure you discuss any questions you have with your health care provider. ° °

## 2014-02-28 ENCOUNTER — Other Ambulatory Visit: Payer: Self-pay | Admitting: Internal Medicine

## 2014-02-28 ENCOUNTER — Encounter: Payer: Self-pay | Admitting: Internal Medicine

## 2014-02-28 DIAGNOSIS — R102 Pelvic and perineal pain: Secondary | ICD-10-CM

## 2014-02-28 DIAGNOSIS — R935 Abnormal findings on diagnostic imaging of other abdominal regions, including retroperitoneum: Secondary | ICD-10-CM

## 2014-03-02 ENCOUNTER — Encounter: Payer: Self-pay | Admitting: Internal Medicine

## 2014-03-02 LAB — CULTURE, URINE COMPREHENSIVE

## 2014-03-05 ENCOUNTER — Other Ambulatory Visit: Payer: Self-pay | Admitting: Internal Medicine

## 2014-03-05 DIAGNOSIS — R102 Pelvic and perineal pain: Secondary | ICD-10-CM

## 2014-03-05 MED ORDER — TRAMADOL HCL 50 MG PO TABS: 50.0000 mg | ORAL_TABLET | Freq: Three times a day (TID) | ORAL | Status: DC | PRN

## 2014-03-06 ENCOUNTER — Other Ambulatory Visit: Payer: Self-pay | Admitting: Internal Medicine

## 2014-03-06 DIAGNOSIS — R102 Pelvic and perineal pain: Secondary | ICD-10-CM

## 2014-03-08 ENCOUNTER — Ambulatory Visit
Admission: RE | Admit: 2014-03-08 | Discharge: 2014-03-08 | Disposition: A | Discharge status: Home / Self Care | Payer: Managed Care, Other (non HMO) | Source: Ambulatory Visit | Attending: Internal Medicine | Admitting: Internal Medicine

## 2014-03-08 ENCOUNTER — Encounter: Payer: Self-pay | Admitting: Internal Medicine

## 2014-03-08 DIAGNOSIS — R935 Abnormal findings on diagnostic imaging of other abdominal regions, including retroperitoneum: Secondary | ICD-10-CM

## 2014-03-08 DIAGNOSIS — R102 Pelvic and perineal pain: Secondary | ICD-10-CM

## 2014-03-22 ENCOUNTER — Telehealth: Payer: Self-pay | Admitting: Internal Medicine

## 2014-03-22 NOTE — Telephone Encounter (Signed)
Pt has an appt with Dr.John on 03/26/14 for this concern

## 2014-03-22 NOTE — Telephone Encounter (Signed)
Yes, please call pt to schedule OV with PCP.

## 2014-03-22 NOTE — Telephone Encounter (Signed)
Pt called in wanted to see if she needed to come in and get her iron checked.  She has been falling asleep and felling extremely tired.

## 2014-03-23 ENCOUNTER — Ambulatory Visit (INDEPENDENT_AMBULATORY_CARE_PROVIDER_SITE_OTHER): Payer: Managed Care, Other (non HMO) | Admitting: Family Medicine

## 2014-03-23 VITALS — BP 118/78 | HR 80 | Temp 99.1°F | Resp 16 | Ht 63.0 in | Wt 191.2 lb

## 2014-03-23 DIAGNOSIS — R05 Cough: Secondary | ICD-10-CM

## 2014-03-23 DIAGNOSIS — R5383 Other fatigue: Secondary | ICD-10-CM

## 2014-03-23 DIAGNOSIS — R1030 Lower abdominal pain, unspecified: Secondary | ICD-10-CM

## 2014-03-23 DIAGNOSIS — D519 Vitamin B12 deficiency anemia, unspecified: Secondary | ICD-10-CM

## 2014-03-23 DIAGNOSIS — D518 Other vitamin B12 deficiency anemias: Secondary | ICD-10-CM

## 2014-03-23 DIAGNOSIS — R059 Cough, unspecified: Secondary | ICD-10-CM

## 2014-03-23 DIAGNOSIS — E119 Type 2 diabetes mellitus without complications: Secondary | ICD-10-CM

## 2014-03-23 DIAGNOSIS — R109 Unspecified abdominal pain: Secondary | ICD-10-CM

## 2014-03-23 DIAGNOSIS — R5381 Other malaise: Secondary | ICD-10-CM

## 2014-03-23 LAB — POCT CBC
GRANULOCYTE PERCENT: 57.2 % (ref 37–80)
HCT, POC: 37.4 % — AB (ref 37.7–47.9)
Hemoglobin: 12.4 g/dL (ref 12.2–16.2)
Lymph, poc: 2.7 (ref 0.6–3.4)
MCH: 29.4 pg (ref 27–31.2)
MCHC: 33 g/dL (ref 31.8–35.4)
MCV: 89.1 fL (ref 80–97)
MID (cbc): 0.6 (ref 0–0.9)
MPV: 6.7 fL (ref 0–99.8)
POC Granulocyte: 4.5 (ref 2–6.9)
POC LYMPH PERCENT: 35.2 %L (ref 10–50)
POC MID %: 7.6 % (ref 0–12)
Platelet Count, POC: 235 10*3/uL (ref 142–424)
RBC: 4.2 M/uL (ref 4.04–5.48)
RDW, POC: 16.6 %
WBC: 7.8 10*3/uL (ref 4.6–10.2)

## 2014-03-23 LAB — POCT URINALYSIS DIPSTICK
Bilirubin, UA: NEGATIVE
Blood, UA: NEGATIVE
Glucose, UA: NEGATIVE
Ketones, UA: NEGATIVE
Leukocytes, UA: NEGATIVE
NITRITE UA: NEGATIVE
PROTEIN UA: NEGATIVE
Urobilinogen, UA: 0.2
pH, UA: 5.5

## 2014-03-23 LAB — POCT UA - MICROSCOPIC ONLY
Bacteria, U Microscopic: NEGATIVE
Casts, Ur, LPF, POC: NEGATIVE
Crystals, Ur, HPF, POC: NEGATIVE
Mucus, UA: NEGATIVE
RBC, urine, microscopic: NEGATIVE
WBC, UR, HPF, POC: NEGATIVE
YEAST UA: NEGATIVE

## 2014-03-23 LAB — BASIC METABOLIC PANEL
BUN: 5 mg/dL — ABNORMAL LOW (ref 6–23)
CALCIUM: 8.8 mg/dL (ref 8.4–10.5)
CO2: 26 mEq/L (ref 19–32)
CREATININE: 0.61 mg/dL (ref 0.50–1.10)
Chloride: 104 mEq/L (ref 96–112)
Glucose, Bld: 114 mg/dL — ABNORMAL HIGH (ref 70–99)
Potassium: 4.1 mEq/L (ref 3.5–5.3)
SODIUM: 138 meq/L (ref 135–145)

## 2014-03-23 LAB — TSH: TSH: 0.72 u[IU]/mL (ref 0.350–4.500)

## 2014-03-23 LAB — GLUCOSE, POCT (MANUAL RESULT ENTRY): POC GLUCOSE: 122 mg/dL — AB (ref 70–99)

## 2014-03-23 NOTE — Progress Notes (Addendum)
Subjective:  This chart was scribed for Meredith Staggers, MD by Arlan Organ, [facility] Scribe. This patient was seen in room 14 and the patient's care was started 8:51 AM.    Patient ID: Meghan Park, female    DOB: 1970-11-27, 43 y.o.   MRN: [id number]  HPI  HPI Comments: Meghan Park is a 43 y.o. female who presents to [facility] here for fatigue and malaise today. Pts past medical history below including type 2 diabetes mellitus, B12 deficiency, anxiety, and depression. She is followed by Dr. Sanda Linger PCP. Last office visit 03/09/2014 with Dr. Yetta Barre for pyelonephritis as well as yeast in her urine. Next follow up 9/1 with Dr. Yetta Barre. Electrolytes from July visit reviewed, normal with glucose 104. CBC also normal during visit with hemoglobin 12.1. B12, TSH, and folate level normal on 12/21/2013 visit. Last A1C 7.1 on 12/21/2013. She reports constant, moderate fatigue onset 2-3 days that is unchanged. States she has been sleeping for several hours throughout the day which she states is new for her. She admits to sleeping for about 10-12 hours each evening. Denies any recent new events. No new supplement use. Anxiety and depression stable at this time without any worsening with recent fatigue. She also reports a new cough onset 2 weeks which she attributes to her seasonal allergies. Pt also mentions mild, ongoing abdominal pain which she states is chronic in nature for her. No fever, CP, vomiting, SOB or congestion. She has been checking her blood sugars regularly with readings ranging from 120-140. Admits to lows in 80's, however, no recent symptomatic lows.    Patient Active Problem List   Diagnosis Date Noted  . Abnormal CT scan, pelvis 02/27/2014  . Pelvic pain in female 02/12/2014  . Pyelonephritis, acute 02/12/2014  . B12 deficiency anemia 01/13/2014  . Anemia, iron deficiency 12/21/2013  . Benign paroxysmal positional vertigo 12/21/2013  . Other  screening mammogram 10/01/2013  . Anal fissure 08/20/2013  . Nausea alone 08/20/2013  . Hyperlipidemia LDL goal < 100 04/06/2013  . Diabetic neuropathy, painful 04/06/2013  . Tobacco abuse 11/09/2012  . Obesity, unspecified 11/09/2012  . HTN (hypertension) 11/09/2012  . Irritable bowel syndrome 11/09/2012  . Chronic cholecystitis with calculus 03/28/2012  . DM (diabetes mellitus), type 2, uncontrolled 09/05/2011  . Anxiety associated with depression    Past Medical History  Diagnosis Date  . DM (diabetes mellitus), type 2, uncontrolled   . Anxiety associated with depression   . Hip pain, right   . Depression   . Anxiety   . Pneumonia hx  . Cystitis   . Cholelithiasis   . Fatty liver   . Arthritis hip, right  . Insomnia   . Hypertension   . Nausea   . Anal fissure   . Chronic headaches   . HLD (hyperlipidemia)   . Pneumonia    Past Surgical History  Procedure Laterality Date  . Cesarean section      x2  . Abdominal hysterectomy  2012  . Cholecystectomy  04/13/12  . Ercp  04/24/2012    Procedure: ENDOSCOPIC RETROGRADE CHOLANGIOPANCREATOGRAPHY (ERCP);  Surgeon: Theda Belfast, MD;  Location: [facility];  Service: Endoscopy;  Laterality: N/A;  . Tubal ligation    . Ercp  06/02/2012    Procedure: ENDOSCOPIC RETROGRADE CHOLANGIOPANCREATOGRAPHY (ERCP);  Surgeon: Theda Belfast, MD;  Location: [facility];  Service: Endoscopy;  Laterality: N/A;   Allergies  Allergen Reactions  . Januvia [  Sitagliptin]     GI upset  . Morphine And Related Dermatitis and Other (See Comments)    Severe itching leading to welts and open wounds.  Marcelline Deist [Dapagliflozin]     yeast   Prior to Admission medications   Medication Sig Start Date End Date Taking? Authorizing Provider  clonazePAM (KLONOPIN) 1 MG tablet TAKE 1/2 TO 1 TABLET BY MOUTH EVERYDAY AS NEEDED OR ANXIETY 12/21/13  Yes Etta Grandchild, MD  Cyanocobalamin 500 MCG/0.1ML SOLN Place 0.1 mLs (500 mcg total) into the nose once a  week. 01/13/14  Yes Etta Grandchild, MD  cyclobenzaprine (FLEXERIL) 10 MG tablet Take 1 tablet (10 mg total) by mouth at bedtime. As Needed For Muscle Spasms 12/21/13  Yes Etta Grandchild, MD  dicyclomine (BENTYL) 20 MG tablet Take 1 tablet (20 mg total) by mouth daily. 11/09/12  Yes Peyton Najjar, MD  Fe Cbn-Fe Gluc-FA-B12-C-DSS (FERRALET 90) 90-1 MG TABS Take 1 tablet by mouth daily. 12/22/13  Yes Etta Grandchild, MD  gabapentin (NEURONTIN) 100 MG capsule Take 1 capsule (100 mg total) by mouth daily. 04/06/13  Yes Lorre Munroe, NP  glipiZIDE (GLUCOTROL XL) 10 MG 24 hr tablet TAKE 1 TABLET BY MOUTH EVERY MORNING AND TAKE 2 TABLETS EVERY EVENING FOR DIABETES   Yes Etta Grandchild, MD  glucose blood test strip 1 each by Other route as needed. Use as instructed   Yes Historical Provider, MD  ibuprofen (ADVIL,MOTRIN) 200 MG tablet Take 400 mg by mouth every 4 (four) hours as needed. For pain   Yes Historical Provider, MD  Insulin Detemir (LEVEMIR FLEXTOUCH) 100 UNIT/ML Pen Inject 30 Units into the skin daily at 10 pm. 10/08/13  Yes Etta Grandchild, MD  Insulin Pen Needle 32G X 4 MM MISC 1 Act by Does not apply route daily. 10/08/13  Yes Etta Grandchild, MD  L-Methylfolate-Algae (DEPLIN 15) 15-90.314 MG CAPS Take 1 capsule by mouth daily. 12/21/13  Yes Etta Grandchild, MD  lisinopril (PRINIVIL,ZESTRIL) 10 MG tablet TAKE 1 TABLET BY MOUTH DAILY   Yes Chelle S Jeffery, PA-C  metFORMIN (GLUCOPHAGE) 1000 MG tablet Take 1 tablet (1,000 mg total) by mouth daily with breakfast. 12/21/13  Yes Etta Grandchild, MD  NON FORMULARY Place rectally QID. Nitroglycerin oitment 0.125 08/20/13  Yes Historical Provider, MD  ondansetron (ZOFRAN-ODT) 8 MG disintegrating tablet Take 1 tablet (8 mg total) by mouth every 8 (eight) hours as needed for nausea. 02/13/14  Yes Etta Grandchild, MD  traMADol (ULTRAM) 50 MG tablet Take 1 tablet (50 mg total) by mouth every 8 (eight) hours as needed. 03/05/14  Yes Etta Grandchild, MD  Vilazodone HCl  (VIIBRYD) 40 MG TABS Take 1 tablet (40 mg total) by mouth daily. 10/30/13  Yes Etta Grandchild, MD   History   Social History  . Marital Status: Married    Spouse Name: N/A    Number of Children: 2  . Years of Education: N/A   Occupational History  . coordiantor    Social History Main Topics  . Smoking status: Current Every Day Smoker -- 0.50 packs/day for 20 years    Types: Cigarettes  . Smokeless tobacco: Never Used  . Alcohol Use: No  . Drug Use: No  . Sexual Activity: Yes    Birth Control/ Protection: Other-see comments     Comment: hysterectomy   Other Topics Concern  . Not on file   Social History Narrative   married  Employment: data entry   Science writer daily     Review of Systems  Constitutional: Positive for fatigue. Negative for unexpected weight change.  Respiratory: Positive for cough. Negative for chest tightness and shortness of breath.   Cardiovascular: Negative for chest pain, palpitations and leg swelling.  Gastrointestinal: Positive for abdominal pain. Negative for blood in stool.  Neurological: Negative for dizziness, syncope, light-headedness and headaches.     Objective:   Physical Exam  Vitals reviewed. Constitutional: She is oriented to person, place, and time. She appears well-developed and well-nourished. No distress.  HENT:  Head: Normocephalic and atraumatic.  Right Ear: Hearing, tympanic membrane, external ear and ear canal normal.  Left Ear: Hearing, tympanic membrane, external ear and ear canal normal.  Nose: Nose normal.  Mouth/Throat: Oropharynx is clear and moist. No oropharyngeal exudate.  Eyes: Conjunctivae and EOM are normal. Pupils are equal, round, and reactive to light.  Neck: Carotid bruit is not present.  Cardiovascular: Normal rate, regular rhythm, normal heart sounds and intact distal pulses.   No murmur heard. Pulmonary/Chest: Effort normal and breath sounds normal. No respiratory distress. She has no  wheezes. She has no rhonchi.  Abdominal: Soft. She exhibits no pulsatile midline mass. There is tenderness. There is no rebound and no guarding.  Suprapubic tenderness noted  Neurological: She is alert and oriented to person, place, and time.  Skin: Skin is warm and dry. No rash noted.  Psychiatric: She has a normal mood and affect. Her behavior is normal.   Filed Vitals:   03/23/14 0823  BP: 118/78  Pulse: 80  Temp: 99.1 F (37.3 C)  TempSrc: Oral  Resp: 16  Height:  (1.6 m)  Weight: 191 lb 3.2 oz (86.728 kg)  SpO2: 100%   Results for orders placed in visit on 03/23/14  POCT CBC      Result Value Ref Range   WBC 7.8  4.6 - 10.2 K/uL   Lymph, poc 2.7  0.6 - 3.4   POC LYMPH PERCENT 35.2  10 - 50 %L   MID (cbc) 0.6  0 - 0.9   POC MID % 7.6  0 - 12 %M   POC Granulocyte 4.5  2 - 6.9   Granulocyte percent 57.2  37 - 80 %G   RBC 4.20  4.04 - 5.48 M/uL   Hemoglobin 12.4  12.2 - 16.2 g/dL   HCT, POC 16.1 (*) 09.6 - 47.9 %   MCV 89.1  80 - 97 fL   MCH, POC 29.4  27 - 31.2 pg   MCHC 33.0  31.8 - 35.4 g/dL   RDW, POC 04.5     Platelet Count, POC 235  142 - 424 K/uL   MPV 6.7  0 - 99.8 fL  GLUCOSE, POCT (MANUAL RESULT ENTRY)      Result Value Ref Range   POC Glucose 122 (*) 70 - 99 mg/dl  POCT URINALYSIS DIPSTICK      Result Value Ref Range   Color, UA yellow     Clarity, UA clear     Glucose, UA neg     Bilirubin, UA neg     Ketones, UA neg     Spec Grav, UA <=1.005     Blood, UA neg     pH, UA 5.5     Protein, UA neg     Urobilinogen, UA 0.2     Nitrite, UA neg     Leukocytes, UA Negative  POCT UA - MICROSCOPIC ONLY      Result Value Ref Range   WBC, Ur, HPF, POC neg     RBC, urine, microscopic neg     Bacteria, U Microscopic neg     Mucus, UA neg     Epithelial cells, urine per micros 1-4     Crystals, Ur, HPF, POC neg     Casts, Ur, LPF, POC neg     Yeast, UA neg      Assessment & Plan:   Meghan Park is a 43 y.o. female Cough - Plan: POCT CBC  -  lungs cta, afebrile, cbc WNL. Allergies possible - ok to try otc claritin.   Other malaise and fatigue - Plan: TSH, Basic metabolic panel, hx of B12 deficiency anemia - Plan: POCT CBC, Basic metabolic panel  - tsh and BMP pending, but in office labs ok. Denies change in her depression/anxiety sx's. Advised against daytime naps if possible.  Follow up with PCP. RTC/ER precautions.   Type 2 diabetes mellitus without complication - Plan: POCT glucose (manual entry)  -glucose ok in office. Denies symptomatic lows. Follow up with PCP as planned.   Suprapubic abdominal pain, unspecified laterality - Plan: POCT urinalysis dipstick, POCT UA - Microscopic Only  -chronic by her report. U/a negative, CBC wnl.  Follow up as planned in few days with PCP to determine if further imaging needed at that time. RTC/ER if any worsening.   No orders of the defined types were placed in this encounter.   Patient Instructions  You should receive a call or letter about your lab results within the next week to 10 days.  Over the counter claritin if needed for allergies.  Your labs in our office looked ok.  Can keep follow up with your primary provider's office next week.  Try to avoid taking naps during the day as this can cause you to be more tired.  Return to the clinic or go to the nearest emergency room if any of your symptoms worsen or new symptoms occur.  Fatigue Fatigue is a feeling of tiredness, lack of energy, lack of motivation, or feeling tired all the time. Having enough rest, good nutrition, and reducing stress will normally reduce fatigue. Consult your caregiver if it persists. The nature of your fatigue will help your caregiver to find out its cause. The treatment is based on the cause.  CAUSES  There are many causes for fatigue. Most of the time, fatigue can be traced to one or more of your habits or routines. Most causes fit into one or more of three general areas. They are: Lifestyle  problems  Sleep disturbances.  Overwork.  Physical exertion.  Unhealthy habits.  Poor eating habits or eating disorders.  Alcohol and/or drug use .  Lack of proper nutrition (malnutrition). Psychological problems  Stress and/or anxiety problems.  Depression.  Grief.  Boredom. Medical Problems or Conditions  Anemia.  Pregnancy.  Thyroid gland problems.  Recovery from major surgery.  Continuous pain.  Emphysema or asthma that is not well controlled  Allergic conditions.  Diabetes.  Infections (such as mononucleosis).  Obesity.  Sleep disorders, such as sleep apnea.  Heart failure or other heart-related problems.  Cancer.  Kidney disease.  Liver disease.  Effects of certain medicines such as antihistamines, cough and cold remedies, prescription pain medicines, heart and blood pressure medicines, drugs used for treatment of cancer, and some antidepressants. SYMPTOMS  The symptoms of fatigue include:   Lack  of energy.  Lack of drive (motivation).  Drowsiness.  Feeling of indifference to the surroundings. DIAGNOSIS  The details of how you feel help guide your caregiver in finding out what is causing the fatigue. You will be asked about your present and past health condition. It is important to review all medicines that you take, including prescription and non-prescription items. A thorough exam will be done. You will be questioned about your feelings, habits, and normal lifestyle. Your caregiver may suggest blood tests, urine tests, or other tests to look for common medical causes of fatigue.  TREATMENT  Fatigue is treated by correcting the underlying cause. For example, if you have continuous pain or depression, treating these causes will improve how you feel. Similarly, adjusting the dose of certain medicines will help in reducing fatigue.  HOME CARE INSTRUCTIONS   Try to get the required amount of good sleep every night.  Eat a healthy and  nutritious diet, and drink enough water throughout the day.  Practice ways of relaxing (including yoga or meditation).  Exercise regularly.  Make plans to change situations that cause stress. Act on those plans so that stresses decrease over time. Keep your work and personal routine reasonable.  Avoid street drugs and minimize use of alcohol.  Start taking a daily multivitamin after consulting your caregiver. SEEK MEDICAL CARE IF:   You have persistent tiredness, which cannot be accounted for.  You have fever.  You have unintentional weight loss.  You have headaches.  You have disturbed sleep throughout the night.  You are feeling sad.  You have constipation.  You have dry skin.  You have gained weight.  You are taking any new or different medicines that you suspect are causing fatigue.  You are unable to sleep at night.  You develop any unusual swelling of your legs or other parts of your body. SEEK IMMEDIATE MEDICAL CARE IF:   You are feeling confused.  Your vision is blurred.  You feel faint or pass out.  You develop severe headache.  You develop severe abdominal, pelvic, or back pain.  You develop chest pain, shortness of breath, or an irregular or fast heartbeat.  You are unable to pass a normal amount of urine.  You develop abnormal bleeding such as bleeding from the rectum or you vomit blood.  You have thoughts about harming yourself or committing suicide.  You are worried that you might harm someone else. MAKE SURE YOU:   Understand these instructions.  Will watch your condition.  Will get help right away if you are not doing well or get worse. Document Released: 05/09/2007 Document Revised: 10/04/2011 Document Reviewed: 11/13/2013 [facility] Patient Information 2015 [place], [place]. This information is not intended to replace advice given to you by your health care provider. Make sure you discuss any questions you have with your health care  provider.    I personally performed the services described in this documentation, which was scribed in my presence. The recorded information has been reviewed and is accurate.

## 2014-03-23 NOTE — Patient Instructions (Addendum)
You should receive a call or letter about your lab results within the next week to 10 days.  Over the counter claritin if needed for allergies.  Your labs in our office looked ok.  Can keep follow up with your primary provider's office next week.  Try to avoid taking naps during the day as this can cause you to be more tired.  Return to the clinic or go to the nearest emergency room if any of your symptoms worsen or new symptoms occur.  Fatigue Fatigue is a feeling of tiredness, lack of energy, lack of motivation, or feeling tired all the time. Having enough rest, good nutrition, and reducing stress will normally reduce fatigue. Consult your caregiver if it persists. The nature of your fatigue will help your caregiver to find out its cause. The treatment is based on the cause.  CAUSES  There are many causes for fatigue. Most of the time, fatigue can be traced to one or more of your habits or routines. Most causes fit into one or more of three general areas. They are: Lifestyle problems  Sleep disturbances.  Overwork.  Physical exertion.  Unhealthy habits.  Poor eating habits or eating disorders.  Alcohol and/or drug use .  Lack of proper nutrition (malnutrition). Psychological problems  Stress and/or anxiety problems.  Depression.  Grief.  Boredom. Medical Problems or Conditions  Anemia.  Pregnancy.  Thyroid gland problems.  Recovery from major surgery.  Continuous pain.  Emphysema or asthma that is not well controlled  Allergic conditions.  Diabetes.  Infections (such as mononucleosis).  Obesity.  Sleep disorders, such as sleep apnea.  Heart failure or other heart-related problems.  Cancer.  Kidney disease.  Liver disease.  Effects of certain medicines such as antihistamines, cough and cold remedies, prescription pain medicines, heart and blood pressure medicines, drugs used for treatment of cancer, and some antidepressants. SYMPTOMS  The symptoms  of fatigue include:   Lack of energy.  Lack of drive (motivation).  Drowsiness.  Feeling of indifference to the surroundings. DIAGNOSIS  The details of how you feel help guide your caregiver in finding out what is causing the fatigue. You will be asked about your present and past health condition. It is important to review all medicines that you take, including prescription and non-prescription items. A thorough exam will be done. You will be questioned about your feelings, habits, and normal lifestyle. Your caregiver may suggest blood tests, urine tests, or other tests to look for common medical causes of fatigue.  TREATMENT  Fatigue is treated by correcting the underlying cause. For example, if you have continuous pain or depression, treating these causes will improve how you feel. Similarly, adjusting the dose of certain medicines will help in reducing fatigue.  HOME CARE INSTRUCTIONS   Try to get the required amount of good sleep every night.  Eat a healthy and nutritious diet, and drink enough water throughout the day.  Practice ways of relaxing (including yoga or meditation).  Exercise regularly.  Make plans to change situations that cause stress. Act on those plans so that stresses decrease over time. Keep your work and personal routine reasonable.  Avoid street drugs and minimize use of alcohol.  Start taking a daily multivitamin after consulting your caregiver. SEEK MEDICAL CARE IF:   You have persistent tiredness, which cannot be accounted for.  You have fever.  You have unintentional weight loss.  You have headaches.  You have disturbed sleep throughout the night.  You are feeling  sad.  You have constipation.  You have dry skin.  You have gained weight.  You are taking any new or different medicines that you suspect are causing fatigue.  You are unable to sleep at night.  You develop any unusual swelling of your legs or other parts of your body. SEEK  IMMEDIATE MEDICAL CARE IF:   You are feeling confused.  Your vision is blurred.  You feel faint or pass out.  You develop severe headache.  You develop severe abdominal, pelvic, or back pain.  You develop chest pain, shortness of breath, or an irregular or fast heartbeat.  You are unable to pass a normal amount of urine.  You develop abnormal bleeding such as bleeding from the rectum or you vomit blood.  You have thoughts about harming yourself or committing suicide.  You are worried that you might harm someone else. MAKE SURE YOU:   Understand these instructions.  Will watch your condition.  Will get help right away if you are not doing well or get worse. Document Released: 05/09/2007 Document Revised: 10/04/2011 Document Reviewed: 11/13/2013 [facility] Patient Information 2015 [place], [place]. This information is not intended to replace advice given to you by your health care provider. Make sure you discuss any questions you have with your health care provider.

## 2014-03-26 ENCOUNTER — Telehealth: Payer: Self-pay | Admitting: Internal Medicine

## 2014-03-26 ENCOUNTER — Ambulatory Visit: Payer: Managed Care, Other (non HMO) | Admitting: Internal Medicine

## 2014-03-26 DIAGNOSIS — Z0289 Encounter for other administrative examinations: Secondary | ICD-10-CM

## 2014-03-26 NOTE — Telephone Encounter (Signed)
Patient no showed for acute visit with Dr. Jonny Ruiz 03/26/2014.  Patients has appt standing with Dr. Yetta Barre for 04/15/2014.  Please advise.

## 2014-04-15 ENCOUNTER — Encounter: Payer: Self-pay | Admitting: Internal Medicine

## 2014-04-15 ENCOUNTER — Other Ambulatory Visit (INDEPENDENT_AMBULATORY_CARE_PROVIDER_SITE_OTHER): Payer: Managed Care, Other (non HMO)

## 2014-04-15 ENCOUNTER — Telehealth: Payer: Self-pay

## 2014-04-15 ENCOUNTER — Ambulatory Visit (INDEPENDENT_AMBULATORY_CARE_PROVIDER_SITE_OTHER): Payer: Managed Care, Other (non HMO) | Admitting: Internal Medicine

## 2014-04-15 VITALS — BP 124/82 | HR 90 | Temp 98.8°F | Resp 16 | Wt 189.0 lb

## 2014-04-15 DIAGNOSIS — Z23 Encounter for immunization: Secondary | ICD-10-CM

## 2014-04-15 DIAGNOSIS — E1165 Type 2 diabetes mellitus with hyperglycemia: Secondary | ICD-10-CM

## 2014-04-15 DIAGNOSIS — IMO0002 Reserved for concepts with insufficient information to code with codable children: Secondary | ICD-10-CM

## 2014-04-15 DIAGNOSIS — IMO0001 Reserved for inherently not codable concepts without codable children: Secondary | ICD-10-CM

## 2014-04-15 DIAGNOSIS — E785 Hyperlipidemia, unspecified: Secondary | ICD-10-CM

## 2014-04-15 DIAGNOSIS — I10 Essential (primary) hypertension: Secondary | ICD-10-CM

## 2014-04-15 DIAGNOSIS — F341 Dysthymic disorder: Secondary | ICD-10-CM

## 2014-04-15 DIAGNOSIS — F418 Other specified anxiety disorders: Secondary | ICD-10-CM

## 2014-04-15 LAB — BASIC METABOLIC PANEL
BUN: 8 mg/dL (ref 6–23)
CO2: 28 meq/L (ref 19–32)
CREATININE: 0.7 mg/dL (ref 0.4–1.2)
Calcium: 9.6 mg/dL (ref 8.4–10.5)
Chloride: 107 mEq/L (ref 96–112)
GFR: 103.8 mL/min (ref >60.00)
Glucose, Bld: 45 mg/dL — CL (ref 70–99)
Potassium: 3.6 mEq/L (ref 3.5–5.1)
Sodium: 140 mEq/L (ref 135–145)

## 2014-04-15 LAB — LIPID PANEL
CHOL/HDL RATIO: 5
Cholesterol: 158 mg/dL (ref 0–200)
HDL: 31.8 mg/dL — AB (ref >39.00)
LDL Cholesterol: 96 mg/dL (ref 0–99)
NonHDL: 126.2
Triglycerides: 153 mg/dL — ABNORMAL HIGH (ref 0.0–149.0)
VLDL: 30.6 mg/dL (ref 0.0–40.0)

## 2014-04-15 LAB — HEMOGLOBIN A1C: Hgb A1c MFr Bld: 6.1 % (ref 4.6–6.5)

## 2014-04-15 MED ORDER — LEVOMILNACIPRAN HCL ER 40 MG PO CP24: 1.0000 | ORAL_CAPSULE | Freq: Every day | ORAL | Status: DC

## 2014-04-15 NOTE — Telephone Encounter (Signed)
Hematology called and pt glucose is critical at 45

## 2014-04-15 NOTE — Progress Notes (Signed)
Pre visit review using our clinic review tool, if applicable. No additional management support is needed unless otherwise documented below in the visit note. 

## 2014-04-15 NOTE — Patient Instructions (Signed)

## 2014-04-15 NOTE — Progress Notes (Signed)
Subjective:    Patient ID: Meghan Park, female    DOB: [birth year], 43 y.o.   MRN: [id number]  Diabetes She presents for her follow-up diabetic visit. She has type 2 diabetes mellitus. Her disease course has been stable. There are no hypoglycemic associated symptoms. Pertinent negatives for hypoglycemia include no confusion, dizziness or tremors. Pertinent negatives for diabetes include no blurred vision, no chest pain, no fatigue, no foot paresthesias, no foot ulcerations, no polydipsia, no polyphagia, no polyuria, no visual change, no weakness and no weight loss. There are no hypoglycemic complications. Symptoms are stable. There are no diabetic complications. Current diabetic treatment includes insulin injections. She is compliant with treatment all of the time. She is following a generally healthy diet. Meal planning includes avoidance of concentrated sweets. She has not had a previous visit with a dietician. She participates in exercise intermittently. There is no change in her home blood glucose trend. An ACE inhibitor/angiotensin II receptor blocker is being taken. She does not see a podiatrist.Eye exam is current.      Review of Systems  Constitutional: Negative for fever, chills, weight loss, diaphoresis, appetite change and fatigue.  HENT: Negative.   Eyes: Negative.  Negative for blurred vision.  Respiratory: Negative.  Negative for cough, choking, chest tightness, shortness of breath and stridor.   Cardiovascular: Negative.  Negative for chest pain and leg swelling.  Gastrointestinal: Negative.  Negative for nausea, vomiting, abdominal pain, diarrhea, constipation and blood in stool.  Endocrine: Negative.  Negative for polydipsia, polyphagia and polyuria.  Genitourinary: Negative.  Negative for dysuria, urgency, frequency, hematuria, flank pain, decreased urine volume and difficulty urinating.  Musculoskeletal: Negative.  Negative for arthralgias, back pain, joint swelling and  myalgias.  Skin: Negative.  Negative for rash.  Allergic/Immunologic: Negative.   Neurological: Negative.  Negative for dizziness, tremors, syncope, weakness, light-headedness and numbness.  Hematological: Negative.  Negative for adenopathy. Does not bruise/bleed easily.  Psychiatric/Behavioral: Positive for sleep disturbance and dysphoric mood. Negative for suicidal ideas, hallucinations, behavioral problems, confusion, self-injury, decreased concentration and agitation. The patient is not hyperactive.        She tells me that Viibryd has not helped, she has persistent sadness, fatigue, crying spells, anxiety, panic, abnormal sleep patterns (sleeps a lot on the weekends), anhedonia, and feels helpless at times.       Objective:   Physical Exam  Vitals reviewed. Constitutional: She is oriented to person, place, and time. She appears well-developed and well-nourished. No distress.  HENT:  Head: Normocephalic and atraumatic.  Mouth/Throat: Oropharynx is clear and moist. No oropharyngeal exudate.  Eyes: Conjunctivae are normal. Right eye exhibits no discharge. Left eye exhibits no discharge. No scleral icterus.  Neck: Normal range of motion. Neck supple. No JVD present. No tracheal deviation present. No thyromegaly present.  Cardiovascular: Normal rate, regular rhythm, normal heart sounds and intact distal pulses.  Exam reveals no gallop and no friction rub.   No murmur heard. Pulmonary/Chest: Effort normal and breath sounds normal. No stridor. No respiratory distress. She has no wheezes. She has no rales. She exhibits no tenderness.  Abdominal: Soft. Bowel sounds are normal. She exhibits no distension and no mass. There is no tenderness. There is no rebound and no guarding.  Musculoskeletal: Normal range of motion. She exhibits no edema and no tenderness.  Lymphadenopathy:    She has no cervical adenopathy.  Neurological: She is oriented to person, place, and time.  Skin: Skin is warm and  dry. No rash noted.  She is not diaphoretic. No erythema. No pallor.  Psychiatric: Her speech is normal and behavior is normal. Judgment and thought content normal. Her mood appears anxious. Her affect is not angry, not blunt, not labile and not inappropriate. Cognition and memory are normal. Cognition and memory are not impaired. She exhibits a depressed mood. She expresses no homicidal and no suicidal ideation. She expresses no suicidal plans and no homicidal plans. She exhibits normal recent memory and normal remote memory.     Lab Results  Component Value Date   WBC 7.8 03/23/2014   HGB 12.4 03/23/2014   HCT 37.4* 03/23/2014   PLT 234.0 02/12/2014   GLUCOSE 114* 03/23/2014   CHOL 179 04/06/2013   TRIG 149.0 04/06/2013   HDL 42.50 04/06/2013   LDLCALC 107* 04/06/2013   ALT 14 02/12/2014   AST 17 02/12/2014   NA 138 03/23/2014   K 4.1 03/23/2014   CL 104 03/23/2014   CREATININE 0.61 03/23/2014   BUN 5* 03/23/2014   CO2 26 03/23/2014   TSH 0.720 03/23/2014   INR 1.18 04/22/2012   HGBA1C 7.1* 12/21/2013   MICROALBUR 0.6 04/06/2013       Assessment & Plan:

## 2014-04-16 ENCOUNTER — Encounter: Payer: Self-pay | Admitting: Internal Medicine

## 2014-04-16 NOTE — Assessment & Plan Note (Signed)
Her blood sugars are well controlled 

## 2014-04-16 NOTE — Assessment & Plan Note (Signed)
Viibryd has not helped Will change to [employer]

## 2014-04-16 NOTE — Assessment & Plan Note (Signed)
She has achieved her LDL goal 

## 2014-04-16 NOTE — Assessment & Plan Note (Signed)
Her BP is well controlled Lytes and renal function are stable 

## 2014-04-22 ENCOUNTER — Telehealth: Payer: Self-pay | Admitting: *Deleted

## 2014-04-22 NOTE — Telephone Encounter (Signed)
[facility] Triage Call Report Triage Record Num: [id number] Operator: Albertine Grates Patient Name: Meghan Park Call Date & Time: 04/21/2014 6:14:15PM Patient Phone: [phone] PCP: Sanda Linger Patient Gender: Female PCP Fax : Patient DOB: [birth year] Practice Name: [facility] Reason for Call: Caller: Lashia/Patient; PCP: Sanda Linger (Adults only); CB#: [phone]; States blood sugar has been 49-82 "all day" 9-27. States drank apple juice and has not gotten higher than 82. Is 82 at time of call. No symptoms. Wants to know if should take Levemir. Advised not to take if blood sugar <110. Does not check blood sugar routinely. Did not check when woke 9-27. Home care advice given per Diabetes Control Problems protocol. Protocol(s) Used: Diabetes: Control Problems Recommended Outcome per Protocol: Provide Home/Self Care Reason for Outcome: Signs/symptoms of hypogylcemia AND responds to action plan as defined by provider Care Advice: ~ 09/

## 2014-04-24 ENCOUNTER — Encounter: Payer: Self-pay | Admitting: Internal Medicine

## 2014-05-01 ENCOUNTER — Other Ambulatory Visit: Payer: Self-pay | Admitting: Internal Medicine

## 2014-05-20 ENCOUNTER — Ambulatory Visit: Payer: Managed Care, Other (non HMO) | Admitting: Psychology

## 2014-05-30 ENCOUNTER — Other Ambulatory Visit: Payer: Self-pay | Admitting: *Deleted

## 2014-05-30 ENCOUNTER — Encounter: Payer: Self-pay | Admitting: Internal Medicine

## 2014-05-30 DIAGNOSIS — F418 Other specified anxiety disorders: Secondary | ICD-10-CM

## 2014-05-30 DIAGNOSIS — E1140 Type 2 diabetes mellitus with diabetic neuropathy, unspecified: Secondary | ICD-10-CM

## 2014-05-30 MED ORDER — GABAPENTIN 100 MG PO CAPS: 100.0000 mg | ORAL_CAPSULE | Freq: Every day | ORAL | Status: DC

## 2014-05-30 MED ORDER — CLONAZEPAM 1 MG PO TABS: ORAL_TABLET | ORAL | Status: DC

## 2014-05-30 NOTE — Telephone Encounter (Signed)
Refill request for clonazepam Last filled by MD on- 12/21/13 #30 x3  Refill request for gabapentin Last filled by MD on- 04/15/2013 #90 x3  Last Appt: 04/15/2014 Next Appt: 07/08/2014 Please advise refills?

## 2014-06-13 ENCOUNTER — Ambulatory Visit (INDEPENDENT_AMBULATORY_CARE_PROVIDER_SITE_OTHER): Payer: Managed Care, Other (non HMO)

## 2014-06-14 ENCOUNTER — Ambulatory Visit (INDEPENDENT_AMBULATORY_CARE_PROVIDER_SITE_OTHER): Payer: Managed Care, Other (non HMO) | Admitting: Emergency Medicine

## 2014-06-14 ENCOUNTER — Ambulatory Visit (INDEPENDENT_AMBULATORY_CARE_PROVIDER_SITE_OTHER): Payer: Managed Care, Other (non HMO)

## 2014-06-14 VITALS — BP 144/100 | HR 101 | Temp 98.1°F | Resp 16 | Ht 63.5 in | Wt 180.6 lb

## 2014-06-14 DIAGNOSIS — M25551 Pain in right hip: Secondary | ICD-10-CM

## 2014-06-14 MED ORDER — NAPROXEN SODIUM 550 MG PO TABS: 550.0000 mg | ORAL_TABLET | Freq: Two times a day (BID) | ORAL | Status: AC

## 2014-06-14 NOTE — Progress Notes (Signed)
Subjective:    Patient ID: Meghan Park, female    DOB: 04-Jan-1971, 43 y.o.   MRN: [id number]  Chief Complaint  Patient presents with  . Hip Pain    Right, "felt it pop Saturday night"    HPI Giannamarie Lyndal RainbowCapps is a 43 year old female with PMH listed below that is here today for right hip pain that began 6 days ago.  She went to stand up when she felt a popping sensation.  This was painfully rated 6/10.  She sat back down and had a bout of nausea.  She then stood back up minutes later and the popping sensation was felt again.  This time more intense.  Pain was 10/10, lasted for seconds, but then the leg felt better.  It radiates toward her right groin.  She denies any numbness or tingling, or changing of skin color.  Since this time she has noted that the pain progressively worsened.  It is aggravated by walking.  She is unable to lay on her right side.  She takes ibuprofen for relief which she states does work, but she has to take it several times per day.    Past Medical History  Diagnosis Date  . DM (diabetes mellitus), type 2, uncontrolled   . Anxiety associated with depression   . Hip pain, right   . Depression   . Anxiety   . Pneumonia hx  . Cystitis   . Cholelithiasis   . Fatty liver   . Arthritis hip, right  . Insomnia   . Hypertension   . Nausea   . Anal fissure   . Chronic headaches   . HLD (hyperlipidemia)   . Pneumonia    Family History  Problem Relation Age of Onset  . Anxiety disorder Mother   . Hyperlipidemia Mother   . Arthritis Father   . COPD Father   . Heart disease Father   . Heart disease Maternal Grandfather   . Congestive Heart Failure Maternal Grandmother   . Heart disease Maternal Grandmother   . Heart disease Paternal Grandfather   . Breast cancer Paternal Grandmother     great  . Diabetes Maternal Aunt   . Irritable bowel syndrome Mother    History   Social History  . Marital Status: Married    Spouse Name: N/A    Number of Children: 2  .  Years of Education: N/A   Occupational History  . coordiantor    Social History Main Topics  . Smoking status: Current Every Day Smoker -- 0.50 packs/day for 20 years    Types: Cigarettes  . Smokeless tobacco: Never Used  . Alcohol Use: No  . Drug Use: No  . Sexual Activity: Yes    Birth Control/ Protection: Other-see comments     Comment: hysterectomy   Other Topics Concern  . 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 Cesarean section      x2  . Abdominal hysterectomy  2012  . Cholecystectomy  04/13/12  . Ercp  04/24/2012    Procedure: ENDOSCOPIC RETROGRADE CHOLANGIOPANCREATOGRAPHY (ERCP);  Surgeon: Theda BelfastPatrick D Hung, MD;  [facility];  Service: Endoscopy;  Laterality: N/A;  . Tubal ligation    . Ercp  06/02/2012    Procedure: ENDOSCOPIC RETROGRADE CHOLANGIOPANCREATOGRAPHY (ERCP);  Surgeon: Theda BelfastPatrick D Hung, MD;  Location: [facility];  Service: Endoscopy;  Laterality: N/A;   Current outpatient prescriptions: clonazePAM (KLONOPIN) 1 MG tablet, TAKE 1/2 TO 1 TABLET BY MOUTH EVERYDAY AS NEEDED OR ANXIETY, Disp: 30 tablet, Rfl: 3;  cyclobenzaprine (FLEXERIL) 10 MG tablet, TAKE 1 TABLET BY MOUTH AT BEDTIME AS NEEDED FOR MUSCLE SPASMS, Disp: 30 tablet, Rfl: 11;  dicyclomine (BENTYL) 20 MG tablet, Take 1 tablet (20 mg total) by mouth daily., Disp: 60 tablet, Rfl: 4 Fe Cbn-Fe Gluc-FA-B12-C-DSS (FERRALET 90) 90-1 MG TABS, Take 1 tablet by mouth daily., Disp: 30 each, Rfl: 11;  FETZIMA 40 MG CP24, TAKE ONE CAPSULE BY MOUTH EVERY DAY, Disp: 30 capsule, Rfl: 11;  gabapentin (NEURONTIN) 100 MG capsule, Take 1 capsule (100 mg total) by mouth daily., Disp: 90 capsule, Rfl: 3 glipiZIDE (GLUCOTROL XL) 10 MG 24 hr tablet, TAKE 1 TABLET BY MOUTH EVERY MORNING AND TAKE 2 TABLETS EVERY EVENING FOR DIABETES, Disp: 45 tablet, Rfl: 5;  glucose blood test strip, 1 each by Other route as  needed. Use as instructed, Disp: , Rfl: ;  ibuprofen (ADVIL,MOTRIN) 200 MG tablet, Take 400 mg by mouth every 4 (four) hours as needed. For pain, Disp: , Rfl:  Insulin Detemir (LEVEMIR FLEXTOUCH) 100 UNIT/ML Pen, Inject 30 Units into the skin daily at 10 pm., Disp: 15 mL, Rfl: 11;  Insulin Pen Needle 32G X 4 MM MISC, 1 Act by Does not apply route daily., Disp: 100 each, Rfl: 3;  lisinopril (PRINIVIL,ZESTRIL) 10 MG tablet, TAKE 1 TABLET BY MOUTH DAILY, Disp: 90 tablet, Rfl: 0;  metFORMIN (GLUCOPHAGE) 1000 MG tablet, Take 1 tablet (1,000 mg total) by mouth daily with breakfast., Disp: 60 tablet, Rfl: 12 NON FORMULARY, Place rectally QID. Nitroglycerin oitment 0.125, Disp: , Rfl: ;  ondansetron (ZOFRAN-ODT) 8 MG disintegrating tablet, Take 1 tablet (8 mg total) by mouth every 8 (eight) hours as needed for nausea., Disp: 30 tablet, Rfl: 2;  traMADol (ULTRAM) 50 MG tablet, Take 1 tablet (50 mg total) by mouth every 8 (eight) hours as needed., Disp: 65 tablet, Rfl: 0   Review of Systems  Musculoskeletal: Positive for myalgias, back pain and gait problem. Negative for joint swelling.       Objective:   Physical Exam  Constitutional: She is oriented to person, place, and time. She appears well-developed and well-nourished.  HENT:  Head: Normocephalic and atraumatic.  Cardiovascular: Normal rate, regular rhythm, normal heart sounds and intact distal pulses.   Pulmonary/Chest: Effort normal and breath sounds normal.  Musculoskeletal:       Back:  No tenderness along bursa or hip with palpation.  Lateral flexion and bending is normal with no tenderness.  Hip resisted flexion is 3/5.    Leg length is equal.  No tenderness along bursa.  No pain with leg extension.    Neurological: She is alert and oriented to person, place, and time. She displays no atrophy. She exhibits normal muscle tone.  Reflex Scores:      Patellar reflexes are 2+ on the right side and 2+ on the left side. Skin: Skin is warm and  dry.  Psychiatric: She has a normal mood and affect. Her behavior is normal. Thought content normal.   Right Hip Xray [facility] reading (PRIMARY) by  Dr. Dareen PianoAnderson. No abnormalities detected.       Assessment & Plan:  43 year old female with PMH listed above is here today for chief complaint of 6 day hip pain.  XRay is normal.  This could be possible dislocation that may  have set back in place.  Discussed verbally and with handout stretches and exercises for hip.  She will return in one week if symptoms do not improve, in which we will order referral to ortho. Diff dx: Ligament tear, muscle strain, hip dislocation.  Unlikely bursitis, or sciatica.       Right hip pain  DG Hip Complete Right, naproxen sodium (ANAPROX) 550 MG tablet -Will RTC in 1 week if symptoms do not improve.  Will refer to ortho.  Trena Platt, PA-C [facility] 11/20/20152:40 PM

## 2014-06-14 NOTE — Patient Instructions (Signed)
Hip Pain Your hip is the joint between your upper legs and your lower pelvis. The bones, cartilage, tendons, and muscles of your hip joint perform a lot of work each day supporting your body weight and allowing you to move around. Hip pain can range from a minor ache to severe pain in one or both of your hips. Pain may be felt on the inside of the hip joint near the groin, or the outside near the buttocks and upper thigh. You may have swelling or stiffness as well.  HOME CARE INSTRUCTIONS   Take medicines only as directed by your health care provider.  Apply ice to the injured area:  Put ice in a plastic bag.  Place a towel between your skin and the bag.  Leave the ice on for 15-20 minutes at a time, 3-4 times a day.  Keep your leg raised (elevated) when possible to lessen swelling.  Avoid activities that cause pain.  Follow specific exercises as directed by your health care provider.  Sleep with a pillow between your legs on your most comfortable side.  Record how often you have hip pain, the location of the pain, and what it feels like. SEEK MEDICAL CARE IF:   You are unable to put weight on your leg.  Your hip is red or swollen or very tender to touch.  Your pain or swelling continues or worsens after 1 week.  You have increasing difficulty walking.  You have a fever. SEEK IMMEDIATE MEDICAL CARE IF:   You have fallen.  You have a sudden increase in pain and swelling in your hip. MAKE SURE YOU:   Understand these instructions.  Will watch your condition.  Will get help right away if you are not doing well or get worse. Document Released: 12/30/2009 Document Revised: 11/26/2013 Document Reviewed: 03/08/2013 [employer] Patient Information 2015 [employer]. This information is not intended to replace advice given to you by your health care provider. Make sure you discuss any questions you have with your health care provider.   Hip Exercises RANGE OF MOTION (ROM)  AND STRETCHING EXERCISES  These exercises may help you when beginning to rehabilitate your injury. Doing them too aggressively can worsen your condition. Complete them slowly and gently. Your symptoms may resolve with or without further involvement from your physician, physical therapist or athletic trainer. While completing these exercises, remember:   Restoring tissue flexibility helps normal motion to return to the joints. This allows healthier, less painful movement and activity.  An effective stretch should be held for at least 30 seconds.  A stretch should never be painful. You should only feel a gentle lengthening or release in the stretched tissue. If these stretches worsen your symptoms even when done gently, consult your physician, physical therapist or athletic trainer. STRETCH - Hamstrings, Supine   Lie on your back. Loop a belt or towel over the ball of your right / left foot.  Straighten your right / left knee and slowly pull on the belt to raise your leg. Do not allow the right / left knee to bend. Keep your opposite leg flat on the floor.  Raise the leg until you feel a gentle stretch behind your right / left knee or thigh. Hold this position for __________ seconds. Repeat __________ times. Complete this stretch __________ times per day.  STRETCH - Hip Rotators   Lie on your back on a firm surface. Grasp your right / left knee with your right / left hand and   your ankle with your opposite hand.  Keeping your hips and shoulders firmly planted, gently pull your right / left knee and rotate your lower leg toward your opposite shoulder until you feel a stretch in your buttocks.  Hold this stretch for __________ seconds. Repeat this stretch __________ times. Complete this stretch __________ times per day. STRETCH - Hamstrings/Adductors, V-Sit   Sit on the floor with your legs extended in a large "V," keeping your knees straight.  With your head and chest upright, bend at your  waist reaching for your right foot to stretch your left adductors.  You should feel a stretch in your left inner thigh. Hold for __________ seconds.  Return to the upright position to relax your leg muscles.  Continuing to keep your chest upright, bend straight forward at your waist to stretch your hamstrings.  You should feel a stretch behind both of your thighs and/or knees. Hold for __________ seconds.  Return to the upright position to relax your leg muscles.  Repeat steps 2 through 4 for opposite leg. Repeat __________ times. Complete this exercise __________ times per day.  STRETCHING - Hip Flexors, Lunge  Half kneel with your right / left knee on the floor and your opposite knee bent and directly over your ankle.  Keep good posture with your head over your shoulders. Tighten your buttocks to point your tailbone downward; this will prevent your back from arching too much.  You should feel a gentle stretch in the front of your thigh and/or hip. If you do not feel any resistance, slightly slide your opposite foot forward and then slowly lunge forward so your knee once again lines up over your ankle. Be sure your tailbone remains pointed downward.  Hold this stretch for __________ seconds. Repeat __________ times. Complete this stretch __________ times per day. STRENGTHENING EXERCISES These exercises may help you when beginning to rehabilitate your injury. They may resolve your symptoms with or without further involvement from your physician, physical therapist or athletic trainer. While completing these exercises, remember:   Muscles can gain both the endurance and the strength needed for everyday activities through controlled exercises.  Complete these exercises as instructed by your physician, physical therapist or athletic trainer. Progress the resistance and repetitions only as guided.  You may experience muscle soreness or fatigue, but the pain or discomfort you are trying to  eliminate should never worsen during these exercises. If this pain does worsen, stop and make certain you are following the directions exactly. If the pain is still present after adjustments, discontinue the exercise until you can discuss the trouble with your clinician. STRENGTH - Hip Extensors, Bridge   Lie on your back on a firm surface. Bend your knees and place your feet flat on the floor.  Tighten your buttocks muscles and lift your bottom off the floor until your trunk is level with your thighs. You should feel the muscles in your buttocks and back of your thighs working. If you do not feel these muscles, slide your feet 1-2 inches further away from your buttocks.  Hold this position for __________ seconds.  Slowly lower your hips to the starting position and allow your buttock muscles relax completely before beginning the next repetition.  If this exercise is too easy, you may cross your arms over your chest. Repeat __________ times. Complete this exercise __________ times per day.  STRENGTH - Hip Abductors, Straight Leg Raises  Be aware of your form throughout the entire exercise so that you   exercise the correct muscles. Sloppy form means that you are not strengthening the correct muscles.  Lie on your side so that your head, shoulders, knee and hip line up. You may bend your lower knee to help maintain your balance. Your right / left leg should be on top.  Roll your hips slightly forward, so that your hips are stacked directly over each other and your right / left knee is facing forward.  Lift your top leg up 4-6 inches, leading with your heel. Be sure that your foot does not drift forward or that your knee does not roll toward the ceiling.  Hold this position for __________ seconds. You should feel the muscles in your outer hip lifting (you may not notice this until your leg begins to tire).  Slowly lower your leg to the starting position. Allow the muscles to fully relax before  beginning the next repetition. Repeat __________ times. Complete this exercise __________ times per day.  STRENGTH - Hip Adductors, Straight Leg Raises   Lie on your side so that your head, shoulders, knee and hip line up. You may place your upper foot in front to help maintain your balance. Your right / left leg should be on the bottom.  Roll your hips slightly forward, so that your hips are stacked directly over each other and your right / left knee is facing forward.  Tense the muscles in your inner thigh and lift your bottom leg 4-6 inches. Hold this position for __________ seconds.  Slowly lower your leg to the starting position. Allow the muscles to fully relax before beginning the next repetition. Repeat __________ times. Complete this exercise __________ times per day.  STRENGTH - Quadriceps, Straight Leg Raises  Quality counts! Watch for signs that the quadriceps muscle is working to insure you are strengthening the correct muscles and not "cheating" by substituting with healthier muscles.  Lay on your back with your right / left leg extended and your opposite knee bent.  Tense the muscles in the front of your right / left thigh. You should see either your knee cap slide up or increased dimpling just above the knee. Your thigh may even quiver.  Tighten these muscles even more and raise your leg 4 to 6 inches off the floor. Hold for right / left seconds.  Keeping these muscles tense, lower your leg.  Relax the muscles slowly and completely in between each repetition. Repeat __________ times. Complete this exercise __________ times per day.  STRENGTH - Hip Abductors, Standing  Tie one end of a rubber exercise band/tubing to a secure surface (table, pole) and tie a loop at the other end.  Place the loop around your right / left ankle. Keeping your ankle with the band directly opposite of the secured end, step away until there is tension in the tube/band.  Hold onto a chair as  needed for balance.  Keeping your back upright, your shoulders over your hips, and your toes pointing forward, lift your right / left leg out to your side. Be sure to lift your leg with your hip muscles. Do not "throw" your leg or tip your body to lift your leg.  Slowly and with control, return to the starting position. Repeat exercise __________ times. Complete this exercise __________ times per day.  STRENGTH - Quadriceps, Squats  Stand in a door frame so that your feet and knees are in line with the frame.  Use your hands for balance, not support, on the frame.    Slowly lower your weight, bending at the hips and knees. Keep your lower legs upright so that they are parallel with the door frame. Squat only within the range that does not increase your knee pain. Never let your hips drop below your knees.  Slowly return upright, pushing with your legs, not pulling with your hands. Document Released: 07/30/2005 Document Revised: 10/04/2011 Document Reviewed: 10/24/2008 [employer] Patient Information 2015 [employer]. This information is not intended to replace advice given to you by your health care provider. Make sure you discuss any questions you have with your health care provider.    

## 2014-07-01 ENCOUNTER — Other Ambulatory Visit: Payer: Self-pay | Admitting: Internal Medicine

## 2014-07-08 ENCOUNTER — Ambulatory Visit: Payer: Managed Care, Other (non HMO) | Admitting: Internal Medicine

## 2014-07-17 ENCOUNTER — Ambulatory Visit (INDEPENDENT_AMBULATORY_CARE_PROVIDER_SITE_OTHER): Payer: Managed Care, Other (non HMO) | Admitting: Internal Medicine

## 2014-07-17 ENCOUNTER — Encounter: Payer: Self-pay | Admitting: Internal Medicine

## 2014-07-17 ENCOUNTER — Other Ambulatory Visit (INDEPENDENT_AMBULATORY_CARE_PROVIDER_SITE_OTHER): Payer: Managed Care, Other (non HMO)

## 2014-07-17 VITALS — BP 114/76 | HR 112 | Temp 98.8°F | Resp 14 | Ht 62.5 in | Wt 173.4 lb

## 2014-07-17 DIAGNOSIS — D519 Vitamin B12 deficiency anemia, unspecified: Secondary | ICD-10-CM

## 2014-07-17 DIAGNOSIS — D509 Iron deficiency anemia, unspecified: Secondary | ICD-10-CM

## 2014-07-17 DIAGNOSIS — IMO0002 Reserved for concepts with insufficient information to code with codable children: Secondary | ICD-10-CM

## 2014-07-17 DIAGNOSIS — I10 Essential (primary) hypertension: Secondary | ICD-10-CM

## 2014-07-17 DIAGNOSIS — E1165 Type 2 diabetes mellitus with hyperglycemia: Secondary | ICD-10-CM

## 2014-07-17 LAB — MICROALBUMIN / CREATININE URINE RATIO
Creatinine,U: 62.1 mg/dL
Microalb Creat Ratio: 0.8 mg/g (ref 0.0–30.0)
Microalb, Ur: 0.5 mg/dL (ref 0.0–1.9)

## 2014-07-17 LAB — CBC WITH DIFFERENTIAL/PLATELET
BASOS PCT: 0.5 % (ref 0.0–3.0)
Basophils Absolute: 0 10*3/uL (ref 0.0–0.1)
Eosinophils Absolute: 0.2 10*3/uL (ref 0.0–0.7)
Eosinophils Relative: 1.8 % (ref 0.0–5.0)
HCT: 40.7 % (ref 36.0–46.0)
Hemoglobin: 13.7 g/dL (ref 12.0–15.0)
LYMPHS PCT: 23.9 % (ref 12.0–46.0)
Lymphs Abs: 2.4 10*3/uL (ref 0.7–4.0)
MCHC: 33.6 g/dL (ref 30.0–36.0)
MCV: 92.8 fl (ref 78.0–100.0)
Monocytes Absolute: 0.6 10*3/uL (ref 0.1–1.0)
Monocytes Relative: 6.3 % (ref 3.0–12.0)
Neutro Abs: 6.6 10*3/uL (ref 1.4–7.7)
Neutrophils Relative %: 67.5 % (ref 43.0–77.0)
Platelets: 222 10*3/uL (ref 150.0–400.0)
RBC: 4.39 Mil/uL (ref 3.87–5.11)
RDW: 13.1 % (ref 11.5–15.5)
WBC: 9.8 10*3/uL (ref 4.0–10.5)

## 2014-07-17 LAB — BASIC METABOLIC PANEL
BUN: 10 mg/dL (ref 6–23)
CALCIUM: 9 mg/dL (ref 8.4–10.5)
CO2: 26 meq/L (ref 19–32)
CREATININE: 0.6 mg/dL (ref 0.4–1.2)
Chloride: 102 mEq/L (ref 96–112)
GFR: 115.73 mL/min (ref >60.00)
GLUCOSE: 103 mg/dL — AB (ref 70–99)
Potassium: 3.8 mEq/L (ref 3.5–5.1)
Sodium: 136 mEq/L (ref 135–145)

## 2014-07-17 LAB — HEMOGLOBIN A1C: Hgb A1c MFr Bld: 5.8 % (ref 4.6–6.5)

## 2014-07-17 LAB — URINALYSIS, ROUTINE W REFLEX MICROSCOPIC
Bilirubin Urine: NEGATIVE
Hgb urine dipstick: NEGATIVE
Ketones, ur: NEGATIVE
Leukocytes, UA: NEGATIVE
Nitrite: NEGATIVE
RBC / HPF: NONE SEEN (ref 0–?)
SPECIFIC GRAVITY, URINE: 1.01 (ref 1.000–1.030)
TOTAL PROTEIN, URINE-UPE24: NEGATIVE
UROBILINOGEN UA: 0.2 (ref 0.0–1.0)
Urine Glucose: NEGATIVE
pH: 6 (ref 5.0–8.0)

## 2014-07-17 NOTE — Assessment & Plan Note (Signed)
She has consistently had some low blood sugars, at times down into the 40's and 50" I have asked her to stop taking the SU and insulin Will cont metformin for now Will recheck her A1C and her renal function today

## 2014-07-17 NOTE — Progress Notes (Signed)
Pre visit review using our clinic review tool, if applicable. No additional management support is needed unless otherwise documented below in the visit note. 

## 2014-07-17 NOTE — Progress Notes (Signed)
   Subjective:    Patient ID: Meghan Park, female    DOB: [birth year], 43 y.o.   MRN: [id number]  Diabetes She presents for her follow-up diabetic visit. She has type 2 diabetes mellitus. Her disease course has been improving. Hypoglycemia symptoms include hunger, nervousness/anxiousness and sweats. Pertinent negatives for diabetes include no blurred vision, no chest pain, no fatigue, no foot paresthesias, no foot ulcerations, no polydipsia, no polyphagia, no polyuria, no visual change, no weakness and no weight loss. There are no hypoglycemic complications. Symptoms are worsening. Diabetic complications include peripheral neuropathy. Current diabetic treatment includes oral agent (dual therapy) and insulin injections. She is compliant with treatment most of the time. Her weight is decreasing steadily. She is following a generally healthy diet. Meal planning includes avoidance of concentrated sweets. She has not had a previous visit with a dietitian. She participates in exercise intermittently. Her home blood glucose trend is decreasing rapidly. An ACE inhibitor/angiotensin II receptor blocker is being taken. She does not see a podiatrist.Eye exam is current.      Review of Systems  Constitutional: Negative for weight loss and fatigue.  Eyes: Negative for blurred vision.  Cardiovascular: Negative for chest pain.  Endocrine: Negative for polydipsia, polyphagia and polyuria.  Neurological: Negative for weakness.  Psychiatric/Behavioral: The patient is nervous/anxious.   All other systems reviewed and are negative.      Objective:   Physical Exam  Constitutional: She is oriented to person, place, and time. She appears well-developed and well-nourished. No distress.  HENT:  Head: Normocephalic and atraumatic.  Mouth/Throat: Oropharynx is clear and moist. No oropharyngeal exudate.  Eyes: Conjunctivae are normal. Right eye exhibits no discharge. Left eye exhibits no discharge. No scleral  icterus.  Neck: Normal range of motion. Neck supple. No JVD present. No tracheal deviation present. No thyromegaly present.  Cardiovascular: Normal rate, regular rhythm, normal heart sounds and intact distal pulses.  Exam reveals no gallop and no friction rub.   No murmur heard. Pulmonary/Chest: Effort normal and breath sounds normal. No stridor. No respiratory distress. She has no wheezes. She has no rales. She exhibits no tenderness.  Abdominal: Soft. Bowel sounds are normal. She exhibits no distension and no mass. There is no tenderness. There is no rebound and no guarding.  Musculoskeletal: Normal range of motion. She exhibits no edema or tenderness.  Lymphadenopathy:    She has no cervical adenopathy.  Neurological: She is oriented to person, place, and time.  Skin: Skin is warm and dry. No rash noted. She is not diaphoretic. No erythema. No pallor.  Psychiatric: She has a normal mood and affect. Her behavior is normal. Judgment and thought content normal.  Vitals reviewed.    Lab Results  Component Value Date   WBC 7.8 03/23/2014   HGB 12.4 03/23/2014   HCT 37.4* 03/23/2014   PLT 234.0 02/12/2014   GLUCOSE 45* 04/15/2014   CHOL 158 04/15/2014   TRIG 153.0* 04/15/2014   HDL 31.80* 04/15/2014   LDLCALC 96 04/15/2014   ALT 14 02/12/2014   AST 17 02/12/2014   NA 140 04/15/2014   K 3.6 04/15/2014   CL 107 04/15/2014   CREATININE 0.7 04/15/2014   BUN 8 04/15/2014   CO2 28 04/15/2014   TSH 0.720 03/23/2014   INR 1.18 04/22/2012   HGBA1C 6.1 04/15/2014   MICROALBUR 0.6 04/06/2013       Assessment & Plan:

## 2014-07-17 NOTE — Patient Instructions (Signed)

## 2014-07-17 NOTE — Assessment & Plan Note (Signed)
I have asked her to be more compliant with her vitamin supplements Will recheck her CBC today

## 2014-07-17 NOTE — Assessment & Plan Note (Signed)
Her BP is well controlled Will monitor her lytes and renal function today 

## 2014-08-01 ENCOUNTER — Other Ambulatory Visit: Payer: Self-pay | Admitting: Internal Medicine

## 2014-08-05 ENCOUNTER — Encounter: Payer: Self-pay | Admitting: Internal Medicine

## 2014-08-05 ENCOUNTER — Other Ambulatory Visit: Payer: Self-pay | Admitting: *Deleted

## 2014-08-05 MED ORDER — LEVOMILNACIPRAN HCL ER 40 MG PO CP24: 1.0000 | ORAL_CAPSULE | Freq: Every day | ORAL | Status: DC

## 2014-08-05 NOTE — Telephone Encounter (Signed)
Sent email needing her Fetzima sent to [facility] in [place]....Meghan [email or web address]/lmb

## 2014-08-27 ENCOUNTER — Encounter: Payer: Self-pay | Admitting: Internal Medicine

## 2014-08-27 ENCOUNTER — Other Ambulatory Visit: Payer: Self-pay | Admitting: Internal Medicine

## 2014-08-27 DIAGNOSIS — F418 Other specified anxiety disorders: Secondary | ICD-10-CM

## 2014-09-12 ENCOUNTER — Encounter (HOSPITAL_COMMUNITY): Payer: Self-pay | Admitting: Psychology

## 2014-09-12 ENCOUNTER — Ambulatory Visit (INDEPENDENT_AMBULATORY_CARE_PROVIDER_SITE_OTHER): Payer: Managed Care, Other (non HMO) | Admitting: Psychology

## 2014-09-12 DIAGNOSIS — F332 Major depressive disorder, recurrent severe without psychotic features: Secondary | ICD-10-CM

## 2014-09-12 DIAGNOSIS — F339 Major depressive disorder, recurrent, unspecified: Secondary | ICD-10-CM | POA: Insufficient documentation
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
## 2014-09-20 ENCOUNTER — Ambulatory Visit (HOSPITAL_COMMUNITY): Payer: Self-pay | Admitting: Psychology

## 2014-10-01 ENCOUNTER — Other Ambulatory Visit: Payer: Self-pay | Admitting: Internal Medicine

## 2014-10-02 ENCOUNTER — Ambulatory Visit (INDEPENDENT_AMBULATORY_CARE_PROVIDER_SITE_OTHER): Payer: Managed Care, Other (non HMO) | Admitting: Psychology

## 2014-10-02 DIAGNOSIS — F332 Major depressive disorder, recurrent severe without psychotic features: Secondary | ICD-10-CM

## 2014-10-02 NOTE — Progress Notes (Signed)
   THERAPIST PROGRESS NOTE  Session Time: 9.05am-9.55am  Participation Level: Active  Behavioral Response: Well GroomedAlertDepressed  Type of Therapy: Individual Therapy  Treatment Goals addressed: Diagnosis: MDD and goal 1.  Interventions: CBT and Other: breath work and tx planning  Summary: Meghan Park is a 44 y.o. female who presents with w/ depressed affect and mood.  Pt reported that interaction w/ daughter went ok following last session- was tension but nothing negative in interactions.  Pt reported that she had a very difficult week w/ ruminating on stressor of daughter and their relationship last week and wasn't getting up to do anything. Pt reported that her father unexpectedly died last week and that had changed the focus for her and she has been focused in the past week caretaking for her mother through this.  Pt acknowledged that grief hasn't set in.  Pt reported however that being able to function again over this past week to help w/ mother has given hope for self for ability to improve.  Pt discussed her goals for tx and was able to identify indicators that she wants to see towards improvement.  Pt was receptive of use of mindfulness and breath work for coping.  Pt discussed ways that she can change her current routine of getting in bed when she gets home.  Pt participated in breath work and discussed feeling relaxed following.    Suicidal/Homicidal: Nowithout intent/plan  Therapist Response: Assessed pt current functioning per pt report. Processed w/pt depressed mood, ruminating thoughts contributing to depression and using mindfulness and breath work to assist in disrupting this pattern. Explored w/ pt her grieving w/ father's death last week.  Led pt through deep breathing practice focusing on awarenes of breath, slowing breath, fullness of breath and breathing into areas of tension.    Plan: Return again in 2 weeks. Pt to f/u as scheduled w/ Dr. Michae KavaAgarwal next week.  Pt to use  breathing practice daily, increase time outdoors and change routine to not retreat to bed when returns home from work.   Diagnosis: MDD, recurrent, severe   YATES,LEANNE, LPC 10/02/2014

## 2014-10-10 ENCOUNTER — Ambulatory Visit (INDEPENDENT_AMBULATORY_CARE_PROVIDER_SITE_OTHER): Payer: Managed Care, Other (non HMO) | Admitting: Psychiatry

## 2014-10-10 ENCOUNTER — Encounter (HOSPITAL_COMMUNITY): Payer: Self-pay | Admitting: Psychiatry

## 2014-10-10 VITALS — BP 127/90 | HR 96 | Ht 62.0 in | Wt 165.4 lb

## 2014-10-10 DIAGNOSIS — F331 Major depressive disorder, recurrent, moderate: Secondary | ICD-10-CM | POA: Insufficient documentation

## 2014-10-10 DIAGNOSIS — F17210 Nicotine dependence, cigarettes, uncomplicated: Secondary | ICD-10-CM | POA: Insufficient documentation

## 2014-10-10 DIAGNOSIS — F411 Generalized anxiety disorder: Secondary | ICD-10-CM | POA: Insufficient documentation

## 2014-10-10 MED ORDER — SERTRALINE HCL 100 MG PO TABS: 100.0000 mg | ORAL_TABLET | Freq: Every day | ORAL | Status: DC

## 2014-10-10 MED ORDER — HYDROXYZINE PAMOATE 50 MG PO CAPS: 50.0000 mg | ORAL_CAPSULE | Freq: Three times a day (TID) | ORAL | Status: DC | PRN
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
## 2014-10-16 ENCOUNTER — Ambulatory Visit (HOSPITAL_COMMUNITY): Payer: Self-pay | Admitting: Psychology

## 2014-10-18 ENCOUNTER — Ambulatory Visit (INDEPENDENT_AMBULATORY_CARE_PROVIDER_SITE_OTHER): Payer: Managed Care, Other (non HMO) | Admitting: Ophthalmology

## 2014-10-23 ENCOUNTER — Other Ambulatory Visit: Payer: Self-pay | Admitting: Internal Medicine

## 2014-10-30 ENCOUNTER — Other Ambulatory Visit (INDEPENDENT_AMBULATORY_CARE_PROVIDER_SITE_OTHER): Payer: Managed Care, Other (non HMO)

## 2014-10-30 ENCOUNTER — Ambulatory Visit (HOSPITAL_COMMUNITY): Payer: Self-pay | Admitting: Psychology

## 2014-10-30 ENCOUNTER — Encounter: Payer: Self-pay | Admitting: Internal Medicine

## 2014-10-30 ENCOUNTER — Ambulatory Visit (INDEPENDENT_AMBULATORY_CARE_PROVIDER_SITE_OTHER): Payer: Managed Care, Other (non HMO) | Admitting: Internal Medicine

## 2014-10-30 VITALS — BP 102/74 | HR 118 | Temp 98.6°F | Resp 16 | Wt 157.0 lb

## 2014-10-30 DIAGNOSIS — E1165 Type 2 diabetes mellitus with hyperglycemia: Secondary | ICD-10-CM | POA: Diagnosis not present

## 2014-10-30 DIAGNOSIS — D519 Vitamin B12 deficiency anemia, unspecified: Secondary | ICD-10-CM

## 2014-10-30 DIAGNOSIS — D509 Iron deficiency anemia, unspecified: Secondary | ICD-10-CM | POA: Diagnosis not present

## 2014-10-30 DIAGNOSIS — IMO0002 Reserved for concepts with insufficient information to code with codable children: Secondary | ICD-10-CM

## 2014-10-30 DIAGNOSIS — K589 Irritable bowel syndrome without diarrhea: Secondary | ICD-10-CM

## 2014-10-30 DIAGNOSIS — R197 Diarrhea, unspecified: Secondary | ICD-10-CM

## 2014-10-30 LAB — CBC WITH DIFFERENTIAL/PLATELET
BASOS PCT: 0.4 % (ref 0.0–3.0)
Basophils Absolute: 0 10*3/uL (ref 0.0–0.1)
EOS ABS: 0.2 10*3/uL (ref 0.0–0.7)
Eosinophils Relative: 1.5 % (ref 0.0–5.0)
HEMATOCRIT: 40.9 % (ref 36.0–46.0)
Hemoglobin: 14.1 g/dL (ref 12.0–15.0)
LYMPHS ABS: 2.9 10*3/uL (ref 0.7–4.0)
LYMPHS PCT: 24.5 % (ref 12.0–46.0)
MCHC: 34.5 g/dL (ref 30.0–36.0)
MCV: 90.9 fl (ref 78.0–100.0)
Monocytes Absolute: 0.6 10*3/uL (ref 0.1–1.0)
Monocytes Relative: 5 % (ref 3.0–12.0)
Neutro Abs: 8 10*3/uL — ABNORMAL HIGH (ref 1.4–7.7)
Neutrophils Relative %: 68.6 % (ref 43.0–77.0)
Platelets: 220 10*3/uL (ref 150.0–400.0)
RBC: 4.5 Mil/uL (ref 3.87–5.11)
RDW: 14.4 % (ref 11.5–15.5)
WBC: 11.7 10*3/uL — ABNORMAL HIGH (ref 4.0–10.5)

## 2014-10-30 LAB — COMPREHENSIVE METABOLIC PANEL
ALT: 9 U/L (ref 0–35)
AST: 11 U/L (ref 0–37)
Albumin: 4.1 g/dL (ref 3.5–5.2)
Alkaline Phosphatase: 62 U/L (ref 39–117)
BILIRUBIN TOTAL: 0.5 mg/dL (ref 0.2–1.2)
BUN: 7 mg/dL (ref 6–23)
CALCIUM: 9.2 mg/dL (ref 8.4–10.5)
CHLORIDE: 105 meq/L (ref 96–112)
CO2: 25 meq/L (ref 19–32)
Creatinine, Ser: 0.67 mg/dL (ref 0.40–1.20)
GFR: 101.75 mL/min (ref >60.00)
Glucose, Bld: 93 mg/dL (ref 70–99)
POTASSIUM: 4.2 meq/L (ref 3.5–5.1)
SODIUM: 137 meq/L (ref 135–145)
TOTAL PROTEIN: 7.5 g/dL (ref 6.0–8.3)

## 2014-10-30 LAB — SEDIMENTATION RATE: Sed Rate: 28 mm/hr — ABNORMAL HIGH (ref 0–22)

## 2014-10-30 LAB — FERRITIN: Ferritin: 19.8 ng/mL (ref 10.0–291.0)

## 2014-10-30 LAB — VITAMIN B12: VITAMIN B 12: 211 pg/mL (ref 211–911)

## 2014-10-30 LAB — TSH: TSH: 1.06 u[IU]/mL (ref 0.35–4.50)

## 2014-10-30 LAB — FOLATE: Folate: 12.6 ng/mL (ref >5.9)

## 2014-10-30 LAB — HEMOGLOBIN A1C: Hgb A1c MFr Bld: 5.8 % (ref 4.6–6.5)

## 2014-10-30 MED ORDER — CYANOCOBALAMIN 500 MCG/0.1ML NA SOLN: 0.1000 mL | NASAL | Status: DC

## 2014-10-30 MED ORDER — ELUXADOLINE 75 MG PO TABS: 1.0000 | ORAL_TABLET | Freq: Two times a day (BID) | ORAL | Status: DC

## 2014-10-30 NOTE — Progress Notes (Signed)
Pre visit review using our clinic review tool, if applicable. No additional management support is needed unless otherwise documented below in the visit note. 

## 2014-10-30 NOTE — Progress Notes (Signed)
Subjective:    Patient ID: Meghan Park, female    DOB: [birth year], 44 y.o.   MRN: [id number]  HPI Comments: Her father died about 5 weeks ago from COPD complications and now for 3 weeks she has had diarrhea, about 2-3 watery BM's per day with loss of appetite and weight loss but no abd pain or cramping.  Diarrhea  This is a recurrent problem. The current episode started 1 to 4 weeks ago. The problem occurs 2 to 4 times per day. The problem has been unchanged. The stool consistency is described as watery. Pertinent negatives include no abdominal pain, arthralgias, bloating, chills, coughing, fever, headaches, increased  flatus, myalgias, sweats, URI, vomiting or weight loss. The symptoms are aggravated by stress. Risk factors: she took antibiotics about 5 months ago. She has tried nothing for the symptoms. The treatment provided mild relief. Her past medical history is significant for irritable bowel syndrome.      Review of Systems  Constitutional: Positive for fatigue and unexpected weight change. Negative for fever, chills, weight loss, diaphoresis and appetite change.  HENT: Negative.   Eyes: Negative.   Respiratory: Negative.  Negative for cough, choking, chest tightness, shortness of breath and stridor.   Cardiovascular: Negative.  Negative for chest pain, palpitations and leg swelling.  Gastrointestinal: Positive for diarrhea. Negative for nausea, vomiting, abdominal pain, constipation, blood in stool, anal bleeding, rectal pain, bloating and flatus.  Endocrine: Negative.   Genitourinary: Negative.   Musculoskeletal: Negative.  Negative for myalgias, back pain, joint swelling and arthralgias.  Skin: Negative.  Negative for rash.  Allergic/Immunologic: Negative.   Neurological: Negative.  Negative for dizziness, tremors, syncope, light-headedness, numbness and headaches.  Hematological: Negative.  Negative for adenopathy. Does not bruise/bleed easily.  Psychiatric/Behavioral:  Negative for behavioral problems, sleep disturbance, dysphoric mood, decreased concentration and agitation. The patient is nervous/anxious.        Objective:   Physical Exam  Constitutional: She is oriented to person, place, and time. She appears well-developed and well-nourished. No distress.  HENT:  Head: Normocephalic and atraumatic.  Mouth/Throat: Oropharynx is clear and moist. No oropharyngeal exudate.  Eyes: Conjunctivae are normal. Right eye exhibits no discharge. Left eye exhibits no discharge. No scleral icterus.  Neck: Normal range of motion. Neck supple. No JVD present. No tracheal deviation present. No thyromegaly present.  Cardiovascular: Normal rate, regular rhythm, normal heart sounds and intact distal pulses.  Exam reveals no gallop and no friction rub.   No murmur heard. Pulmonary/Chest: Effort normal and breath sounds normal. No stridor. No respiratory distress. She has no wheezes. She has no rales. She exhibits no tenderness.  Abdominal: Soft. Bowel sounds are normal. She exhibits no distension and no mass. There is no tenderness. There is no rebound and no guarding.  Musculoskeletal: Normal range of motion. She exhibits no edema or tenderness.  Lymphadenopathy:    She has no cervical adenopathy.  Neurological: She is oriented to person, place, and time.  Skin: Skin is warm and dry. No rash noted. She is not diaphoretic. No erythema. No pallor.  Psychiatric: She has a normal mood and affect. Her behavior is normal. Judgment and thought content normal.  Vitals reviewed.    Lab Results  Component Value Date   WBC 11.7* 10/30/2014   HGB 14.1 10/30/2014   HCT 40.9 10/30/2014   PLT 220.0 10/30/2014   GLUCOSE 93 10/30/2014   CHOL 158 04/15/2014   TRIG 153.0* 04/15/2014   HDL 31.80* 04/15/2014  LDLCALC 96 04/15/2014   ALT 9 10/30/2014   AST 11 10/30/2014   NA 137 10/30/2014   K 4.2 10/30/2014   CL 105 10/30/2014   CREATININE 0.67 10/30/2014   BUN 7 10/30/2014     CO2 25 10/30/2014   TSH 1.06 10/30/2014   INR 1.18 04/22/2012   HGBA1C 5.8 10/30/2014   MICROALBUR 0.5 07/17/2014       Assessment & Plan:

## 2014-10-30 NOTE — Assessment & Plan Note (Signed)
Her A1C is down and she has diarrhea Will discontinue metformin

## 2014-10-30 NOTE — Assessment & Plan Note (Signed)
Will treat for IBS with diarrhea with viberzi

## 2014-10-30 NOTE — Assessment & Plan Note (Signed)
Her WBC ct is slightly elevated so will check stool for C diff infection Will stop metformin Will treat for IBS-d with viberzi

## 2014-10-30 NOTE — Assessment & Plan Note (Signed)
Her B12 level is down I have asked her return for a B12 injection and will start nascobal NS

## 2014-10-30 NOTE — Patient Instructions (Signed)

## 2014-11-01 ENCOUNTER — Ambulatory Visit (INDEPENDENT_AMBULATORY_CARE_PROVIDER_SITE_OTHER): Payer: Managed Care, Other (non HMO)

## 2014-11-01 ENCOUNTER — Other Ambulatory Visit: Payer: Managed Care, Other (non HMO)

## 2014-11-01 DIAGNOSIS — D519 Vitamin B12 deficiency anemia, unspecified: Secondary | ICD-10-CM | POA: Diagnosis not present

## 2014-11-01 MED ORDER — CYANOCOBALAMIN 1000 MCG/ML IJ SOLN
1000.0000 ug | Freq: Once | INTRAMUSCULAR | Status: AC
  Administered 2014-11-01: 1000 ug via INTRAMUSCULAR

## 2014-11-05 ENCOUNTER — Other Ambulatory Visit: Payer: Managed Care, Other (non HMO)

## 2014-11-05 ENCOUNTER — Other Ambulatory Visit: Payer: Self-pay | Admitting: Internal Medicine

## 2014-11-05 DIAGNOSIS — R197 Diarrhea, unspecified: Secondary | ICD-10-CM

## 2014-11-06 ENCOUNTER — Encounter: Payer: Self-pay | Admitting: Internal Medicine

## 2014-11-06 ENCOUNTER — Other Ambulatory Visit: Payer: Self-pay | Admitting: Internal Medicine

## 2014-11-06 LAB — C. DIFFICILE GDH AND TOXIN A/B
C. difficile GDH: DETECTED — AB
C. difficile Toxin A/B: NOT DETECTED

## 2014-11-06 LAB — FECAL LACTOFERRIN, QUANT: LACTOFERRIN: NEGATIVE

## 2014-11-06 LAB — CLOSTRIDIUM DIFFICILE BY PCR: Toxigenic C. Difficile by PCR: NOT DETECTED

## 2014-11-07 ENCOUNTER — Other Ambulatory Visit: Payer: Self-pay | Admitting: Internal Medicine

## 2014-11-07 ENCOUNTER — Other Ambulatory Visit: Payer: Self-pay

## 2014-11-07 DIAGNOSIS — D72829 Elevated white blood cell count, unspecified: Secondary | ICD-10-CM | POA: Insufficient documentation

## 2014-11-07 DIAGNOSIS — Z1231 Encounter for screening mammogram for malignant neoplasm of breast: Secondary | ICD-10-CM

## 2014-11-08 ENCOUNTER — Ambulatory Visit (INDEPENDENT_AMBULATORY_CARE_PROVIDER_SITE_OTHER): Payer: Managed Care, Other (non HMO) | Admitting: Ophthalmology

## 2014-11-08 ENCOUNTER — Other Ambulatory Visit (INDEPENDENT_AMBULATORY_CARE_PROVIDER_SITE_OTHER): Payer: Managed Care, Other (non HMO)

## 2014-11-08 DIAGNOSIS — D72829 Elevated white blood cell count, unspecified: Secondary | ICD-10-CM | POA: Diagnosis not present

## 2014-11-08 LAB — CBC WITH DIFFERENTIAL/PLATELET
BASOS PCT: 0.8 % (ref 0.0–3.0)
Basophils Absolute: 0.1 10*3/uL (ref 0.0–0.1)
EOS PCT: 2.3 % (ref 0.0–5.0)
Eosinophils Absolute: 0.2 10*3/uL (ref 0.0–0.7)
HEMATOCRIT: 40.6 % (ref 36.0–46.0)
HEMOGLOBIN: 14.1 g/dL (ref 12.0–15.0)
LYMPHS ABS: 2.4 10*3/uL (ref 0.7–4.0)
Lymphocytes Relative: 28.8 % (ref 12.0–46.0)
MCHC: 34.7 g/dL (ref 30.0–36.0)
MCV: 91.4 fl (ref 78.0–100.0)
Monocytes Absolute: 0.6 10*3/uL (ref 0.1–1.0)
Monocytes Relative: 6.7 % (ref 3.0–12.0)
Neutro Abs: 5.1 10*3/uL (ref 1.4–7.7)
Neutrophils Relative %: 61.4 % (ref 43.0–77.0)
PLATELETS: 203 10*3/uL (ref 150.0–400.0)
RBC: 4.44 Mil/uL (ref 3.87–5.11)
RDW: 14.5 % (ref 11.5–15.5)
WBC: 8.3 10*3/uL (ref 4.0–10.5)

## 2014-11-12 ENCOUNTER — Other Ambulatory Visit: Payer: Self-pay | Admitting: Internal Medicine

## 2014-11-12 ENCOUNTER — Ambulatory Visit
Admission: RE | Admit: 2014-11-12 | Discharge: 2014-11-12 | Disposition: A | Discharge status: Home / Self Care | Payer: Managed Care, Other (non HMO) | Source: Ambulatory Visit

## 2014-11-12 ENCOUNTER — Encounter: Payer: Self-pay | Admitting: Internal Medicine

## 2014-11-12 DIAGNOSIS — R197 Diarrhea, unspecified: Secondary | ICD-10-CM

## 2014-11-12 DIAGNOSIS — Z1231 Encounter for screening mammogram for malignant neoplasm of breast: Secondary | ICD-10-CM

## 2014-11-12 LAB — SPEP & IFE WITH QIG
ALPHA-1-GLOBULIN: 0.3 g/dL (ref 0.2–0.3)
Albumin ELP: 4 g/dL (ref 3.8–4.8)
Alpha-2-Globulin: 0.9 g/dL (ref 0.5–0.9)
BETA GLOBULIN: 0.5 g/dL (ref 0.4–0.6)
Beta 2: 0.4 g/dL (ref 0.2–0.5)
GAMMA GLOBULIN: 1.1 g/dL (ref 0.8–1.7)
IGG (IMMUNOGLOBIN G), SERUM: 1210 mg/dL (ref 690–1700)
IgA: 408 mg/dL — ABNORMAL HIGH (ref 69–380)
IgM, Serum: 71 mg/dL (ref 52–322)
Total Protein, Serum Electrophoresis: 7 g/dL (ref 6.1–8.1)

## 2014-11-12 LAB — HM MAMMOGRAPHY: HM Mammogram: NORMAL

## 2014-11-14 ENCOUNTER — Telehealth: Payer: Self-pay | Admitting: Internal Medicine

## 2014-11-14 NOTE — Telephone Encounter (Signed)
Pt called in and said that her meds  is not working.  She is still sick on her stomach and needs to know what else she can take til she can see GI 11/26/2014  Best number [phone]

## 2014-11-14 NOTE — Telephone Encounter (Signed)
I need more detail about the symptoms and what she has done to manage the symptoms

## 2014-11-15 MED ORDER — PROMETHAZINE HCL 25 MG PO TABS: 12.5000 mg | ORAL_TABLET | Freq: Three times a day (TID) | ORAL | Status: DC | PRN

## 2014-11-15 NOTE — Telephone Encounter (Signed)
Patient notified

## 2014-11-15 NOTE — Telephone Encounter (Signed)
Will start phenergan as needed for nausea. If her symptoms continue to worsen I recommend she be seen in an Urgent Care or ED.

## 2014-11-15 NOTE — Telephone Encounter (Signed)
Per pt mychart response "I came in to see Dr Yetta BarreJones last week with vomiting, diarrhea hand just weak and dizzy. He has taken blood and stool test. My nausea is still very bad. I take soft an but it has not helped. I have left side pain going into my back and have been out of work 2 days this week. I have An appt with GI on May 3 but since I'm still not feeling well that seems a long time away. Please advise if there is another option for the nAusea and what my next step should be."  I spoke with patient this morning who c/o continued nausea and vomiting at least once a day for the last three days, also states that she has been taking zofran 1 tab every 8 hours with no resolve. Patient would like to know what else she can try that may help get some relief. She has not transportation today and unable to come in for an appt. Patient last seen 10/30/14 for the same issues. Please advise PCP out of the office until Monday. Thanks

## 2014-11-18 ENCOUNTER — Telehealth: Payer: Self-pay | Admitting: Gastroenterology

## 2014-11-18 NOTE — Telephone Encounter (Signed)
Spoke with the patient and moved her appointment to this week. She reports daily vomiting. She says it reminds her of when she was sick and had to have a stent placed. She will go to the ER if she acutely worsens before the appointment.

## 2014-11-21 ENCOUNTER — Ambulatory Visit (INDEPENDENT_AMBULATORY_CARE_PROVIDER_SITE_OTHER): Payer: Managed Care, Other (non HMO) | Admitting: Physician Assistant

## 2014-11-21 ENCOUNTER — Other Ambulatory Visit (INDEPENDENT_AMBULATORY_CARE_PROVIDER_SITE_OTHER): Payer: Managed Care, Other (non HMO)

## 2014-11-21 ENCOUNTER — Encounter: Payer: Self-pay | Admitting: Physician Assistant

## 2014-11-21 VITALS — BP 100/60 | HR 86 | Ht 63.0 in | Wt 159.0 lb

## 2014-11-21 DIAGNOSIS — R1013 Epigastric pain: Secondary | ICD-10-CM | POA: Diagnosis not present

## 2014-11-21 DIAGNOSIS — D509 Iron deficiency anemia, unspecified: Secondary | ICD-10-CM

## 2014-11-21 DIAGNOSIS — R195 Other fecal abnormalities: Secondary | ICD-10-CM

## 2014-11-21 DIAGNOSIS — R1310 Dysphagia, unspecified: Secondary | ICD-10-CM | POA: Diagnosis not present

## 2014-11-21 DIAGNOSIS — R197 Diarrhea, unspecified: Secondary | ICD-10-CM

## 2014-11-21 DIAGNOSIS — R1114 Bilious vomiting: Secondary | ICD-10-CM

## 2014-11-21 LAB — COMPREHENSIVE METABOLIC PANEL
ALBUMIN: 4.2 g/dL (ref 3.5–5.2)
ALT: 11 U/L (ref 0–35)
AST: 14 U/L (ref 0–37)
Alkaline Phosphatase: 75 U/L (ref 39–117)
BUN: 9 mg/dL (ref 6–23)
CO2: 28 mEq/L (ref 19–32)
Calcium: 9.5 mg/dL (ref 8.4–10.5)
Chloride: 99 mEq/L (ref 96–112)
Creatinine, Ser: 0.61 mg/dL (ref 0.40–1.20)
GFR: 113.36 mL/min (ref >60.00)
Glucose, Bld: 95 mg/dL (ref 70–99)
POTASSIUM: 4.1 meq/L (ref 3.5–5.1)
SODIUM: 133 meq/L — AB (ref 135–145)
TOTAL PROTEIN: 7.5 g/dL (ref 6.0–8.3)
Total Bilirubin: 0.9 mg/dL (ref 0.2–1.2)

## 2014-11-21 LAB — CBC WITH DIFFERENTIAL/PLATELET
BASOS PCT: 0.5 % (ref 0.0–3.0)
Basophils Absolute: 0 10*3/uL (ref 0.0–0.1)
Eosinophils Absolute: 0.2 10*3/uL (ref 0.0–0.7)
Eosinophils Relative: 2.3 % (ref 0.0–5.0)
HEMATOCRIT: 38.8 % (ref 36.0–46.0)
HEMOGLOBIN: 13.7 g/dL (ref 12.0–15.0)
Lymphocytes Relative: 32.9 % (ref 12.0–46.0)
Lymphs Abs: 2.6 10*3/uL (ref 0.7–4.0)
MCHC: 35.3 g/dL (ref 30.0–36.0)
MCV: 89.1 fl (ref 78.0–100.0)
MONO ABS: 0.5 10*3/uL (ref 0.1–1.0)
Monocytes Relative: 6.4 % (ref 3.0–12.0)
NEUTROS PCT: 57.9 % (ref 43.0–77.0)
Neutro Abs: 4.6 10*3/uL (ref 1.4–7.7)
Platelets: 176 10*3/uL (ref 150.0–400.0)
RBC: 4.35 Mil/uL (ref 3.87–5.11)
RDW: 13.6 % (ref 11.5–15.5)
WBC: 7.9 10*3/uL (ref 4.0–10.5)

## 2014-11-21 LAB — IBC PANEL
Iron: 108 ug/dL (ref 42–145)
Saturation Ratios: 25.9 % (ref 20.0–50.0)
Transferrin: 298 mg/dL (ref 212.0–360.0)

## 2014-11-21 LAB — AMYLASE: Amylase: 39 U/L (ref 27–131)

## 2014-11-21 LAB — IGA: IgA: 417 mg/dL — ABNORMAL HIGH (ref 68–378)

## 2014-11-21 LAB — LIPASE: LIPASE: 17 U/L (ref 11.0–59.0)

## 2014-11-21 LAB — FERRITIN: Ferritin: 24.9 ng/mL (ref 10.0–291.0)

## 2014-11-21 MED ORDER — PANTOPRAZOLE SODIUM 40 MG PO TBEC
40.0000 mg | DELAYED_RELEASE_TABLET | Freq: Every day | ORAL | Status: DC
Start: 2014-11-21 — End: 2015-03-17

## 2014-11-21 MED ORDER — NA SULFATE-K SULFATE-MG SULF 17.5-3.13-1.6 GM/177ML PO SOLN: 1.0000 | Freq: Once | ORAL | Status: DC

## 2014-11-21 NOTE — Patient Instructions (Signed)
You have been scheduled for a Barium Esophogram at [facility] (1st floor of the hospital) on 11/29/2014 at 9:15am. Please arrive 15 minutes prior to your appointment for registration. Make certain not to have anything to eat or drink 6 hours prior to your test. If you need to reschedule for any reason, please contact radiology at [phone] to do so. __________________________________________________________________ A barium swallow is an examination that concentrates on views of the esophagus. This tends to be a double contrast exam (barium and two liquids which, when combined, create a gas to distend the wall of the oesophagus) or single contrast (non-ionic iodine based). The study is usually tailored to your symptoms so a good history is essential. Attention is paid during the study to the form, structure and configuration of the esophagus, looking for functional disorders (such as aspiration, dysphagia, achalasia, motility and reflux) EXAMINATION You may be asked to change into a gown, depending on the type of swallow being performed. A radiologist and radiographer will perform the procedure. The radiologist will advise you of the type of contrast selected for your procedure and direct you during the exam. You will be asked to stand, sit or lie in several different positions and to hold a small amount of fluid in your mouth before being asked to swallow while the imaging is performed .In some instances you may be asked to swallow barium coated marshmallows to assess the motility of a solid food bolus. The exam can be recorded as a digital or video fluoroscopy procedure. POST PROCEDURE It will take 1-2 days for the barium to pass through your system. To facilitate this, it is important, unless otherwise directed, to increase your fluids for the next 24-48hrs and to resume your normal diet.  This test typically takes about 30 minutes to  perform. __________________________________________________________________________________   Bonita QuinYou have been scheduled for an abdominal ultrasound at [facility] (1st floor of hospital) on 11/29/2014 at 9:15am. Please arrive 15 minutes prior to your appointment for registration. Make certain not to have anything to eat or drink 6 hours prior to your appointment. Should you need to reschedule your appointment, please contact radiology at [phone]. This test typically takes about 30 minutes to perform.  Go to the basement for labs  You have been scheduled for an endoscopy and colonoscopy. Please follow the written instructions given to you at your visit today. Please pick up your prep supplies at the pharmacy within the next 1-3 days. If you use inhalers (even only as needed), please bring them with you on the day of your procedure. Your physician has requested that you go to www.startemmi.com and enter the access code given to you at your visit today. This web site gives a general overview about your procedure. However, you should still follow specific instructions given to you by our office regarding your preparation for the procedure.

## 2014-11-21 NOTE — Progress Notes (Signed)
Patient ID: Meghan Park, female   DOB: 04/07/1971, 44 y.o.   MRN: [id number]     History of Present Illness: Meghan Park  Is a delightful 44 year old female with a history of diabetes, fatty liver, status post laparoscopic cholecystectomy in 4098 complicated by a bile duct leak that was treated with endoscopic stent placement. She was last seen in January 2015 at which time she was sent for a gastric emptying scan which she had on 09/13/2013 and was normal.  Patient is here today because she states about 5 or 6 weeks ago her husband had "a stomach bug" with nausea and diarrhea. Several days after his symptoms started she started with the same symptoms. He got better, she continues to have nausea, vomiting and diarrhea. She vomits bile on a daily basis, usually first thing in the morning. She is having 3-5 loose bowel movements daily with no bright red blood per rectum, but she states her stools have occasionally been very dark. She states her stools look oily and have a very strong odor. She was seen by her primary care provider and a stool for C. difficile GH test was done and was positive but then a C. difficile PCR was ordered. Patient states she did not know the second test for C. difficile was ordered so she never dropped off a stool test. She was given a trial of fiber Z which she states caused severe abdominal pain so she discontinued it. She has lost 50-55 pounds intentionally over the past 10-12 months. She states she was scared of the prospect of having to start insulin and so she eliminated carbohydrates and sodas from her diet and await "fell off". Patient is also been under an inordinate amount of stress over the past year caring for her ill father who recently passed away 5 weeks ago.  She also complains that she has had difficulty swallowing meats and dry foods like bread or rice for the past 4-5 months. She has no dysphagia to liquids. She has to stop eating and let her food passed, or  she can sometimes drink a small amount of fluids to help her food go down. She also has an occasional globus sensation that tends to occur if she drinks something very cold. She has also been having epigastric pain radiating into the left upper quadrant., Surprisingly, she says this pain is reminiscent of the pain she had around the time of her ERCP. She has a history of iron deficiency anemia and had a hysterectomy in 2012. She states that she has been feeling very fatigued recently and is unable to do as many activities says she used to because she gets so tired. She has no chest pain or shortness of breath. She also states that she has been B-12 deficiency and was recently started on B-12 nasal spray. Her mother had colon polyps in her early 75s. Patient has not had a colonoscopy.   Past Medical History  Diagnosis Date  . DM (diabetes mellitus), type 2, uncontrolled   . Anxiety associated with depression   . Hip pain, right   . Depression   . Anxiety   . Pneumonia hx  . Cystitis   . Cholelithiasis   . Fatty liver   . Arthritis hip, right  . Insomnia   . Hypertension   . Nausea   . Anal fissure   . Chronic headaches   . HLD (hyperlipidemia)   . Pneumonia   . Diabetes mellitus, type II  Past Surgical History  Procedure Laterality Date  . Abdominal hysterectomy  2012  . Cholecystectomy  04/13/12  . Ercp  04/24/2012    Procedure: ENDOSCOPIC RETROGRADE CHOLANGIOPANCREATOGRAPHY (ERCP);  Surgeon: Beryle Beams, MD;  Location: [facility];  Service: Endoscopy;  Laterality: N/A;  . Tubal ligation    . Ercp  06/02/2012    Procedure: ENDOSCOPIC RETROGRADE CHOLANGIOPANCREATOGRAPHY (ERCP);  Surgeon: Beryle Beams, MD;  Location: [facility];  Service: Endoscopy;  Laterality: N/A;  . Gallbladder surgery  2013  . Cesarean section  [id number],[id number]    x2   Family History  Problem Relation Age of Onset  . Anxiety disorder Mother   . Hyperlipidemia Mother   . Irritable bowel syndrome Mother     . Arthritis Father   . COPD Father   . Heart disease Father   . Heart disease Maternal Grandfather   . Congestive Heart Failure Maternal Grandmother   . Heart disease Maternal Grandmother   . Heart disease Paternal Grandfather   . Breast cancer Paternal Grandmother     great  . Diabetes Maternal Aunt   . Anxiety disorder Son     social anxiety  . Alcohol abuse Maternal Uncle    History  Substance Use Topics  . Smoking status: Current Every Day Smoker -- 1.00 packs/day for 23 years    Types: Cigarettes  . Smokeless tobacco: Never Used  . Alcohol Use: No     Comment: quit 18 yrs ago   Current Outpatient Prescriptions  Medication Sig Dispense Refill  . clonazePAM (KLONOPIN) 1 MG tablet TAKE 1/2 TO 1 TABLET BY MOUTH EVERY DAY AS NEEDED FOR ANXIETY 30 tablet 5  . Cyanocobalamin 500 MCG/0.1ML SOLN Place 0.1 mLs (500 mcg total) into the nose once a week. 2.3 mL 11  . cyclobenzaprine (FLEXERIL) 10 MG tablet TAKE 1 TABLET BY MOUTH AT BEDTIME AS NEEDED FOR MUSCLE SPASMS 30 tablet 11  . ibuprofen (ADVIL,MOTRIN) 200 MG tablet Take 400 mg by mouth every 4 (four) hours as needed. For pain    . lisinopril (PRINIVIL,ZESTRIL) 10 MG tablet TAKE 1 TABLET BY MOUTH DAILY 90 tablet 0  . NON FORMULARY Place rectally QID. Nitroglycerin oitment 0.125    . ondansetron (ZOFRAN-ODT) 8 MG disintegrating tablet DISSOLVE ONE TABLET BY MOUTH EVERY 8 HOURS AS NEEDED FOR NAUSEA 30 tablet 0  . promethazine (PHENERGAN) 25 MG tablet Take 0.5-1 tablets (12.5-25 mg total) by mouth every 8 (eight) hours as needed for nausea or vomiting. 20 tablet 0  . sertraline (ZOLOFT) 100 MG tablet Take 1 tablet (100 mg total) by mouth daily. 30 tablet 1  . Na Sulfate-K Sulfate-Mg Sulf (SUPREP BOWEL PREP) SOLN Take 1 kit by mouth once. 1 Bottle 0  . pantoprazole (PROTONIX) 40 MG tablet Take 1 tablet (40 mg total) by mouth daily. 30 MINUTES BEFORE BREAKFAST 30 tablet 3   No current facility-administered medications for this visit.    Allergies  Allergen Reactions  . Januvia [Sitagliptin]     GI upset  . Morphine And Related Dermatitis and Other (See Comments)    Severe itching leading to welts and open wounds.  Wilder Glade [Dapagliflozin]     yeast      Review of Systems: Gen: Denies any fever, chills, sweats, anorexia,  malaise, weight loss, and sleep disorder. Feels fatigued. CV: Denies chest pain, angina, palpitations, syncope, orthopnea, PND, peripheral edema, and claudication. Resp: Denies dyspnea at rest, dyspnea with exercise, cough, sputum, wheezing, coughing up blood, and  pleurisy. GI: Denies vomiting blood, jaundice, and fecal incontinence.   Has dysphagia to solids. Has diarrhea 5 weeks. GU : Denies urinary burning, blood in urine, urinary frequency, urinary hesitancy, nocturnal urination, and urinary incontinence. MS: Denies joint pain, limitation of movement, and swelling, stiffness, low back pain, extremity pain. Denies muscle weakness, cramps, atrophy.  Derm: Denies rash, itching, dry skin, hives, moles, warts, or unhealing ulcers.  Psych: Denies depression, anxiety, memory loss, suicidal ideation, hallucinations, paranoia, and confusion. Heme: Denies bruising, bleeding, and enlarged lymph nodes. Neuro:  Denies any headaches, dizziness, paresthesia Endo:  Denies any problems with DM, thyroid, adrenal  LAB RESULTS: Fecal lactoferrin on 11/05/2014 was negative. C. difficile G D H on 11/05/2014 was detected CBC on 10/30/2014 white blood cell 11.7, hemoglobin 14.1, hematocrit 40.9, platelets 220,000. MCV 90.9. Vitamin B-12 on 10/30/2014 211. Folate on 10/30/2014  12.6. Ferritin on 10/30/2014 was 19.8. Ferritin 11 months ago was 9.6.   Physical Exam: General: Pleasant, well developed  female in no acute distress Head: Normocephalic and atraumatic Eyes:  sclerae anicteric, conjunctiva pink  Ears: Normal auditory acuity Lungs: Clear throughout to auscultation Heart: Regular rate and  rhythm Abdomen: Soft, non distended, non-tender. No masses, no hepatomegaly. Normal bowel sounds Rectal: soft brown stool guiac positive Musculoskeletal: Symmetrical with no gross deformities  Extremities: No edema  Neurological: Alert oriented x 4, grossly nonfocal Psychological:  Alert and cooperative. Normal mood and affect  Assessment and Recommendations: #1. Dysphagia. This may be due to some poorly controlled reflux. An antireflux regimen has been reviewed. She will be given a trial of pantoprazole 40 mg by mouth every morning 30 minutes prior to breakfast. She will be scheduled for a barium swallow with tablet. He will then be scheduled for an EGD with possible dilation.The risks, benefits, and alternatives to endoscopy with possible biopsy and possible dilation were discussed with the patient and they consent to proceed.    . #2. Nausea and epigastric pain. He is may be due to #1. A comprehensive metabolic panel, amylase, and lipase will be obtained. An abdominal ultrasound will be obtained to evaluate for possible recurrent ductal dilation etc.  #3. Diarrhea and heme positive stools. GI pathogen panel will be obtained along with a pancreatic fecal elastase. She will be scheduled for a colonoscopy to evaluate for polyps, neoplasia, IBD, microscopic colitis etc.The risks, benefits, and alternatives to colonoscopy with possible biopsy and possible polypectomy were discussed with the patient and they consent to proceed.  Her procedures will be scheduled with Dr. Deatra Ina at the same setting as the patient has minimal time off left at work and cannot take more than 1 day off from work.  #4. Iron deficiency anemia. Patient had an abdominal hysterectomy in 2012. She reports that she has been troubled with her iron deficiency for almost 2 years. An IgA and TTG will be obtained to evaluate for possible celiac. A CBC with differential, and iron studies will be obtained as well. Colonoscopy and EGD is  noted above will help determine a cause for her heme positive stools and iron deficiency anemia.  Further recommendations will be made pending the findings of the above.        Hvozdovic, Deloris Ping 11/21/2014,

## 2014-11-22 NOTE — Progress Notes (Signed)
Reviewed and agree with management. Gianny Killman D. Fannie Gathright, M.D., FACG  

## 2014-11-25 ENCOUNTER — Other Ambulatory Visit: Payer: Self-pay | Admitting: Family

## 2014-11-25 ENCOUNTER — Encounter: Payer: Self-pay | Admitting: Gastroenterology

## 2014-11-25 ENCOUNTER — Other Ambulatory Visit: Payer: Managed Care, Other (non HMO)

## 2014-11-25 DIAGNOSIS — D509 Iron deficiency anemia, unspecified: Secondary | ICD-10-CM

## 2014-11-25 DIAGNOSIS — R1013 Epigastric pain: Secondary | ICD-10-CM

## 2014-11-25 DIAGNOSIS — R1310 Dysphagia, unspecified: Secondary | ICD-10-CM

## 2014-11-25 DIAGNOSIS — R197 Diarrhea, unspecified: Secondary | ICD-10-CM

## 2014-11-25 DIAGNOSIS — R1114 Bilious vomiting: Secondary | ICD-10-CM

## 2014-11-25 DIAGNOSIS — R195 Other fecal abnormalities: Secondary | ICD-10-CM

## 2014-11-25 LAB — TISSUE TRANSGLUTAMINASE, IGA: Tissue Transglutaminase Ab, IgA: 1 U/mL (ref <4)

## 2014-11-26 ENCOUNTER — Ambulatory Visit: Payer: Self-pay | Admitting: Nurse Practitioner

## 2014-11-27 LAB — GASTROINTESTINAL PATHOGEN PANEL PCR
C. difficile Tox A/B, PCR: NEGATIVE
Campylobacter, PCR: NEGATIVE
Cryptosporidium, PCR: NEGATIVE
E coli (ETEC) LT/ST PCR: NEGATIVE
E coli (STEC) stx1/stx2, PCR: NEGATIVE
E coli 0157, PCR: NEGATIVE
Giardia lamblia, PCR: NEGATIVE
Norovirus, PCR: NEGATIVE
Rotavirus A, PCR: NEGATIVE
Salmonella, PCR: NEGATIVE
Shigella, PCR: NEGATIVE

## 2014-11-29 ENCOUNTER — Ambulatory Visit (HOSPITAL_COMMUNITY)
Admission: RE | Admit: 2014-11-29 | Discharge: 2014-11-29 | Disposition: A | Discharge status: Home / Self Care | Payer: Managed Care, Other (non HMO) | Source: Ambulatory Visit | Attending: Physician Assistant | Admitting: Physician Assistant

## 2014-11-29 DIAGNOSIS — R1114 Bilious vomiting: Secondary | ICD-10-CM

## 2014-11-29 DIAGNOSIS — R1310 Dysphagia, unspecified: Secondary | ICD-10-CM | POA: Diagnosis not present

## 2014-11-29 DIAGNOSIS — R1013 Epigastric pain: Secondary | ICD-10-CM

## 2014-11-29 DIAGNOSIS — R112 Nausea with vomiting, unspecified: Secondary | ICD-10-CM | POA: Insufficient documentation

## 2014-11-29 DIAGNOSIS — R109 Unspecified abdominal pain: Secondary | ICD-10-CM | POA: Diagnosis not present

## 2014-11-29 DIAGNOSIS — R197 Diarrhea, unspecified: Secondary | ICD-10-CM | POA: Insufficient documentation

## 2014-11-29 DIAGNOSIS — D509 Iron deficiency anemia, unspecified: Secondary | ICD-10-CM

## 2014-11-29 DIAGNOSIS — R195 Other fecal abnormalities: Secondary | ICD-10-CM

## 2014-12-02 ENCOUNTER — Telehealth: Payer: Self-pay | Admitting: *Deleted

## 2014-12-02 ENCOUNTER — Encounter: Payer: Self-pay | Admitting: Physician Assistant

## 2014-12-02 ENCOUNTER — Other Ambulatory Visit: Payer: Self-pay | Admitting: *Deleted

## 2014-12-02 MED ORDER — DICYCLOMINE HCL 20 MG PO TABS: ORAL_TABLET | ORAL | Status: DC

## 2014-12-02 NOTE — Telephone Encounter (Signed)
Patient aware of appointment time changes for her double procedure on 5/17   Changed to 2:30pm from 7:30am   Due to Dr Fannie KneeKaplans quartley meeting

## 2014-12-02 NOTE — Telephone Encounter (Signed)
Rx sent to pharmacy. Sent message to patient vis My Chart.

## 2014-12-02 NOTE — Telephone Encounter (Signed)
Please check if the c dii she dropped off on the 6th is back. Can use bentyl 20 mg tid prn.     ----- Message -----     From: Louis Matteammy L Kamphuis     Sent: 12/02/2014 11:41 AM      To: Tollie PizzaLori P Hvozdovic, PA-C    Subject: Non-Urgent Medical Question                 ----- Message from Daphine Deutscheregina N Fletcher Rathbun, RN sent at 12/02/2014 11:41 AM EDT -----            ----- Message from Meghan Park to Tollie PizzaLori P Hvozdovic, PA-C sent at 12/02/2014 10:51 AM -----     Good morning. I am having more pain in my abdomen along with the nausea. I have my colonoscopy and endoscopy scheduled next Tuesday. What can I take for the pain? I do not want to aggravate it and since there is blood I don't want to take advil.        Thank you.          Select Font Size     Small Medium Large Extra Extra Large    Meghan Park  12/02/2014  Patient Email  MRN:  [id number]     Patient had a negative GI pathogen panel on 11/25/14.

## 2014-12-04 LAB — PANCREATIC ELASTASE, FECAL: Pancreatic Elastase-1, Stool: >500 mcg/g

## 2014-12-09 ENCOUNTER — Other Ambulatory Visit (HOSPITAL_COMMUNITY): Payer: Self-pay | Admitting: Psychiatry

## 2014-12-09 ENCOUNTER — Other Ambulatory Visit: Payer: Self-pay | Admitting: Family

## 2014-12-10 ENCOUNTER — Encounter: Payer: Self-pay | Admitting: Gastroenterology

## 2014-12-10 ENCOUNTER — Other Ambulatory Visit: Payer: Self-pay

## 2014-12-10 ENCOUNTER — Ambulatory Visit (AMBULATORY_SURGERY_CENTER): Payer: Managed Care, Other (non HMO) | Admitting: Gastroenterology

## 2014-12-10 ENCOUNTER — Ambulatory Visit (HOSPITAL_COMMUNITY): Payer: Self-pay | Admitting: Psychiatry

## 2014-12-10 ENCOUNTER — Other Ambulatory Visit: Payer: Self-pay | Admitting: Gastroenterology

## 2014-12-10 VITALS — BP 118/74 | HR 86 | Temp 98.7°F | Resp 17 | Ht 63.0 in | Wt 159.0 lb

## 2014-12-10 DIAGNOSIS — R197 Diarrhea, unspecified: Secondary | ICD-10-CM | POA: Diagnosis present

## 2014-12-10 DIAGNOSIS — R1310 Dysphagia, unspecified: Secondary | ICD-10-CM

## 2014-12-10 MED ORDER — SODIUM CHLORIDE 0.9 % IV SOLN: 500.0000 mL | INTRAVENOUS | Status: DC

## 2014-12-10 MED ORDER — METOCLOPRAMIDE HCL 5 MG PO TABS: 10.0000 mg | ORAL_TABLET | Freq: Three times a day (TID) | ORAL | Status: DC

## 2014-12-10 NOTE — Patient Instructions (Addendum)
Impression/recommendations:  Normal colonoscopy  Repeat colonoscopy in 10 years.  Biopsies taken of stomach, await results.  Hemoccult cards to be completed in 10 days.  Metoclopramide 10 mg one half hour before meals and at bedtime.  YOU HAD AN ENDOSCOPIC PROCEDURE TODAY AT THE [facility]:   Refer to the procedure report that was given to you for any specific questions about what was found during the examination.  If the procedure report does not answer your questions, please call your gastroenterologist to clarify.  If you requested that your care partner not be given the details of your procedure findings, then the procedure report has been included in a sealed envelope for you to review at your convenience later.  YOU SHOULD EXPECT: Some feelings of bloating in the abdomen. Passage of more gas than usual.  Walking can help get rid of the air that was put into your GI tract during the procedure and reduce the bloating. If you had a lower endoscopy (such as a colonoscopy or flexible sigmoidoscopy) you may notice spotting of blood in your stool or on the toilet paper. If you underwent a bowel prep for your procedure, you may not have a normal bowel movement for a few days.  Please Note:  You might notice some irritation and congestion in your nose or some drainage.  This is from the oxygen used during your procedure.  There is no need for concern and it should clear up in a day or so.  SYMPTOMS TO REPORT IMMEDIATELY:   Following lower endoscopy (colonoscopy or flexible sigmoidoscopy):  Excessive amounts of blood in the stool  Significant tenderness or worsening of abdominal pains  Swelling of the abdomen that is new, acute  Fever of 100F or higher   Following upper endoscopy (EGD)  Vomiting of blood or coffee ground material  New chest pain or pain under the shoulder blades  Painful or persistently difficult swallowing  New shortness of breath  Fever of 100F or  higher  Black, tarry-looking stools  For urgent or emergent issues, a gastroenterologist can be reached at any hour by calling [phone].   DIET: Your first meal following the procedure should be a small meal and then it is ok to progress to your normal diet. Heavy or fried foods are harder to digest and may make you feel nauseous or bloated.  Likewise, meals heavy in dairy and vegetables can increase bloating.  Drink plenty of fluids but you should avoid alcoholic beverages for 24 hours.  ACTIVITY:  You should plan to take it easy for the rest of today and you should NOT DRIVE or use heavy machinery until tomorrow (because of the sedation medicines used during the test).    FOLLOW UP: Our staff will call the number listed on your records the next business day following your procedure to check on you and address any questions or concerns that you may have regarding the information given to you following your procedure. If we do not reach you, we will leave a message.  However, if you are feeling well and you are not experiencing any problems, there is no need to return our call.  We will assume that you have returned to your regular daily activities without incident.  If any biopsies were taken you will be contacted by phone or by letter within the next 1-3 weeks.  Please call us at [phone] if you have not heard about the biopsies in 3 weeks.  SIGNATURES/CONFIDENTIALITY: You and/or your care partner have signed paperwork which will be entered into your electronic medical record.  These signatures attest to the fact that that the information above on your After Visit Summary has been reviewed and is understood.  Full responsibility of the confidentiality of this discharge information lies with you and/or your care-partner.

## 2014-12-10 NOTE — Progress Notes (Signed)
Called to room to assist during endoscopic procedure.  Patient ID and intended procedure confirmed with present staff. Received instructions for my participation in the procedure from the performing physician.  

## 2014-12-10 NOTE — Op Note (Signed)
[facility] [address] [place] [id number]   COLONOSCOPY PROCEDURE REPORT  PATIENT: Meghan Park, Meghan Park  MR#: [id number] BIRTHDATE: Feb 24, 1971 , 43  yrs. old GENDER: female ENDOSCOPIST: Meghan Meckelobert D Natanael Saladin, MD REFERRED XB:JYNWGNBY:Thomas Karsten RoL Jones, M.D. PROCEDURE DATE:  12/10/2014 PROCEDURE:   Colonoscopy, diagnostic First Screening Colonoscopy - Avg.  risk and is 50 yrs.  old or older Yes.  Prior Negative Screening - Now for repeat screening. N/A  History of Adenoma - Now for follow-up colonoscopy & has been > or = to 3 yrs.  N/A  Recommend repeat exam, <10 yrs? No ASA CLASS:   Class II INDICATIONS:Colorectal Neoplasm Risk Assessment for this procedure is average risk and heme-positive stool. MEDICATIONS: Monitored anesthesia care and Propofol 250 mg IV  DESCRIPTION OF PROCEDURE:   After the risks benefits and alternatives of the procedure were thoroughly explained, informed consent was obtained.  The digital rectal exam revealed no abnormalities of the rectum.   The LB [id number] Loaner [id number] endoscope was introduced through the anus and advanced to the ileum. No adverse events experienced.   The quality of the prep was (Suprep was used) excellent.  The instrument was then slowly withdrawn as the colon was fully examined. Estimated blood loss is zero unless otherwise noted in this procedure report.      COLON FINDINGS: A normal appearing cecum, ileocecal valve, and appendiceal orifice were identified.  The ascending, transverse, descending, sigmoid colon, and rectum appeared unremarkable. Retroflexed views revealed no abnormalities. The time to cecum = 2.2 Withdrawal time = 7.6   The scope was withdrawn and the procedure completed. COMPLICATIONS: There were no immediate complications.  ENDOSCOPIC IMPRESSION: Normal colonoscopy  RECOMMENDATIONS: Continue current colorectal screening recommendations for "routine risk" patients with a repeat colonoscopy in 10  years.  eSigned:  Louis Meckelobert D Amanee Iacovelli, MD 12/10/2014 2:56 PM   cc:

## 2014-12-10 NOTE — Progress Notes (Signed)
   Endoscopy Center Anesthesia Post-op Note  Patient: Meghan Park  Procedure(s) Performed: colonoscopy and endoscopy  Patient Location: [facility] - Recovery Area  Anesthesia Type: Deep Sedation/Propofol  Level of Consciousness: awake, oriented and patient cooperative  Airway and Oxygen Therapy: Patient Spontanous Breathing  Post-op Pain: none  Post-op Assessment:  Post-op Vital signs reviewed, Patient's Cardiovascular Status Stable, Respiratory Function Stable, Patent Airway, No signs of Nausea or vomiting and Pain level controlled  Post-op Vital Signs: Reviewed and stable  Complications: No apparent anesthesia complications  Linzey Ramser E 2:52 PM

## 2014-12-11 ENCOUNTER — Telehealth: Payer: Self-pay | Admitting: *Deleted

## 2014-12-11 NOTE — Telephone Encounter (Signed)
  Follow up Call-  Call back number 12/10/2014  Post procedure Call Back phone  # [phone]  Permission to leave phone message Yes     Patient questions:  Do you have a fever, pain , or abdominal swelling? No. Pain Score  0 *  Have you tolerated food without any problems? Yes.    Have you been able to return to your normal activities? Yes.    Do you have any questions about your discharge instructions: Diet   No. Medications  No. Follow up visit  No.  Do you have questions or concerns about your Care? No.  Actions: * If pain score is 4 or above: No action needed, pain <4.

## 2014-12-12 ENCOUNTER — Ambulatory Visit (HOSPITAL_COMMUNITY): Payer: Self-pay | Admitting: Psychiatry

## 2014-12-16 ENCOUNTER — Telehealth (HOSPITAL_COMMUNITY): Payer: Self-pay

## 2014-12-16 ENCOUNTER — Encounter: Payer: Self-pay | Admitting: Gastroenterology

## 2014-12-16 DIAGNOSIS — F331 Major depressive disorder, recurrent, moderate: Secondary | ICD-10-CM

## 2014-12-16 NOTE — Telephone Encounter (Signed)
Telephone call from patient requesting a least a few days of authorized refill of Sertraline.  States she had to cancel her appointments from 12/10/14 and 12/12/14 due to problems with a colonoscopy she underwent that week.  Patient reported she is scheduled for 12/19/14 with Dr. Michae KavaAgarwal but will run out of medication after her dosage this date and would like at least a few days of medication until seen.

## 2014-12-17 MED ORDER — SERTRALINE HCL 100 MG PO TABS: ORAL_TABLET | ORAL | Status: DC

## 2014-12-17 NOTE — Telephone Encounter (Signed)
Yes we can give her enough to get to her appt.

## 2014-12-17 NOTE — Telephone Encounter (Signed)
Telephone message left for patient that her order for Sertraline to last until appointment 12/19/14 had been sent to her pharmacy as requested.

## 2014-12-17 NOTE — Op Note (Signed)
[facility] [address] [place], [id number]   ENDOSCOPY PROCEDURE REPORT  PATIENT: Louis MatteCapps, Meghan L  MR#: [id number] BIRTHDATE: [birth year] , 43  yrs. old GENDER: female ENDOSCOPIST: Louis Meckelobert D Kaplan, MD REFERRED BY:  Etta Grandchildhomas L Jones, M.D. PROCEDURE DATE:  12/10/2014 PROCEDURE:  EGD, diagnostic and EGD w/ biopsy ASA CLASS:     Class II INDICATIONS:  nausea and vomiting. MEDICATIONS: Residual sedation present, Monitored anesthesia care, and Propofol 100 mg IV TOPICAL ANESTHETIC:  DESCRIPTION OF PROCEDURE: After the risks benefits and alternatives of the procedure were thoroughly explained, informed consent was obtained.  The LB [id number] endoscope was introduced through the mouth and advanced to the second portion of the duodenum , Without limitations.  The instrument was slowly withdrawn as the mucosa was fully examined.    STOMACH: A 18 x 5mm patch of abnormal mucosa was found on the anterior wall of the stomach and in the gastric body.  The mucosa was erythematous and edematous.  Enlarged but soft folds.  Multiple biopsies were performed.   Except for the findings listed, the EGD was otherwise normal.         The scope was then withdrawn from the patient and the procedure completed.  COMPLICATIONS: There were no immediate complications.  ENDOSCOPIC IMPRESSION: 1.   18 x 5mm abnormal mucosa was found on the anterior wall of the stomach and in the gastric body; The mucosa was erythematous and edematous; multiple biopsies were performed 2.   EGD was otherwise normal  RECOMMENDATIONS: 1.  Await biopsy results 2.  Empiric trial of metoclopramide 10 mg one half hour before meals and at bedtime Office visit 4 weeks  REPEAT EXAM:  eSigned:  Louis Meckelobert D Kaplan, MD 12/17/2014 9:26 PM Revised: 12/17/2014 9:26 PM   CC:

## 2014-12-18 ENCOUNTER — Encounter: Payer: Self-pay | Admitting: Gastroenterology

## 2014-12-18 ENCOUNTER — Encounter: Payer: Self-pay | Admitting: Internal Medicine

## 2014-12-18 ENCOUNTER — Other Ambulatory Visit: Payer: Self-pay

## 2014-12-18 MED ORDER — AMOXICILL-CLARITHRO-LANSOPRAZ PO MISC: Freq: Two times a day (BID) | ORAL | Status: DC

## 2014-12-19 ENCOUNTER — Ambulatory Visit (INDEPENDENT_AMBULATORY_CARE_PROVIDER_SITE_OTHER): Payer: Managed Care, Other (non HMO) | Admitting: Psychiatry

## 2014-12-19 ENCOUNTER — Encounter (HOSPITAL_COMMUNITY): Payer: Self-pay | Admitting: Psychiatry

## 2014-12-19 VITALS — BP 112/77 | HR 104 | Ht 62.0 in | Wt 168.6 lb

## 2014-12-19 DIAGNOSIS — F411 Generalized anxiety disorder: Secondary | ICD-10-CM | POA: Diagnosis not present

## 2014-12-19 DIAGNOSIS — F331 Major depressive disorder, recurrent, moderate: Secondary | ICD-10-CM | POA: Diagnosis not present

## 2014-12-19 MED ORDER — SERTRALINE HCL 100 MG PO TABS: 150.0000 mg | ORAL_TABLET | Freq: Every day | ORAL | Status: DC

## 2014-12-19 MED ORDER — CLONAZEPAM 1 MG PO TABS: ORAL_TABLET | ORAL | Status: DC

## 2014-12-19 MED ORDER — HYDROXYZINE PAMOATE 25 MG PO CAPS: 75.0000 mg | ORAL_CAPSULE | Freq: Every evening | ORAL | Status: DC | PRN
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
## 2014-12-24 ENCOUNTER — Other Ambulatory Visit: Payer: Self-pay | Admitting: Family

## 2014-12-24 LAB — HM COLONOSCOPY

## 2014-12-30 ENCOUNTER — Telehealth: Payer: Self-pay | Admitting: Gastroenterology

## 2014-12-30 NOTE — Telephone Encounter (Signed)
Patient states she call the after hours doctor. The abdominal pain was waking her up from her sleep. She feels very bloated like her stomach is swollen. Reports she was told to stop the atb's she was put on to treat her positive H-pylori biopsy. She is calling asking for an appointment. She denies bm changes, fever or vomiting. Her follow up appointment is 01/08/15. She doesn't feel she can wait that long. Apointment scheduled for tomorrow with Amy Esterwood at 9:00 am.

## 2014-12-31 ENCOUNTER — Encounter: Payer: Self-pay | Admitting: Physician Assistant

## 2014-12-31 ENCOUNTER — Ambulatory Visit (INDEPENDENT_AMBULATORY_CARE_PROVIDER_SITE_OTHER): Payer: Managed Care, Other (non HMO) | Admitting: Physician Assistant

## 2014-12-31 VITALS — BP 98/64 | HR 108 | Ht 63.0 in | Wt 168.1 lb

## 2014-12-31 DIAGNOSIS — K589 Irritable bowel syndrome without diarrhea: Secondary | ICD-10-CM | POA: Diagnosis not present

## 2014-12-31 DIAGNOSIS — R1012 Left upper quadrant pain: Secondary | ICD-10-CM | POA: Diagnosis not present

## 2014-12-31 DIAGNOSIS — R197 Diarrhea, unspecified: Secondary | ICD-10-CM | POA: Diagnosis not present

## 2014-12-31 MED ORDER — HYOSCYAMINE SULFATE 0.125 MG SL SUBL: 0.1250 mg | SUBLINGUAL_TABLET | Freq: Four times a day (QID) | SUBLINGUAL | Status: DC | PRN

## 2014-12-31 MED ORDER — SACCHAROMYCES BOULARDII 250 MG PO CAPS: 250.0000 mg | ORAL_CAPSULE | Freq: Two times a day (BID) | ORAL | Status: DC

## 2014-12-31 NOTE — Progress Notes (Signed)
Patient ID: Meghan Park, female   DOB: 06-14-1971, 44 y.o.   MRN: [id number]   Subjective:    Patient ID: Meghan Matteammy L Sladek, female    DOB: 06-14-1971, 44 y.o.   MRN: [id number]  HPI  Destyne is a pleasant 44 year old white female known to Dr. Arlyce DiceKaplan. She has history of generalized anxiety disorder, major depression, adult-onset diabetes mellitus, peripheral neuropathy, hypertension, and IBS. She status post lap Scott a cholecystectomy in 2013 that was complicated by a bile leak. She had recently been seen in April with complaints of diarrhea nausea and vomiting and ultimately underwent a colonoscopy and upper endoscopy on 12/10/2014. Colonoscopy was negative and EGD showed an abnormal gastric fold which appeared inflamed this was biopsies and biopsies positive for gastritis and H. pylori. She had been given a trial of Reglan at the time of the EGD and then has just completed a Prevpac. She says she did feel worse while taking the Prevpac and last week called because she was so nauseated. She completed 10 days of the Prevpac and then stopped. She says the nausea is improving at this point She continues to complain of diarrhea and says she hardly ever has a normal bowel movement. Generally her stools are loose and 4-5 times per day. Has been waking up at night intermittently with sharp pains on her left side primarily in the left upper quadrant. She says typically last 5 minutes and then she has a dull or achy-type pain in the left side. She has been on dicyclomine but generally just uses 1 tablet in the morning. She is also on several psychotropics. He says she's been having a difficult time with anxiety and depression and a technologist that that may be playing a role in her abdominal symptoms.  Review of Systems Pertinent positive and negative review of systems were noted in the above HPI section.  All other review of systems was otherwise negative.  Outpatient Encounter Prescriptions as of 12/31/2014    Medication Sig  . clonazePAM (KLONOPIN) 1 MG tablet Take 1/2 to 1 tab po qD prn anxiety  . Cyanocobalamin 500 MCG/0.1ML SOLN Place 0.1 mLs (500 mcg total) into the nose once a week.  . cyclobenzaprine (FLEXERIL) 10 MG tablet TAKE 1 TABLET BY MOUTH AT BEDTIME AS NEEDED FOR MUSCLE SPASMS  . dicyclomine (BENTYL) 20 MG tablet Take one po TID prn  . hydrOXYzine (VISTARIL) 25 MG capsule Take 3 capsules (75 mg total) by mouth at bedtime as needed.  . metoCLOPramide (REGLAN) 5 MG tablet Take 2 tablets (10 mg total) by mouth 4 (four) times daily -  before meals and at bedtime.  . promethazine (PHENERGAN) 25 MG tablet TAKE 1/2 TO 1 TABLET BY MOUTH EVERY 8 HOURS AS NEEDED FOR NAUSEA OR VOMITING  . sertraline (ZOLOFT) 100 MG tablet Take 1.5 tablets (150 mg total) by mouth daily. TAKE 1 TABLET(100 MG) BY MOUTH DAILY  . hyoscyamine (LEVSIN/SL) 0.125 MG SL tablet Place 1 tablet (0.125 mg total) under the tongue every 6 (six) hours as needed.  . pantoprazole (PROTONIX) 40 MG tablet Take 1 tablet (40 mg total) by mouth daily. 30 MINUTES BEFORE BREAKFAST (Patient not taking: Reported on 12/31/2014)  . saccharomyces boulardii (FLORASTOR) 250 MG capsule Take 1 capsule (250 mg total) by mouth 2 (two) times daily.  . [DISCONTINUED] amoxicillin-clarithromycin-lansoprazole (PREVPAC) combo pack Take by mouth 2 (two) times daily. Follow package directions. Stop Pantoprazole while until completed.  . [DISCONTINUED] ibuprofen (ADVIL,MOTRIN) 200 MG tablet Take 400  mg by mouth every 4 (four) hours as needed. For pain  . [DISCONTINUED] NON FORMULARY Place rectally QID. Nitroglycerin oitment 0.125  . [DISCONTINUED] ondansetron (ZOFRAN-ODT) 8 MG disintegrating tablet DISSOLVE ONE TABLET BY MOUTH EVERY 8 HOURS AS NEEDED FOR NAUSEA (Patient not taking: Reported on 12/19/2014)   No facility-administered encounter medications on file as of 12/31/2014.   Allergies  Allergen Reactions  . Januvia [Sitagliptin]     GI upset  . Morphine  And Related Dermatitis and Other (See Comments)    Severe itching leading to welts and open wounds.  Marcelline Deist [Dapagliflozin]     yeast   Patient Active Problem List   Diagnosis Date Noted  . Leukocytosis 11/07/2014  . Diarrhea 10/30/2014  . MDD (major depressive disorder), recurrent episode, moderate 10/10/2014  . GAD (generalized anxiety disorder) 10/10/2014  . Cigarette nicotine dependence without complication 10/10/2014  . B12 deficiency anemia 01/13/2014  . Anemia, iron deficiency 12/21/2013  . Other screening mammogram 10/01/2013  . Anal fissure 08/20/2013  . Hyperlipidemia with target LDL less than 100 04/06/2013  . Diabetic neuropathy, painful 04/06/2013  . Tobacco abuse 11/09/2012  . Obesity, unspecified 11/09/2012  . HTN (hypertension) 11/09/2012  . Irritable bowel syndrome 11/09/2012  . DM (diabetes mellitus), type 2, uncontrolled 09/05/2011  . Anxiety associated with depression    History   Social History  . Marital Status: Married    Spouse Name: N/A  . Number of Children: 2  . Years of Education: N/A   Occupational History  . coordiantor    Social History Main Topics  . Smoking status: Current Every Day Smoker -- 1.00 packs/day for 23 years    Types: Cigarettes  . Smokeless tobacco: Never Used  . Alcohol Use: No     Comment: quit 18 yrs ago  . Drug Use: No  . Sexual Activity: Yes    Birth Control/ Protection: Other-see comments     Comment: hysterectomy   Other Topics Concern  . Not on file   Social History Narrative   married   Employment: data entry   Science writer daily    Ms. Amberg's family history includes Alcohol abuse in her maternal uncle; Anxiety disorder in her mother and son; Arthritis in her father; Breast cancer in her paternal grandmother; COPD in her father; Congestive Heart Failure in her maternal grandmother; Diabetes in her maternal aunt; Heart disease in her father, maternal grandfather, maternal grandmother, and  paternal grandfather; Hyperlipidemia in her mother; Irritable bowel syndrome in her mother.      Objective:    Filed Vitals:   12/31/14 0857  BP: 98/64  Pulse: 108    Physical Exam  well-developed white female in no acute distress, pleasant blood pressure 98/64 pulse 108 height 5 foot 3 weight 168. HEENT ;nontraumatic normocephalic EOMI PERRLA sclera anicteric, Supple ;no JVD, Cardiovascular ;regular rate and rhythm with S1-S2 no murmur or gallop, Pulmonary ;clear bilaterally, Abdomen ;soft she has mild rather generalized tenderness, mildly tender in the left mid quadrant there is no guarding or rebound no palpable mass or hepatosplenomegaly,BS + Rectal; exam not done, EXt;no clubbing cyanosis or edema skin warm and dry, Psych; mood and affect appropriate       Assessment & Plan:   #1 44 yo female with IBS -D , with associated abdominal  Pain  #2 Hpylori gastritis- just completed Prevpak #3 GERD  #4 chronic anxiety #5 major depression #5 AODM  Plan; Continue protonix 40 mg po daily  Add a daily probiotic -florastor or Culturelle Stop reglan  Stop bentyl  Add levsin sl prn for noctural sxs  Xifaxan 550 tid x 14 days  For IBS-D  follow up as needed -Dr Hans Eden PA-C 12/31/2014   Cc: Etta Grandchild, MD

## 2014-12-31 NOTE — Patient Instructions (Addendum)
You have been given a separate informational sheet regarding your tobacco use, the importance of quitting and local resources to help you quit. [facility] [place]. 1. FLorastor probiotic 2. Levsin SL.   Continue the Pantoprazole sodium ( Protonix) 40 mg.  Stop Reglan Stop Bentyl ( Dicyclomine).   We are faxing a prescription to [facility] for the Xifaxan 550 mg antibiotic. You will be hearing from them. They will run this through your insurance and let you know if it is covered and what you cost would be after filing insurance. If this is acceptable to you , the medication will be mailed to your home.

## 2015-01-01 NOTE — Progress Notes (Signed)
Reviewed and agree with management. Barbette Hairobert D. Arlyce DiceKaplan, M.D., [facility]  Will refer to GI researach for breath test

## 2015-01-02 ENCOUNTER — Telehealth: Payer: Self-pay | Admitting: *Deleted

## 2015-01-02 NOTE — Telephone Encounter (Signed)
Called and spoke to Amy at [facility] regarding the prescription for Xifaxan 550 mg.  She said she has initiated a prior authorization for the Xifaxan 550 mg and she is waiting to hear from the patient's insurance company.  She asid she will call me once she hears.

## 2015-01-02 NOTE — Telephone Encounter (Signed)
Error: They did not have to do a prior authorization. This patient's plan approved the medication Xifaxan 550 mg and the patient's copay is Zero.  She will have no out of pocket expense.  The medication is being delivered to the patient this week.  The patient has been notified by [facility].

## 2015-01-08 ENCOUNTER — Ambulatory Visit (INDEPENDENT_AMBULATORY_CARE_PROVIDER_SITE_OTHER): Payer: Managed Care, Other (non HMO) | Admitting: Gastroenterology

## 2015-01-08 ENCOUNTER — Encounter: Payer: Self-pay | Admitting: Gastroenterology

## 2015-01-08 VITALS — BP 118/70 | HR 100 | Ht 63.0 in | Wt 167.4 lb

## 2015-01-08 DIAGNOSIS — R110 Nausea: Secondary | ICD-10-CM

## 2015-01-08 DIAGNOSIS — R109 Unspecified abdominal pain: Secondary | ICD-10-CM

## 2015-01-08 DIAGNOSIS — R140 Abdominal distension (gaseous): Secondary | ICD-10-CM | POA: Diagnosis not present

## 2015-01-08 DIAGNOSIS — K589 Irritable bowel syndrome without diarrhea: Secondary | ICD-10-CM

## 2015-01-08 NOTE — Progress Notes (Signed)
      History of Present Illness:  Meghan Park was seen last week for left upper quadrant and left lower quadrant pain with  Diarrhea and nausea.  On xifaxan 550 mg twice a day diarrhea has subsided.  She still has nausea although it is improved.  She is without abdominal pain.  She had previously been treated for H. pylori as determined by gastric biopsies that demonstrated mild gastritis as well.    Review of Systems: Pertinent positive and negative review of systems were noted in the above HPI section. All other review of systems were otherwise negative.    Current Medications, Allergies, Past Medical History, Past Surgical History, Family History and Social History were reviewed in [employer] electronic medical record  Vital signs were reviewed in today's medical record. Physical Exam: General: Well developed , well nourished, no acute distress   See Assessment and Plan under Problem List

## 2015-01-08 NOTE — Assessment & Plan Note (Signed)
Diarrhea has responded well to xifaxan.  This could be related to IBS and/or spectral overgrowth.  Recommendations #1 complete 14 day course of xifaxan.  If diarrhea recurs in several weeks may consider repeat antibiotics for possible bacterial overgrowth

## 2015-01-08 NOTE — Assessment & Plan Note (Signed)
Patient has had intermittent nausea that waxes and wanes.  While this could be medication-related, gastroparesis remains a concern.    Recommendations #1 gastric emptying scan

## 2015-01-08 NOTE — Patient Instructions (Signed)
You have been scheduled for a gastric emptying scan at [facility] on 01/22/2015 at 7:30am. Please arrive at least 30 minutes prior to your appointment for registration. Please make certain not to have anything to eat or drink after midnight the night before your test. Hold all stomach medications (ex: Zofran, phenergan, Reglan) 48 hours prior to your test. If you need to reschedule your appointment, please contact radiology scheduling at [phone]. _____________________________________________________________________ A gastric-emptying study measures how long it takes for food to move through your stomach. There are several ways to measure stomach emptying. In the most common test, you eat food that contains a small amount of radioactive material. A scanner that detects the movement of the radioactive material is placed over your abdomen to monitor the rate at which food leaves your stomach. This test normally takes about 2 hours to complete. _____________________________________________________________________    Follow up in 3 months

## 2015-01-11 ENCOUNTER — Other Ambulatory Visit: Payer: Self-pay | Admitting: Family

## 2015-01-15 ENCOUNTER — Encounter: Payer: Self-pay | Admitting: Gastroenterology

## 2015-01-22 ENCOUNTER — Ambulatory Visit (HOSPITAL_COMMUNITY)
Admission: RE | Admit: 2015-01-22 | Discharge: 2015-01-22 | Disposition: A | Discharge status: Home / Self Care | Payer: Managed Care, Other (non HMO) | Source: Ambulatory Visit | Attending: Gastroenterology | Admitting: Gastroenterology

## 2015-01-22 DIAGNOSIS — R112 Nausea with vomiting, unspecified: Secondary | ICD-10-CM | POA: Diagnosis not present

## 2015-01-22 DIAGNOSIS — E119 Type 2 diabetes mellitus without complications: Secondary | ICD-10-CM | POA: Insufficient documentation

## 2015-01-22 DIAGNOSIS — R109 Unspecified abdominal pain: Secondary | ICD-10-CM | POA: Diagnosis present

## 2015-01-22 DIAGNOSIS — K30 Functional dyspepsia: Secondary | ICD-10-CM | POA: Insufficient documentation

## 2015-01-22 DIAGNOSIS — R140 Abdominal distension (gaseous): Secondary | ICD-10-CM | POA: Diagnosis not present

## 2015-01-22 MED ORDER — TECHNETIUM TC 99M SULFUR COLLOID
2.2000 | Freq: Once | INTRAVENOUS | Status: AC | PRN
  Administered 2015-01-22: 2.2 via INTRAVENOUS

## 2015-01-24 ENCOUNTER — Encounter: Payer: Self-pay | Admitting: Gastroenterology

## 2015-01-24 ENCOUNTER — Telehealth: Payer: Self-pay | Admitting: Gastroenterology

## 2015-01-25 ENCOUNTER — Other Ambulatory Visit: Payer: Self-pay | Admitting: Physician Assistant

## 2015-01-28 ENCOUNTER — Other Ambulatory Visit: Payer: Self-pay | Admitting: Family

## 2015-01-28 NOTE — Telephone Encounter (Signed)
Last refill was 6/20. Please advise.

## 2015-01-28 NOTE — Telephone Encounter (Signed)
Patient agrees to this. Contact made with Harriett SineNancy. She has called the patient.

## 2015-01-28 NOTE — Telephone Encounter (Signed)
Patient is interested in participating in the study. She doesn't know what she is to do in the meanwhile. It is not starting for a while. Meghan Park has contacted her. Patient complains of daily nausea. Vomiting some days. Watery bowel movements. Tenderness above the umbilicus that "moves around."

## 2015-01-30 ENCOUNTER — Other Ambulatory Visit: Payer: Self-pay

## 2015-01-30 MED ORDER — METOCLOPRAMIDE HCL 10 MG PO TABS: 10.0000 mg | ORAL_TABLET | Freq: Three times a day (TID) | ORAL | Status: DC

## 2015-01-30 NOTE — Telephone Encounter (Signed)
Discussed this with the patient and she would like to try the medication. [facility] She will cal us if she has any problems, concerns or questions.

## 2015-01-30 NOTE — Telephone Encounter (Signed)
Begin reglan 10 mg 1/2 hr ac and hs Instruct patient to  contact me immediately  if she develops any side effects from her Reglan including paresthesias, tremors, confusion , weakness or muscle spasms.

## 2015-02-05 ENCOUNTER — Telehealth: Payer: Self-pay | Admitting: Gastroenterology

## 2015-02-05 NOTE — Telephone Encounter (Signed)
Area affected is limited to a small area at the waistband of her pants. It has not spread even though she has continued the Reglan. It has been there 3 days now. She will contact her PCP for evaluation. Call back if she has new of worsening symptoms.

## 2015-02-05 NOTE — Telephone Encounter (Signed)
I have left message for the patient to call back 

## 2015-02-12 ENCOUNTER — Encounter: Payer: Self-pay | Admitting: Gastroenterology

## 2015-02-17 ENCOUNTER — Encounter: Payer: Self-pay | Admitting: Gastroenterology

## 2015-02-18 ENCOUNTER — Encounter: Payer: Self-pay | Admitting: Gastroenterology

## 2015-02-19 ENCOUNTER — Other Ambulatory Visit: Payer: Self-pay | Admitting: Physician Assistant

## 2015-02-19 ENCOUNTER — Other Ambulatory Visit: Payer: Self-pay | Admitting: Internal Medicine

## 2015-02-19 ENCOUNTER — Other Ambulatory Visit: Payer: Self-pay

## 2015-02-19 MED ORDER — HYOSCYAMINE SULFATE 0.125 MG SL SUBL: 0.1250 mg | SUBLINGUAL_TABLET | Freq: Four times a day (QID) | SUBLINGUAL | Status: DC | PRN

## 2015-02-19 NOTE — Telephone Encounter (Signed)
rx sent

## 2015-02-20 MED ORDER — PROMETHAZINE HCL 25 MG PO TABS: ORAL_TABLET | ORAL | Status: DC

## 2015-02-20 NOTE — Addendum Note (Signed)
Addended by: Rock Nephew T on: 02/20/2015 08:40 AM   Modules accepted: Orders

## 2015-02-24 ENCOUNTER — Encounter: Payer: Self-pay | Admitting: Gastroenterology

## 2015-02-25 ENCOUNTER — Encounter (HOSPITAL_COMMUNITY): Payer: Self-pay | Admitting: Psychology

## 2015-02-25 ENCOUNTER — Encounter: Payer: Self-pay | Admitting: Physician Assistant

## 2015-02-25 ENCOUNTER — Ambulatory Visit (INDEPENDENT_AMBULATORY_CARE_PROVIDER_SITE_OTHER): Payer: Managed Care, Other (non HMO) | Admitting: Physician Assistant

## 2015-02-25 VITALS — BP 110/70 | HR 78 | Ht 63.0 in | Wt 180.4 lb

## 2015-02-25 DIAGNOSIS — F331 Major depressive disorder, recurrent, moderate: Secondary | ICD-10-CM

## 2015-02-25 DIAGNOSIS — K3184 Gastroparesis: Secondary | ICD-10-CM

## 2015-02-25 DIAGNOSIS — K589 Irritable bowel syndrome without diarrhea: Secondary | ICD-10-CM | POA: Diagnosis not present

## 2015-02-25 DIAGNOSIS — F411 Generalized anxiety disorder: Secondary | ICD-10-CM

## 2015-02-25 MED ORDER — SACCHAROMYCES BOULARDII 250 MG PO CAPS: 250.0000 mg | ORAL_CAPSULE | Freq: Two times a day (BID) | ORAL | Status: DC

## 2015-02-25 MED ORDER — METRONIDAZOLE 250 MG PO TABS: 250.0000 mg | ORAL_TABLET | Freq: Three times a day (TID) | ORAL | Status: DC

## 2015-02-25 NOTE — Patient Instructions (Addendum)
You have been given a separate informational sheet regarding your tobacco use, the importance of quitting and local resources to help you quit.  We have sent the following medications to your pharmacy for you to pick up at your convenience:  Flagyl  You have been given a copy of the FODMAP diet.  We will call you regarding the IBS test.  Please follow up with Dr. Arlyce Dice on 05/06/2015 at 2:15pm

## 2015-02-25 NOTE — Progress Notes (Signed)
Meghan Park is a 44 y.o. female patient discharged from counseling as last seen on 10/02/14.  Outpatient Therapist Discharge Summary  Meghan Park    [birth year]   Admission Date: 09/12/14   Discharge Date:  02/25/15 Reason for Discharge:  Not active with counseling Diagnosis:    MDD (major depressive disorder), recurrent episode, moderate  GAD (generalized anxiety disorder)   Comments:  Pt will continue as scheduled w/ Dr. Michae Kava.  Alesia Banda [facility], [facility]

## 2015-02-25 NOTE — Progress Notes (Signed)
Patient ID: Meghan Park, female   DOB: 09-12-1970, 44 y.o.   MRN: [id number]     History of Present Illness: Meghan Park is a pleasant 44 year old female known to Dr. Arlyce Dice. She is here for follow-up. She has a past history is a 44 year old patient here for follow-up. She is known to Dr. Arlyce Dice and has a history of anxiety, major depression, diabetes, peripheral neuropathy, hypertension, and IBS. She was last seen in the office by Dr. Arlyce Dice on June 15 at which time a course of Xifaxan was prescribed. She used it for 12 days and then states she was instructed to discontinue it. She was also sent for a gastric emptying scan that revealed a mild delay in gastric emptying. She has on metoclopramide and feels it provides no relief. Her main complaint today is bloating. She is not having any further diarrhea, in fact she states she is skipping 3-4 days between a bowel movement and then has a mushy bowel movement for one bout of diarrhea. She has gained 10 pounds. She states she is afraid to eat because everything bloats her. She was evaluated by GYN a year ago and had a pelvic ultrasound that was nonrevealing. She is tearful and states she has no wife because she can't leave the house due to her abdominal bloating. She is working with a Veterinary surgeon and has an appointment to be seen at behavioral health in 3 weeks. She readily admits that her anxiety exacerbates her symptoms of bloating and cramping. She is having some problems at home and feels that whenever these home problems flare her GI symptoms flare.   Past Medical History  Diagnosis Date  . DM (diabetes mellitus), type 2, uncontrolled   . Anxiety associated with depression   . Hip pain, right   . Depression   . Anxiety   . Pneumonia hx  . Cystitis   . Cholelithiasis   . Fatty liver   . Arthritis hip, right  . Insomnia   . Hypertension   . Nausea   . Anal fissure   . Chronic headaches   . HLD (hyperlipidemia)   . Pneumonia   . Diabetes  mellitus, type II   . H pylori ulcer     Past Surgical History  Procedure Laterality Date  . Abdominal hysterectomy  2012  . Cholecystectomy  04/13/12  . Ercp  04/24/2012    Procedure: ENDOSCOPIC RETROGRADE CHOLANGIOPANCREATOGRAPHY (ERCP);  Surgeon: Theda Belfast, MD;  Location: [facility];  Service: Endoscopy;  Laterality: N/A;  . Tubal ligation    . Ercp  06/02/2012    Procedure: ENDOSCOPIC RETROGRADE CHOLANGIOPANCREATOGRAPHY (ERCP);  Surgeon: Theda Belfast, MD;  Location: [facility];  Service: Endoscopy;  Laterality: N/A;  . Gallbladder surgery  2013  . Cesarean section  [id number]    x2   Family History  Problem Relation Age of Onset  . Anxiety disorder Mother   . Hyperlipidemia Mother   . Irritable bowel syndrome Mother   . Arthritis Father   . COPD Father   . Heart disease Father   . Heart disease Maternal Grandfather   . Congestive Heart Failure Maternal Grandmother   . Heart disease Maternal Grandmother   . Heart disease Paternal Grandfather   . Breast cancer Paternal Grandmother     great  . Diabetes Maternal Aunt   . Anxiety disorder Son     social anxiety  . Alcohol abuse Maternal Uncle   . Colon cancer Neg Hx   .  Uterine cancer Maternal Aunt     great   History  Substance Use Topics  . Smoking status: Current Every Day Smoker -- 1.00 packs/day for 23 years    Types: Cigarettes  . Smokeless tobacco: Never Used     Comment: Tobacco info given 02/25/15  . Alcohol Use: No     Comment: quit 18 yrs ago   Current Outpatient Prescriptions  Medication Sig Dispense Refill  . clonazePAM (KLONOPIN) 1 MG tablet Take 1/2 to 1 tab po qD prn anxiety 30 tablet 4  . cyclobenzaprine (FLEXERIL) 10 MG tablet TAKE 1 TABLET BY MOUTH AT BEDTIME AS NEEDED FOR MUSCLE SPASMS 30 tablet 11  . gabapentin (NEURONTIN) 100 MG capsule     . hydrOXYzine (VISTARIL) 25 MG capsule Take 3 capsules (75 mg total) by mouth at bedtime as needed. 90 capsule 4  . hyoscyamine (LEVSIN SL) 0.125 MG  SL tablet Take 1 tablet (0.125 mg total) by mouth every 6 (six) hours as needed. 30 tablet 2  . metoCLOPramide (REGLAN) 10 MG tablet Take 1 tablet (10 mg total) by mouth 4 (four) times daily -  before meals and at bedtime. 120 tablet 1  . pantoprazole (PROTONIX) 40 MG tablet Take 1 tablet (40 mg total) by mouth daily. 30 MINUTES BEFORE BREAKFAST 30 tablet 3  . promethazine (PHENERGAN) 25 MG tablet TAKE 1/2 TO 1 TABLET BY MOUTH EVERY 8 HOURS AS NEEDED FOR NAUSEA OR VOMITING 20 tablet 0  . sertraline (ZOLOFT) 100 MG tablet Take 1.5 tablets (150 mg total) by mouth daily. TAKE 1 TABLET(100 MG) BY MOUTH DAILY 45 tablet 4  . metroNIDAZOLE (FLAGYL) 250 MG tablet Take 1 tablet (250 mg total) by mouth 3 (three) times daily. 30 tablet 0  . saccharomyces boulardii (FLORASTOR) 250 MG capsule Take 1 capsule (250 mg total) by mouth 2 (two) times daily. 60 capsule 2   No current facility-administered medications for this visit.   Allergies  Allergen Reactions  . Januvia [Sitagliptin]     GI upset  . Morphine And Related Dermatitis and Other (See Comments)    Severe itching leading to welts and open wounds.  Marcelline Deist [Dapagliflozin]     yeast      Review of Systems: Gen: Denies any fever, chills, sweats, anorexia, fatigue, weakness, malaise, weight loss, and sleep disorder CV: Denies chest pain, angina, palpitations, syncope, orthopnea, PND, peripheral edema, and claudication. Resp: Denies dyspnea at rest, dyspnea with exercise, cough, sputum, wheezing, coughing up blood, and pleurisy. GI: Denies vomiting blood, jaundice, and fecal incontinence.   Denies dysphagia or odynophagia. GU : Denies urinary burning, blood in urine, urinary frequency, urinary hesitancy, nocturnal urination, and urinary incontinence. MS: Denies joint pain, limitation of movement, and swelling, stiffness, low back pain, extremity pain. Denies muscle weakness, cramps, atrophy.  Derm: Denies rash, itching, dry skin, hives, moles,  warts, or unhealing ulcers.  Psych: Denies depression, anxiety, memory loss, suicidal ideation, hallucinations, paranoia, and confusion. Heme: Denies bruising, bleeding, and enlarged lymph nodes. Neuro:  Denies any headaches, dizziness, paresthesia Endo:  Denies any problems with DM, thyroid, adrenal    Studies:  CLINICAL DATA: Abdominal pain, bloating, nausea and vomiting, history of diabetes.  EXAM: NUCLEAR MEDICINE GASTRIC EMPTYING SCAN  TECHNIQUE: After oral ingestion of radiolabeled meal, sequential abdominal images were obtained for 4 hours. Percentage of activity emptying the stomach was calculated at 1 hour, 2 hour, 3 hour, and 4 hours.  RADIOPHARMACEUTICALS: 2.2 mCi Tc-56m MDP labeled sulfur colloid orally and  angle with toast and 4 oz of water  COMPARISON: Gastric emptying study of September 13, 2013  FINDINGS: Expected location of the stomach in the left upper quadrant. Ingested meal empties the stomach gradually over the course of the study.  29% emptied at 1 hr ( normal >= 10%)  61% emptied at 2 hr ( normal >= 40%)  64% emptied at 3 hr ( normal >= 70%)  66% emptied at 4 hr ( normal >= 90%)  IMPRESSION: Delayed gastric emptying study.   Electronically Signed  By: David Swaziland M.D.  On: 01/22/2015 11:49    Physical Exam: General: Pleasant, well developed , Caucasian female in no acute distress Head: Normocephalic and atraumatic Eyes:  sclerae anicteric, conjunctiva pink  Ears: Normal auditory acuity Lungs: Clear throughout to auscultation Heart: Regular rate and rhythm Abdomen: Soft, non distended, mild diffuse tenderness to palpation with no rebound or guarding No masses, no hepatomegaly. Normal bowel sounds Musculoskeletal: Symmetrical with no gross deformities  Extremities: No edema  Neurological: Alert oriented x 4, grossly nonfocal Psychological:  Alert and cooperative. Normal mood and affect  Assessment and  Recommendations: 44 year old female with a multitude of functional bowel complaints or for follow-up. She has been noted to have documented gastroparesis by gastric emptying scan and has been instructed to adhere to a gastroparesis diet. She may advance to small volume, frequent, low-fat feedings as tolerated. Continue her metoclopramide. She has also been given a FODMAP diet to review so she may be aware of foods that can trigger her symptoms. She will continue [place] store one twice daily and will be given a trial of Flagyl 250 mg 3 times a day for 10 days. We will check on the availability of the IBSchek test as well. She will follow up in 2 months, sooner if needed.        Deanta Mincey, Tollie Pizza PA-C 02/25/2015,

## 2015-02-25 NOTE — Progress Notes (Signed)
Reviewed and agree with management.  Patient has gastroparesis.  Will refer to GI research for consideration of study participation  Barbette Hair. Arlyce Dice, M.D., [facility]

## 2015-02-27 ENCOUNTER — Telehealth: Payer: Self-pay | Admitting: Gastroenterology

## 2015-02-28 NOTE — Telephone Encounter (Signed)
Do you know how to get this message to [employer] (downstairs)? FMLA never comes to my desk. I don't have anything on this.

## 2015-03-04 ENCOUNTER — Encounter: Payer: Self-pay | Admitting: Gastroenterology

## 2015-03-05 ENCOUNTER — Telehealth: Payer: Self-pay | Admitting: Gastroenterology

## 2015-03-05 ENCOUNTER — Other Ambulatory Visit: Payer: Self-pay | Admitting: Internal Medicine

## 2015-03-05 NOTE — Telephone Encounter (Signed)
She is on Reglan, Levsin SL, Protonix and Phenergan. She still has pain. What are her options?

## 2015-03-06 ENCOUNTER — Other Ambulatory Visit: Payer: Self-pay

## 2015-03-06 MED ORDER — LUBIPROSTONE 8 MCG PO CAPS: 8.0000 ug | ORAL_CAPSULE | Freq: Two times a day (BID) | ORAL | Status: DC

## 2015-03-06 MED ORDER — PROMETHAZINE HCL 25 MG PO TABS: ORAL_TABLET | ORAL | Status: DC

## 2015-03-06 NOTE — Telephone Encounter (Signed)
Spoke with the patient. She says there is bloating with her abdominal pain. It occurs after she eats and sometimes in the middle of the night. She is willing to try the Amitiza. Rx to her pharmacy. She will follow up by phone.

## 2015-03-06 NOTE — Telephone Encounter (Signed)
No answer. Message left to return my call. Also I have sent her an email.

## 2015-03-06 NOTE — Telephone Encounter (Signed)
Is her main complaint still abdominal bloating?  If so begin Amitiza 8 g twice a day.  Call back if unimproved over the next 3-4 days.

## 2015-03-13 ENCOUNTER — Encounter: Payer: Self-pay | Admitting: Gastroenterology

## 2015-03-17 ENCOUNTER — Other Ambulatory Visit (INDEPENDENT_AMBULATORY_CARE_PROVIDER_SITE_OTHER): Payer: Managed Care, Other (non HMO)

## 2015-03-17 ENCOUNTER — Ambulatory Visit (INDEPENDENT_AMBULATORY_CARE_PROVIDER_SITE_OTHER): Payer: Managed Care, Other (non HMO) | Admitting: Gastroenterology

## 2015-03-17 ENCOUNTER — Encounter: Payer: Self-pay | Admitting: Gastroenterology

## 2015-03-17 VITALS — BP 128/82 | HR 97 | Ht 63.0 in | Wt 186.4 lb

## 2015-03-17 DIAGNOSIS — K589 Irritable bowel syndrome without diarrhea: Secondary | ICD-10-CM | POA: Diagnosis not present

## 2015-03-17 DIAGNOSIS — K3184 Gastroparesis: Secondary | ICD-10-CM

## 2015-03-17 DIAGNOSIS — R197 Diarrhea, unspecified: Secondary | ICD-10-CM

## 2015-03-17 LAB — URINALYSIS
BILIRUBIN URINE: NEGATIVE
HGB URINE DIPSTICK: NEGATIVE
Ketones, ur: NEGATIVE
Leukocytes, UA: NEGATIVE
Nitrite: NEGATIVE
PH: 6 (ref 5.0–8.0)
Specific Gravity, Urine: 1.01 (ref 1.000–1.030)
TOTAL PROTEIN, URINE-UPE24: NEGATIVE
URINE GLUCOSE: NEGATIVE
Urobilinogen, UA: 0.2 (ref 0.0–1.0)

## 2015-03-17 LAB — CBC WITH DIFFERENTIAL/PLATELET
BASOS ABS: 0.1 10*3/uL (ref 0.0–0.1)
Basophils Relative: 0.6 % (ref 0.0–3.0)
Eosinophils Absolute: 0.3 10*3/uL (ref 0.0–0.7)
Eosinophils Relative: 3.1 % (ref 0.0–5.0)
HCT: 35.7 % — ABNORMAL LOW (ref 36.0–46.0)
Hemoglobin: 12.4 g/dL (ref 12.0–15.0)
LYMPHS ABS: 3.3 10*3/uL (ref 0.7–4.0)
Lymphocytes Relative: 38.5 % (ref 12.0–46.0)
MCHC: 34.6 g/dL (ref 30.0–36.0)
MCV: 89.2 fl (ref 78.0–100.0)
MONO ABS: 0.6 10*3/uL (ref 0.1–1.0)
Monocytes Relative: 6.6 % (ref 3.0–12.0)
NEUTROS ABS: 4.4 10*3/uL (ref 1.4–7.7)
Neutrophils Relative %: 51.2 % (ref 43.0–77.0)
PLATELETS: 207 10*3/uL (ref 150.0–400.0)
RBC: 4 Mil/uL (ref 3.87–5.11)
RDW: 13.2 % (ref 11.5–15.5)
WBC: 8.5 10*3/uL (ref 4.0–10.5)

## 2015-03-17 LAB — IBC PANEL
IRON: 85 ug/dL (ref 42–145)
Saturation Ratios: 21.9 % (ref 20.0–50.0)
Transferrin: 277 mg/dL (ref 212.0–360.0)

## 2015-03-17 NOTE — Assessment & Plan Note (Signed)
This is probably related to bacterial overgrowth.  She's had an excellent response to antibiotics.  Plan to cycle antibiotics every few weeks when diarrhea recurs.

## 2015-03-17 NOTE — Assessment & Plan Note (Signed)
Patient remains symptomatic despite metoclopramide and low-dose Amitiza.  She will consider enrollment in a gastroparesis trial.

## 2015-03-17 NOTE — Patient Instructions (Signed)
Harriett Sine from research will be calling you You will need to hold reglan 24 hours before your research study

## 2015-03-17 NOTE — Progress Notes (Signed)
      History of Present Illness:  Meghan Park continues to complain of abdominal bloating, discomfort and nausea.  She remains on low-dose Amitiza and metoclopramide.  Since starting Flagyl diarrhea has subsided.  She has suspected bacterial overgrowth.    Review of Systems: Pertinent positive and negative review of systems were noted in the above HPI section. All other review of systems were otherwise negative.    Current Medications, Allergies, Past Medical History, Past Surgical History, Family History and Social History were reviewed in [employer] electronic medical record  Vital signs were reviewed in today's medical record. Physical Exam: General: Well developed , well nourished, no acute distress  See Assessment and Plan under Problem List

## 2015-03-18 ENCOUNTER — Encounter: Payer: Self-pay | Admitting: Gastroenterology

## 2015-03-21 ENCOUNTER — Encounter: Payer: Self-pay | Admitting: Gastroenterology

## 2015-03-21 ENCOUNTER — Other Ambulatory Visit: Payer: Self-pay | Admitting: Internal Medicine

## 2015-03-25 ENCOUNTER — Encounter: Payer: Self-pay | Admitting: Gastroenterology

## 2015-03-25 ENCOUNTER — Ambulatory Visit (INDEPENDENT_AMBULATORY_CARE_PROVIDER_SITE_OTHER): Payer: Managed Care, Other (non HMO) | Admitting: Gastroenterology

## 2015-03-25 VITALS — BP 116/84 | HR 102 | Ht 63.0 in | Wt 186.4 lb

## 2015-03-25 DIAGNOSIS — K3184 Gastroparesis: Secondary | ICD-10-CM

## 2015-03-25 DIAGNOSIS — K589 Irritable bowel syndrome without diarrhea: Secondary | ICD-10-CM

## 2015-03-25 MED ORDER — METRONIDAZOLE 250 MG PO TABS: 250.0000 mg | ORAL_TABLET | Freq: Three times a day (TID) | ORAL | Status: DC

## 2015-03-25 MED ORDER — HYOSCYAMINE SULFATE ER 0.375 MG PO TBCR: 1.0000 | EXTENDED_RELEASE_TABLET | Freq: Two times a day (BID) | ORAL | Status: DC

## 2015-03-25 NOTE — Patient Instructions (Signed)
Go to the basement for labs today We have sent in your prescriptions to your pharmacy today

## 2015-04-01 ENCOUNTER — Encounter: Payer: Self-pay | Admitting: Gastroenterology

## 2015-04-01 ENCOUNTER — Other Ambulatory Visit: Payer: Self-pay | Admitting: Internal Medicine

## 2015-04-01 MED ORDER — PROMETHAZINE HCL 25 MG PO TABS: ORAL_TABLET | ORAL | Status: DC

## 2015-04-01 NOTE — Telephone Encounter (Signed)
Pt requesting RF on Phenergan. Ok to RF?

## 2015-04-02 ENCOUNTER — Encounter: Payer: Self-pay | Admitting: Internal Medicine

## 2015-04-02 ENCOUNTER — Ambulatory Visit (INDEPENDENT_AMBULATORY_CARE_PROVIDER_SITE_OTHER): Payer: Managed Care, Other (non HMO) | Admitting: Internal Medicine

## 2015-04-02 ENCOUNTER — Telehealth: Payer: Self-pay | Admitting: Internal Medicine

## 2015-04-02 ENCOUNTER — Other Ambulatory Visit (INDEPENDENT_AMBULATORY_CARE_PROVIDER_SITE_OTHER): Payer: Managed Care, Other (non HMO)

## 2015-04-02 VITALS — BP 106/72 | HR 107 | Temp 98.5°F | Resp 16 | Ht 63.0 in | Wt 187.0 lb

## 2015-04-02 DIAGNOSIS — D509 Iron deficiency anemia, unspecified: Secondary | ICD-10-CM

## 2015-04-02 DIAGNOSIS — I10 Essential (primary) hypertension: Secondary | ICD-10-CM | POA: Diagnosis not present

## 2015-04-02 DIAGNOSIS — E1165 Type 2 diabetes mellitus with hyperglycemia: Secondary | ICD-10-CM | POA: Diagnosis not present

## 2015-04-02 DIAGNOSIS — K3184 Gastroparesis: Secondary | ICD-10-CM

## 2015-04-02 DIAGNOSIS — Z23 Encounter for immunization: Secondary | ICD-10-CM

## 2015-04-02 DIAGNOSIS — R002 Palpitations: Secondary | ICD-10-CM

## 2015-04-02 DIAGNOSIS — E871 Hypo-osmolality and hyponatremia: Secondary | ICD-10-CM

## 2015-04-02 DIAGNOSIS — IMO0002 Reserved for concepts with insufficient information to code with codable children: Secondary | ICD-10-CM

## 2015-04-02 LAB — BASIC METABOLIC PANEL
BUN: 7 mg/dL (ref 6–23)
CALCIUM: 9.2 mg/dL (ref 8.4–10.5)
CHLORIDE: 95 meq/L — AB (ref 96–112)
CO2: 29 mEq/L (ref 19–32)
CREATININE: 0.59 mg/dL (ref 0.40–1.20)
GFR: 117.61 mL/min (ref >60.00)
Glucose, Bld: 111 mg/dL — ABNORMAL HIGH (ref 70–99)
Potassium: 4.3 mEq/L (ref 3.5–5.1)
Sodium: 131 mEq/L — ABNORMAL LOW (ref 135–145)

## 2015-04-02 LAB — CBC WITH DIFFERENTIAL/PLATELET
BASOS ABS: 0.1 10*3/uL (ref 0.0–0.1)
BASOS PCT: 0.7 % (ref 0.0–3.0)
EOS ABS: 0.2 10*3/uL (ref 0.0–0.7)
Eosinophils Relative: 2.4 % (ref 0.0–5.0)
HEMATOCRIT: 38.8 % (ref 36.0–46.0)
HEMOGLOBIN: 13.3 g/dL (ref 12.0–15.0)
LYMPHS PCT: 29.5 % (ref 12.0–46.0)
Lymphs Abs: 2.8 10*3/uL (ref 0.7–4.0)
MCHC: 34.2 g/dL (ref 30.0–36.0)
MCV: 89.4 fl (ref 78.0–100.0)
Monocytes Absolute: 0.7 10*3/uL (ref 0.1–1.0)
Monocytes Relative: 7.4 % (ref 3.0–12.0)
Neutro Abs: 5.8 10*3/uL (ref 1.4–7.7)
Neutrophils Relative %: 60 % (ref 43.0–77.0)
Platelets: 196 10*3/uL (ref 150.0–400.0)
RBC: 4.35 Mil/uL (ref 3.87–5.11)
RDW: 13.4 % (ref 11.5–15.5)
WBC: 9.6 10*3/uL (ref 4.0–10.5)

## 2015-04-02 LAB — TSH: TSH: 1.37 u[IU]/mL (ref 0.35–4.50)

## 2015-04-02 LAB — T4: T4 TOTAL: 9.1 ug/dL (ref 4.5–12.0)

## 2015-04-02 LAB — T3, FREE: T3, Free: 2.7 pg/mL (ref 2.3–4.2)

## 2015-04-02 MED ORDER — ONDANSETRON HCL 4 MG PO TABS: 4.0000 mg | ORAL_TABLET | Freq: Two times a day (BID) | ORAL | Status: DC

## 2015-04-02 NOTE — Patient Instructions (Signed)

## 2015-04-02 NOTE — Progress Notes (Signed)
Pre visit review using our clinic review tool, if applicable. No additional management support is needed unless otherwise documented below in the visit note. 

## 2015-04-02 NOTE — Telephone Encounter (Signed)
Patient Name: Luis Edberg  DOB: 1970-11-25    Initial Comment Caller states, has new symptoms, heart palpitations when she is standing or walking around. Twice Yesterday she had tremors when she was laying down yesterday.   Nurse Assessment  Nurse: Scarlette Ar, RN, Heather Date/Time (Eastern Time): 04/02/2015 8:49:38 AM  Confirm and document reason for call. If symptomatic, describe symptoms. ---Caller states, has new symptoms, heart palpitations when she is standing or walking around. Twice Yesterday she had tremors when she was laying down yesterday. The palpitations started Saturday, the episodes last about 10 min and it feels like her heart is beating fast.  Has the patient traveled out of the country within the last 30 days? ---Not Applicable  Does the patient require triage? ---Yes  Related visit to physician within the last 2 weeks? ---No  Does the PT have any chronic conditions? (i.e. diabetes, asthma, etc.) ---Yes  List chronic conditions. ---see MR  Did the patient indicate they were pregnant? ---No     Guidelines    Guideline Title Affirmed Question Affirmed Notes  Heart Rate and Heartbeat Questions [1] Palpitations AND [2] no improvement after following Care Advice    Final Disposition User   See PCP When Office is Open (within 3 days) Standifer, RN, Herbert Seta    Comments  Appt made with Dr. Yetta Barre today at 2 pm.   Disagree/Comply: Comply

## 2015-04-02 NOTE — Progress Notes (Signed)
Subjective:  Patient ID: Meghan Park, female    DOB: [birth year]  Age: 44 y.o. MRN: [id number]  CC: Anemia; Hypertension; and Palpitations   HPI Meghan Park presents for follow-up but she also complains of feeling poorly for the last 5 days. She feels like her heart has been racing and she has had muscle twitches. She states several times in the last few days she feels like her muscles were moving and she couldn't control them. She has had palpitations that wake her during her sleep. She has felt more anxious than normal.  Outpatient Prescriptions Prior to Visit  Medication Sig Dispense Refill  . clonazePAM (KLONOPIN) 1 MG tablet Take 1/2 to 1 tab po qD prn anxiety 30 tablet 4  . cyclobenzaprine (FLEXERIL) 10 MG tablet TAKE 1 TABLET BY MOUTH AT BEDTIME AS NEEDED FOR MUSCLE SPASMS 30 tablet 11  . gabapentin (NEURONTIN) 100 MG capsule     . hydrOXYzine (VISTARIL) 25 MG capsule Take 3 capsules (75 mg total) by mouth at bedtime as needed. 90 capsule 4  . Hyoscyamine Sulfate 0.375 MG TBCR Take 1 tablet (0.375 mg total) by mouth 2 (two) times daily. 60 each 0  . metroNIDAZOLE (FLAGYL) 250 MG tablet Take 1 tablet (250 mg total) by mouth 3 (three) times daily. 30 tablet 0  . sertraline (ZOLOFT) 100 MG tablet Take 1.5 tablets (150 mg total) by mouth daily. TAKE 1 TABLET(100 MG) BY MOUTH DAILY 45 tablet 4  . promethazine (PHENERGAN) 25 MG tablet TAKE 1/2 TO 1 TABLET BY MOUTH EVERY 8 HOURS AS NEEDED FOR NAUSEA OR VOMITING 20 tablet 1  . saccharomyces boulardii (FLORASTOR) 250 MG capsule Take 1 capsule (250 mg total) by mouth 2 (two) times daily. (Patient not taking: Reported on 04/02/2015) 60 capsule 2   No facility-administered medications prior to visit.    ROS Review of Systems  Constitutional: Negative.  Negative for fever, chills, diaphoresis, fatigue and unexpected weight change.  HENT: Negative.   Eyes: Negative.   Respiratory: Negative.  Negative for cough, choking, chest tightness,  shortness of breath and stridor.   Cardiovascular: Positive for palpitations.  Gastrointestinal: Positive for nausea and vomiting. Negative for abdominal pain, diarrhea and constipation.  Endocrine: Negative.   Genitourinary: Negative.  Negative for difficulty urinating.  Musculoskeletal: Negative.   Skin: Negative.   Allergic/Immunologic: Negative.   Neurological: Negative.  Negative for dizziness, syncope, weakness, light-headedness and headaches.  Hematological: Negative.  Negative for adenopathy. Does not bruise/bleed easily.  Psychiatric/Behavioral: Positive for sleep disturbance and dysphoric mood. Negative for suicidal ideas, hallucinations, behavioral problems, confusion, self-injury, decreased concentration and agitation. The patient is nervous/anxious. The patient is not hyperactive.     Objective:  BP 106/72 mmHg  Pulse 107  Temp(Src) 98.5 F (36.9 C) (Oral)  Resp 16  Ht 5\' 3"  (1.6 m)  Wt 187 lb (84.823 kg)  BMI 33.13 kg/m2  SpO2 96%  BP Readings from Last 3 Encounters:  04/02/15 106/72  03/25/15 116/84  03/17/15 128/82    Wt Readings from Last 3 Encounters:  04/02/15 187 lb (84.823 kg)  03/25/15 186 lb 6.4 oz (84.55 kg)  03/17/15 186 lb 6.4 oz (84.55 kg)    Physical Exam  Constitutional: She is oriented to person, place, and time. No distress.  HENT:  Head: Normocephalic and atraumatic.  Mouth/Throat: Oropharynx is clear and moist. No oropharyngeal exudate.  Eyes: Conjunctivae are normal. Right eye exhibits no discharge. Left eye exhibits no discharge. No scleral icterus.  Neck: Normal range of motion. Neck supple. No JVD present. No tracheal deviation present. No thyromegaly present.  Cardiovascular: Normal rate, regular rhythm, normal heart sounds and intact distal pulses.  Exam reveals no gallop and no friction rub.   No murmur heard. Sinus  Rhythm  Low voltage in limb leads.   ABNORMAL    Pulmonary/Chest: Effort normal and breath sounds normal. No  stridor. No respiratory distress. She has no wheezes. She has no rales. She exhibits no tenderness.  Abdominal: Soft. Bowel sounds are normal. She exhibits no distension and no mass. There is no tenderness. There is no rebound and no guarding.  Musculoskeletal: Normal range of motion. She exhibits no edema or tenderness.  Lymphadenopathy:    She has no cervical adenopathy.  Neurological: She is oriented to person, place, and time.  Skin: Skin is warm and dry. No rash noted. She is not diaphoretic. No erythema. No pallor.  Psychiatric: She has a normal mood and affect. Her behavior is normal. Judgment and thought content normal.  Vitals reviewed.   Lab Results  Component Value Date   WBC 9.6 04/02/2015   HGB 13.3 04/02/2015   HCT 38.8 04/02/2015   PLT 196.0 04/02/2015   GLUCOSE 111* 04/02/2015   CHOL 158 04/15/2014   TRIG 153.0* 04/15/2014   HDL 31.80* 04/15/2014   LDLCALC 96 04/15/2014   ALT 11 11/21/2014   AST 14 11/21/2014   NA 131* 04/02/2015   K 4.3 04/02/2015   CL 95* 04/02/2015   CREATININE 0.59 04/02/2015   BUN 7 04/02/2015   CO2 29 04/02/2015   TSH 1.37 04/02/2015   INR 1.18 04/22/2012   HGBA1C 5.8 10/30/2014   MICROALBUR 0.5 07/17/2014    Nm Gastric Emptying  01/22/2015   CLINICAL DATA:  Abdominal pain, bloating, nausea and vomiting, history of diabetes.  EXAM: NUCLEAR MEDICINE GASTRIC EMPTYING SCAN  TECHNIQUE: After oral ingestion of radiolabeled meal, sequential abdominal images were obtained for 4 hours. Percentage of activity emptying the stomach was calculated at 1 hour, 2 hour, 3 hour, and 4 hours.  RADIOPHARMACEUTICALS:  2.2 mCi Tc-45m MDP labeled sulfur colloid orally and angle with toast and 4 oz of water  COMPARISON:  Gastric emptying study of September 13, 2013  FINDINGS: Expected location of the stomach in the left upper quadrant. Ingested meal empties the stomach gradually over the course of the study.  29% emptied at 1 hr ( normal >= 10%)  61% emptied at 2  hr ( normal >= 40%)  64% emptied at 3 hr ( normal >= 70%)  66% emptied at 4 hr ( normal >= 90%)  IMPRESSION: Delayed gastric emptying study.   Electronically Signed   By: David  [place] M.D.   On: 01/22/2015 11:49    Assessment & Plan:   Lucky was seen today for anemia, hypertension and palpitations.  Diagnoses and all orders for this visit:  Essential hypertension- her blood pressures well controlled, she has low sodium and low chloride, I will screen her for adrenal insufficiency. -     Cortisol; Future -     Basic metabolic panel; Future -     ACTH; Future  Anemia, iron deficiency- improvement noted. -     CBC with Differential/Platelet; Future  DM (diabetes mellitus), type 2, uncontrolled -     Basic metabolic panel; Future  Gastroparesis- the Phenergan has caused tardive dyskinesia side of asked her to stop taking it. She will try Zofran for the nausea and vomiting. -  ondansetron (ZOFRAN) 4 MG tablet; Take 1 tablet (4 mg total) by mouth 2 (two) times daily.  Palpitations- - her palpitations sound benign and may be related to anxiety or tardive dyskinesia. Her thyroid function studies are normal. She does have some abnormal electrolytes. I've asked her to have a 48 hour Holter monitor performed to see if she has a dysrhythmia.      EKG 12-Lead -     T3, free; Future -     T4; Future -     TSH; Future -     Holter monitor - 48 hour; Future  Need for influenza vaccination -     Flu Vaccine QUAD 36+ mos IM  Need for vaccination with 13-polyvalent pneumococcal conjugate vaccine -     Pneumococcal conjugate vaccine 13-valent  Hyponatremia- she is not on any medications that would cause a low sodium but she does have chronic nausea and vomiting. That may explain her low sodium but will also screen her for adrenal insufficiency with a cortisol level and an ACTH level. -     Cortisol; Future -     Basic metabolic panel; Future -     ACTH; Future   I have discontinued Meghan Park's saccharomyces boulardii and promethazine. I am also having her start on ondansetron. Additionally, I am having her maintain her cyclobenzaprine, clonazePAM, hydrOXYzine, sertraline, gabapentin, metroNIDAZOLE, and Hyoscyamine Sulfate.  Meds ordered this encounter  Medications  . ondansetron (ZOFRAN) 4 MG tablet    Sig: Take 1 tablet (4 mg total) by mouth 2 (two) times daily.    Dispense:  60 tablet    Refill:  5     Follow-up: Return in about 6 weeks (around 05/14/2015).  Sanda Linger, MD

## 2015-04-03 ENCOUNTER — Encounter: Payer: Self-pay | Admitting: Internal Medicine

## 2015-04-03 DIAGNOSIS — E871 Hypo-osmolality and hyponatremia: Secondary | ICD-10-CM | POA: Insufficient documentation

## 2015-04-04 ENCOUNTER — Other Ambulatory Visit: Payer: Self-pay

## 2015-04-04 ENCOUNTER — Telehealth: Payer: Self-pay

## 2015-04-04 ENCOUNTER — Other Ambulatory Visit (INDEPENDENT_AMBULATORY_CARE_PROVIDER_SITE_OTHER): Payer: Managed Care, Other (non HMO)

## 2015-04-04 DIAGNOSIS — I10 Essential (primary) hypertension: Secondary | ICD-10-CM | POA: Diagnosis not present

## 2015-04-04 DIAGNOSIS — E871 Hypo-osmolality and hyponatremia: Secondary | ICD-10-CM

## 2015-04-04 LAB — BASIC METABOLIC PANEL
BUN: 8 mg/dL (ref 6–23)
CALCIUM: 9 mg/dL (ref 8.4–10.5)
CO2: 27 mEq/L (ref 19–32)
Chloride: 97 mEq/L (ref 96–112)
Creatinine, Ser: 0.62 mg/dL (ref 0.40–1.20)
GFR: 111.06 mL/min (ref >60.00)
Glucose, Bld: 147 mg/dL — ABNORMAL HIGH (ref 70–99)
POTASSIUM: 4.3 meq/L (ref 3.5–5.1)
SODIUM: 133 meq/L — AB (ref 135–145)

## 2015-04-04 LAB — CORTISOL: Cortisol, Plasma: 6.9 ug/dL

## 2015-04-04 MED ORDER — ERYTHROMYCIN BASE 250 MG PO TABS: 250.0000 mg | ORAL_TABLET | Freq: Four times a day (QID) | ORAL | Status: DC

## 2015-04-04 NOTE — Telephone Encounter (Signed)
-----   Message from Louis Meckel, MD sent at 04/04/2015  1:55 PM EDT ----- She should c/b 3 weeks ----- Message -----    From: Evalee Jefferson, LPN    Sent: 03/01/5783  10:06 AM      To: Louis Meckel, MD  How long will she do this? ----- Message -----    From: Louis Meckel, MD    Sent: 04/03/2015   4:57 PM      To: Evalee Jefferson, LPN  If she has not been on erythromycin then begin to do 50 mg 4 times a day ----- Message -----    From: Evalee Jefferson, LPN    Sent: 01/01/6294   4:46 PM      To: Louis Meckel, MD  She has been on this before and stopped it because she felt she was having the side effects from it. ----- Message -----    From: Louis Meckel, MD    Sent: 04/03/2015   8:47 AM      To: Austin Miles Inna Tisdell, LPN  Please order domperidone 10 mg one half hour before each meal and at bedtime.  Patient needs office visit in approximate 6 weeks

## 2015-04-05 ENCOUNTER — Encounter: Payer: Self-pay | Admitting: Internal Medicine

## 2015-04-07 ENCOUNTER — Encounter: Payer: Self-pay | Admitting: Internal Medicine

## 2015-04-07 ENCOUNTER — Telehealth: Payer: Self-pay | Admitting: *Deleted

## 2015-04-07 ENCOUNTER — Other Ambulatory Visit: Payer: Self-pay | Admitting: Internal Medicine

## 2015-04-07 DIAGNOSIS — E871 Hypo-osmolality and hyponatremia: Secondary | ICD-10-CM

## 2015-04-07 LAB — ACTH: C206 ACTH: 6 pg/mL (ref 6–50)

## 2015-04-07 NOTE — Telephone Encounter (Signed)
[facility] - Client TELEPHONE ADVICE RECORD [facility] Patient Name: Meghan Park Gender: Female DOB: 1971-04-11 Age: 44 Y 1 M 23 D Return Phone Number: [phone] (Primary) Address: City/State/Zip: [place] [address] Client [facility] - Client Client Site [facility] Day Physician Sanda Linger Contact Type Call Call Type Triage / Clinical Relationship To Patient Self Appointment Disposition EMR Appointment Scheduled Info pasted into Epic Yes Return Phone Number [phone] (Primary) Chief Complaint Heart palpitations or irregular heartbeat Initial Comment Caller states, has new symptoms, heart palpitations when she is standing or walking around. Twice Yesterday she had tremors when she was laying down yesterday. PreDisposition Did not know what to do Nurse Assessment Nurse: Scarlette Ar, RN, Heather Date/Time (Eastern Time): 04/02/2015 8:49:38 AM Confirm and document reason for call. If symptomatic, describe symptoms. ---Caller states, has new symptoms, heart palpitations when she is standing or walking around. Twice Yesterday she had tremors when she was laying down yesterday. The palpitations started Saturday, the episodes last about 10 min and it feels like her heart is beating fast. Has the patient traveled out of the country within the last 30 days? ---Not Applicable Does the patient require triage? ---Yes Related visit to physician within the last 2 weeks? ---No Does the PT have any chronic conditions? (i.e. diabetes, asthma, etc.) ---Yes List chronic conditions. ---see MR Did the patient indicate they were pregnant? ---No Guidelines Guideline Title Affirmed Question Affirmed Notes Nurse Date/Time (Eastern Time) Heart Rate and Heartbeat Questions [1] Palpitations AND [2] no improvement after following Care Advice Standifer, RN, Heather 04/02/2015 8:51:16 AM Disp. Time Lamount Cohen Time)  Disposition Final User 04/02/2015 9:02:52 AM Call Completed Standifer, RN, Heather PLEASE NOTE: All timestamps contained within this report are represented as [place] Standard Time. CONFIDENTIALTY NOTICE: This fax transmission is intended only for the addressee. It contains information that is legally privileged, confidential or otherwise protected from use or disclosure. If you are not the intended recipient, you are strictly prohibited from reviewing, disclosing, copying using or disseminating any of this information or taking any action in reliance on or regarding this information. If you have received this fax in error, please notify us immediately by telephone so that we can arrange for its return to [place]. Phone: [phone], Toll-Free: [phone], Fax: [phone] Page: 2 of 2 Call Id: [id number] 04/02/2015 8:56:44 AM See PCP When Office is Open (within 3 days) Yes Standifer, RN, Administrator, [employer] Understands: Yes Disagree/Comply: Comply Care Advice Given Per Guideline SEE PCP WITHIN 3 DAYS: * You need to be seen within 2 or 3 days. Call your doctor during regular office hours and make an appointment. An urgent care center is often the best source of care if your doctor's office is closed or you can't get an appointment. NOTE: If office will be open tomorrow, tell caller to call then, not in 3 days. CARE ADVICE given per Palpitations (Adult) guideline. CALL BACK IF: * You become worse. After Care Instructions Given Call Event Type User Date / Time Description Comments User: Tyrone Apple, RN Date/Time Lamount Cohen Time): 04/02/2015 8:58:17 AM Appt made with Dr. Yetta Barre today at 2 pm.

## 2015-04-09 ENCOUNTER — Other Ambulatory Visit: Payer: Self-pay | Admitting: Internal Medicine

## 2015-04-14 ENCOUNTER — Encounter: Payer: Self-pay | Admitting: Gastroenterology

## 2015-04-15 ENCOUNTER — Telehealth: Payer: Self-pay

## 2015-04-15 ENCOUNTER — Ambulatory Visit (INDEPENDENT_AMBULATORY_CARE_PROVIDER_SITE_OTHER): Payer: Managed Care, Other (non HMO) | Admitting: Endocrinology

## 2015-04-15 ENCOUNTER — Encounter: Payer: Self-pay | Admitting: Endocrinology

## 2015-04-15 ENCOUNTER — Other Ambulatory Visit: Payer: Self-pay

## 2015-04-15 VITALS — BP 122/84 | HR 122 | Temp 98.4°F | Ht 63.0 in | Wt 189.0 lb

## 2015-04-15 DIAGNOSIS — E871 Hypo-osmolality and hyponatremia: Secondary | ICD-10-CM

## 2015-04-15 LAB — CORTISOL
Cortisol, Plasma: 7.4 ug/dL
Cortisol, Plasma: 24.5 ug/dL

## 2015-04-15 LAB — BASIC METABOLIC PANEL WITH GFR
BUN: 9 mg/dL (ref 6–23)
CO2: 28 meq/L (ref 19–32)
Calcium: 9.2 mg/dL (ref 8.4–10.5)
Chloride: 98 meq/L (ref 96–112)
Creatinine, Ser: 0.65 mg/dL (ref 0.40–1.20)
GFR: 105.15 mL/min
Glucose, Bld: 152 mg/dL — ABNORMAL HIGH (ref 70–99)
Potassium: 4.4 meq/L (ref 3.5–5.1)
Sodium: 134 meq/L — ABNORMAL LOW (ref 135–145)

## 2015-04-15 LAB — FOLLICLE STIMULATING HORMONE: FSH: 11.7 m[IU]/mL

## 2015-04-15 LAB — LUTEINIZING HORMONE: LH: 5.51 m[IU]/mL

## 2015-04-15 MED ORDER — DEMECLOCYCLINE HCL 150 MG PO TABS: 150.0000 mg | ORAL_TABLET | Freq: Every day | ORAL | Status: DC

## 2015-04-15 MED ORDER — COSYNTROPIN 0.25 MG IJ SOLR
0.2500 mg | Freq: Once | INTRAMUSCULAR | Status: AC
  Administered 2015-04-15: 0.25 mg via INTRAMUSCULAR

## 2015-04-15 MED ORDER — AMBULATORY NON FORMULARY MEDICATION: Status: DC

## 2015-04-15 NOTE — Progress Notes (Signed)
Subjective:    Patient ID: Meghan Park, female    DOB: 06/05/1971, 44 y.o.   MRN: [id number]  HPI Pt was first noted to have hyponatremia in early 2016.  She has no h/o any of these: Acei, diuretic rx, adrenal insuff, cirrhosis, CHF, thyroid dz, nephrotic synd, chronic pain, chylomicronemia, pulm dz, cancer, narcotics, GBS.  She takes chronic SSRI rx.   She has never had a brain injury, surgery or imaging.   She has 6 mos of moderate bloating at the abdomen, and assoc intermittent vomiting.   Past Medical History  Diagnosis Date  . DM (diabetes mellitus), type 2, uncontrolled   . Anxiety associated with depression   . Hip pain, right   . Depression   . Anxiety   . Pneumonia hx  . Cystitis   . Cholelithiasis   . Fatty liver   . Arthritis hip, right  . Insomnia   . Hypertension   . Nausea   . Anal fissure   . Chronic headaches   . HLD (hyperlipidemia)   . Pneumonia   . Diabetes mellitus, type II   . H pylori ulcer     Past Surgical History  Procedure Laterality Date  . Abdominal hysterectomy  2012  . Cholecystectomy  04/13/12  . Ercp  04/24/2012    Procedure: ENDOSCOPIC RETROGRADE CHOLANGIOPANCREATOGRAPHY (ERCP);  Surgeon: Theda Belfast, MD;  Location: [facility];  Service: Endoscopy;  Laterality: N/A;  . Tubal ligation    . Ercp  06/02/2012    Procedure: ENDOSCOPIC RETROGRADE CHOLANGIOPANCREATOGRAPHY (ERCP);  Surgeon: Theda Belfast, MD;  Location: [facility];  Service: Endoscopy;  Laterality: N/A;  . Gallbladder surgery  2013  . Cesarean section  [phone]    x2    Social History   Social History  . Marital Status: Married    Spouse Name: N/A  . Number of Children: 2  . Years of Education: N/A   Occupational History  . coordiantor    Social History Main Topics  . Smoking status: Current Every Day Smoker -- 1.00 packs/day for 23 years    Types: Cigarettes  . Smokeless tobacco: Never Used     Comment: Tobacco info given 02/25/15  . Alcohol Use: No   Comment: quit 18 yrs ago  . Drug Use: No  . Sexual Activity: Yes    Birth Control/ Protection: Other-see comments     Comment: hysterectomy   Other Topics Concern  . Not on file   Social History Narrative   married   Employment: data entry   [employer] daily    Current Outpatient Prescriptions on File Prior to Visit  Medication Sig Dispense Refill  . clonazePAM (KLONOPIN) 1 MG tablet TAKE 1/2 TO 1 TABLET BY MOUTH EVERY DAY AS NEEDED FOR ANXIETY 30 tablet 3  . cyclobenzaprine (FLEXERIL) 10 MG tablet TAKE 1 TABLET BY MOUTH AT BEDTIME AS NEEDED FOR MUSCLE SPASMS 30 tablet 11  . gabapentin (NEURONTIN) 100 MG capsule     . hydrOXYzine (VISTARIL) 25 MG capsule Take 3 capsules (75 mg total) by mouth at bedtime as needed. 90 capsule 4  . Hyoscyamine Sulfate 0.375 MG TBCR Take 1 tablet (0.375 mg total) by mouth 2 (two) times daily. 60 each 0  . ondansetron (ZOFRAN) 4 MG tablet Take 1 tablet (4 mg total) by mouth 2 (two) times daily. 60 tablet 5  . sertraline (ZOLOFT) 100 MG tablet Take 1.5 tablets (150 mg total) by mouth daily.  TAKE 1 TABLET(100 MG) BY MOUTH DAILY 45 tablet 4   No current facility-administered medications on file prior to visit.    Allergies  Allergen Reactions  . Januvia [Sitagliptin]     GI upset  . Morphine And Related Dermatitis and Other (See Comments)    Severe itching leading to welts and open wounds.  Betsey Amen [Promethazine Hcl] Other (See Comments)    TD  . Reglan [Metoclopramide] Other (See Comments)    Tremors, jittery  . Farxiga [Dapagliflozin]     yeast    Family History  Problem Relation Age of Onset  . Anxiety disorder Mother   . Hyperlipidemia Mother   . Irritable bowel syndrome Mother   . Arthritis Father   . COPD Father   . Heart disease Father   . Heart disease Maternal Grandfather   . Congestive Heart Failure Maternal Grandmother   . Heart disease Maternal Grandmother   . Heart disease Paternal Grandfather   . Breast  cancer Paternal Grandmother     great  . Diabetes Maternal Aunt   . Anxiety disorder Son     social anxiety  . Alcohol abuse Maternal Uncle   . Colon cancer Neg Hx   . Uterine cancer Maternal Aunt     great  . Other Neg Hx     hyponatremia    BP 122/84 mmHg  Pulse 122  Temp(Src) 98.4 F (36.9 C) (Oral)  Ht  (1.6 m)  Wt 189 lb (85.73 kg)  BMI 33.49 kg/m2  SpO2 97%  Review of Systems  Eyes: Negative for visual disturbance.  Respiratory: Negative for shortness of breath.   Endocrine: Negative for polyuria.  Skin: Negative for rash.  Neurological: Negative for headaches.  Hematological: Does not bruise/bleed easily.   She has weight gain, anxiety, tremor, low-back pain, intermittent diarrhea, and palpitations.      Objective:   Physical Exam VS: see vs page GEN: no distress HEAD: head: no deformity eyes: no periorbital swelling, no proptosis external nose and ears are normal mouth: no lesion seen NECK: supple, thyroid is not enlarged CHEST WALL: no deformity LUNGS:  Clear to auscultation CV: tachycardic rate but reg rhythm, no murmur. ABD: abdomen is soft, nontender.  no hepatosplenomegaly.  not distended.  no hernia MUSCULOSKELETAL: muscle bulk and strength are grossly normal.  no obvious joint swelling.  gait is normal and steady EXTEMITIES: no deformity.  no ulcer on the feet.  feet are of normal color and temp.  no edema PULSES: dorsalis pedis intact bilat.  no carotid bruit NEURO:  cn 2-12 grossly intact.   readily moves all 4's.  sensation is intact to touch on the feet SKIN:  Normal texture and temperature.  No rash or suspicious lesion is visible.   NODES:  None palpable at the neck.  PSYCH: alert, well-oriented.  Does not appear anxious nor depressed.   Lab Results  Component Value Date   CHOL 158 04/15/2014   HDL 31.80* 04/15/2014   LDLCALC 96 04/15/2014   TRIG 153.0* 04/15/2014   CHOLHDL 5 04/15/2014   acth stimulation test is done: baseline  cortisol level=7 then cosyntropin 250 mcg is given im 45 minutes later, cortisol level=25 (normal response)  Lab Results  Component Value Date   TSH 1.37 04/02/2015   T4TOTAL 9.1 04/02/2015   Lab Results  Component Value Date   ALT 11 11/21/2014   AST 14 11/21/2014   ALKPHOS 75 11/21/2014   BILITOT 0.9 11/21/2014  i personally reviewed electrocardiogram tracing (04/02/15): Indication: tachycardia Impression: sinus tachycardia    Assessment & Plan:  Hyponatremia, new to me, mild: SSRI and/or n/v may contribute.  As it is mild, I don't think it merits brain MRI. Chronic tachycardia.  This may be another sign of autonomic dysfunction.   Patient is advised the following: Patient Instructions  blood tests are requested for you today.  We'll let you know about the results. i have sent a prescription to your pharmacy, for the low salt level. Try to limit fluid intake to 1/2 gallon per day. Please come back for a follow-up appointment in 1 month.

## 2015-04-15 NOTE — Telephone Encounter (Signed)
-----   Message from Louis Meckel, MD sent at 04/15/2015  8:43 AM EDT ----- Please order her domperidone  ac and hs. OV 3-4 weeks after trying med.

## 2015-04-15 NOTE — Telephone Encounter (Signed)
email sent to the patient. Rx printed. "Rx Meds via [place]" form printed for the patient.

## 2015-04-15 NOTE — Patient Instructions (Signed)
blood tests are requested for you today.  We'll let you know about the results. i have sent a prescription to your pharmacy, for the low salt level. Try to limit fluid intake to 1/2 gallon per day. Please come back for a follow-up appointment in 1 month.

## 2015-04-21 ENCOUNTER — Other Ambulatory Visit: Payer: Self-pay | Admitting: Gastroenterology

## 2015-04-21 ENCOUNTER — Encounter: Payer: Self-pay | Admitting: Gastroenterology

## 2015-04-25 ENCOUNTER — Encounter: Payer: Self-pay | Admitting: Internal Medicine

## 2015-04-28 ENCOUNTER — Encounter: Payer: Self-pay | Admitting: Gastroenterology

## 2015-04-28 NOTE — Telephone Encounter (Signed)
Please advise. Dr. Yetta Barre out of office

## 2015-04-29 ENCOUNTER — Encounter: Payer: Self-pay | Admitting: Gastroenterology

## 2015-04-29 NOTE — Progress Notes (Signed)
     03/25/2015 Meghan Park [id number] [birth year]   History of Present Illness:  This is a 44 year old female who is known to Dr. Arlyce Dice.  Just seen last week.  Has gastroparesis and IBS with continued complaints of nausea/vomiting, abdominal pain.  Apparently previously had constipation and on amitiza but now with ongoing complaints of diarrhea (no longer taking amitiza).  Previously was treated for suspected SIBO with flagyl and diarrhea had subsided for a short time.  Previously was on Reglan but apparently had a reaction.    Was to be considered for gastroparesis trial but research does not think she is a candidate.  Tells me that her stools are dark in color, almost black.  Has not taken any Pepto or iron (she is heme negative in the office today).    Current Medications, Allergies, Past Medical History, Past Surgical History, Family History and Social History were reviewed in [employer] record.   Physical Exam: BP 116/84 mmHg  Pulse 102  Ht  (1.6 m)  Wt 186 lb 6.4 oz (84.55 kg)  BMI 33.03 kg/m2 General: Well developed white female in no acute distress Head: Normocephalic and atraumatic Eyes:  Sclerae anicteric, conjunctiva pink  Ears: Normal auditory acuity Lungs: Clear throughout to auscultation Heart: Mildly tachycardic.  No murmurs noted. Abdomen: Soft, non-distended.  BS present.  Mild diffuse TTP. Rectal:  Heme negative stool. Musculoskeletal: Symmetrical with no gross deformities  Extremities: No edema  Neurological: Alert oriented x 4, grossly non-focal Psychological:  Alert and cooperative. Normal mood and affect  Assessment and Recommendations: -Gastroparesis:  Will try to get domperidone 10 mg daily before meals and at bedtime.  Not a candidate for gastroparesis trial. -IBS with diarrhea/abdominal pain:  Will repeat course of Flagyl 250 mg TID for 10 days.  Will try Hyomax BID.

## 2015-05-05 ENCOUNTER — Telehealth: Payer: Self-pay

## 2015-05-05 NOTE — Telephone Encounter (Signed)
Lab called and stated they were not able to result the Arginine vasopressin hormone. Patient has an follow up appointment on 10/20.

## 2015-05-05 NOTE — Progress Notes (Signed)
Reviewed and agree with management. Jawanna Dykman D. Matvey Llanas, M.D., FACG  

## 2015-05-06 ENCOUNTER — Ambulatory Visit: Payer: Self-pay | Admitting: Gastroenterology

## 2015-05-13 ENCOUNTER — Encounter (HOSPITAL_COMMUNITY): Payer: Self-pay | Admitting: Psychiatry

## 2015-05-13 ENCOUNTER — Ambulatory Visit (INDEPENDENT_AMBULATORY_CARE_PROVIDER_SITE_OTHER): Payer: Managed Care, Other (non HMO) | Admitting: Psychiatry

## 2015-05-13 VITALS — BP 112/82 | HR 64 | Resp 12 | Wt 194.4 lb

## 2015-05-13 DIAGNOSIS — F4323 Adjustment disorder with mixed anxiety and depressed mood: Secondary | ICD-10-CM | POA: Diagnosis not present

## 2015-05-13 DIAGNOSIS — F331 Major depressive disorder, recurrent, moderate: Secondary | ICD-10-CM | POA: Diagnosis not present

## 2015-05-13 DIAGNOSIS — F411 Generalized anxiety disorder: Secondary | ICD-10-CM | POA: Diagnosis not present

## 2015-05-13 MED ORDER — SERTRALINE HCL 100 MG PO TABS: 150.0000 mg | ORAL_TABLET | Freq: Every day | ORAL | Status: DC

## 2015-05-13 MED ORDER — MIRTAZAPINE 15 MG PO TBDP: 15.0000 mg | ORAL_TABLET | Freq: Every day | ORAL | Status: DC
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
## 2015-05-15 ENCOUNTER — Ambulatory Visit (INDEPENDENT_AMBULATORY_CARE_PROVIDER_SITE_OTHER): Payer: Managed Care, Other (non HMO) | Admitting: Endocrinology

## 2015-05-15 ENCOUNTER — Telehealth (HOSPITAL_COMMUNITY): Payer: Self-pay

## 2015-05-15 ENCOUNTER — Encounter: Payer: Self-pay | Admitting: Endocrinology

## 2015-05-15 VITALS — BP 110/88 | HR 141 | Temp 98.5°F | Resp 16 | Ht 63.0 in | Wt 194.0 lb

## 2015-05-15 DIAGNOSIS — E871 Hypo-osmolality and hyponatremia: Secondary | ICD-10-CM

## 2015-05-15 DIAGNOSIS — R252 Cramp and spasm: Secondary | ICD-10-CM

## 2015-05-15 LAB — VITAMIN D 25 HYDROXY (VIT D DEFICIENCY, FRACTURES): VITD: 14.54 ng/mL — ABNORMAL LOW (ref 30.00–100.00)

## 2015-05-15 LAB — BASIC METABOLIC PANEL
BUN: 10 mg/dL (ref 6–23)
CO2: 28 meq/L (ref 19–32)
Calcium: 9.5 mg/dL (ref 8.4–10.5)
Chloride: 97 mEq/L (ref 96–112)
Creatinine, Ser: 0.73 mg/dL (ref 0.40–1.20)
GFR: 91.94 mL/min (ref >60.00)
GLUCOSE: 239 mg/dL — AB (ref 70–99)
POTASSIUM: 4.3 meq/L (ref 3.5–5.1)
SODIUM: 133 meq/L — AB (ref 135–145)

## 2015-05-15 MED ORDER — VITAMIN D (ERGOCALCIFEROL) 1.25 MG (50000 UNIT) PO CAPS: 50000.0000 [IU] | ORAL_CAPSULE | ORAL | Status: DC

## 2015-05-15 NOTE — Progress Notes (Signed)
Subjective:    Patient ID: Meghan Park, female    DOB: 06-07-1971, 44 y.o.   MRN: [id number]  HPI Pt returns for f/u of hyponatremia (dx'ed early 2016; too mild to merit brain imaging;only possible causes found were SSRI and/or n/v; these were normal: ACTH stim test and TFT; she also has chronic tachycardia--possibly due to autonomic neuropathy).  Pt reports moderate cramping of the muscles, worse at the arms, and assoc low-back pain. Past Medical History  Diagnosis Date  . DM (diabetes mellitus), type 2, uncontrolled (HCC)   . Anxiety associated with depression   . Hip pain, right   . Depression   . Anxiety   . Pneumonia hx  . Cystitis   . Cholelithiasis   . Fatty liver   . Arthritis hip, right  . Insomnia   . Hypertension   . Nausea   . Anal fissure   . Chronic headaches   . HLD (hyperlipidemia)   . Pneumonia   . Diabetes mellitus, type II (HCC)   . H pylori ulcer   . Gastroparesis   . Adrenal insufficiency [facility])     Past Surgical History  Procedure Laterality Date  . Abdominal hysterectomy  2012  . Cholecystectomy  04/13/12  . Ercp  04/24/2012    Procedure: ENDOSCOPIC RETROGRADE CHOLANGIOPANCREATOGRAPHY (ERCP);  Surgeon: Theda Belfast, MD;  Location: [facility];  Service: Endoscopy;  Laterality: N/A;  . Tubal ligation    . Ercp  06/02/2012    Procedure: ENDOSCOPIC RETROGRADE CHOLANGIOPANCREATOGRAPHY (ERCP);  Surgeon: Theda Belfast, MD;  Location: [facility];  Service: Endoscopy;  Laterality: N/A;  . Gallbladder surgery  2013  . Cesarean section  [phone]    x2    Social History   Social History  . Marital Status: Married    Spouse Name: N/A  . Number of Children: 2  . Years of Education: N/A   Occupational History  . coordiantor    Social History Main Topics  . Smoking status: Current Every Day Smoker -- 1.00 packs/day for 23 years    Types: Cigarettes  . Smokeless tobacco: Never Used     Comment: Tobacco info given 02/25/15  . Alcohol Use: No    Comment: quit 18 yrs ago  . Drug Use: No  . Sexual Activity: Yes    Birth Control/ Protection: Other-see comments     Comment: hysterectomy   Other Topics Concern  . Not on file   Social History Narrative   married   Employment: data entry   [employer] daily    Current Outpatient Prescriptions on File Prior to Visit  Medication Sig Dispense Refill  . AMBULATORY NON FORMULARY MEDICATION Medication Name: Domperidone 10 mg  Sig: 1 tablet by mouth daily before meals and once at bedtime for gastroparesis 120 tablet 5  . clonazePAM (KLONOPIN) 1 MG tablet TAKE 1/2 TO 1 TABLET BY MOUTH EVERY DAY AS NEEDED FOR ANXIETY 30 tablet 3  . cyclobenzaprine (FLEXERIL) 10 MG tablet TAKE 1 TABLET BY MOUTH AT BEDTIME AS NEEDED FOR MUSCLE SPASMS 30 tablet 11  . demeclocycline (DECLOMYCIN) 150 MG tablet Take 1 tablet (150 mg total) by mouth daily. 30 tablet 11  . gabapentin (NEURONTIN) 100 MG capsule     . hyoscyamine (LEVBID) 0.375 MG 12 hr tablet TAKE 1 TABLET(0.375 MG) BY MOUTH TWICE DAILY 60 tablet 0  . mirtazapine (REMERON SOL-TAB) 15 MG disintegrating tablet Take 1 tablet (15 mg total) by mouth at bedtime.  30 tablet 1  . ondansetron (ZOFRAN) 4 MG tablet Take 1 tablet (4 mg total) by mouth 2 (two) times daily. 60 tablet 5  . sertraline (ZOLOFT) 100 MG tablet Take 1.5 tablets (150 mg total) by mouth daily. TAKE 1 TABLET(100 MG) BY MOUTH DAILY 45 tablet 1   No current facility-administered medications on file prior to visit.    Allergies  Allergen Reactions  . Januvia [Sitagliptin]     GI upset  . Morphine And Related Dermatitis and Other (See Comments)    Severe itching leading to welts and open wounds.  Betsey Amen. Phenergan [Promethazine Hcl] Other (See Comments)    TD  . Reglan [Metoclopramide] Other (See Comments)    Tremors, jittery  . Farxiga [Dapagliflozin]     yeast    Family History  Problem Relation Age of Onset  . Anxiety disorder Mother   . Hyperlipidemia Mother   .  Irritable bowel syndrome Mother   . Arthritis Father   . COPD Father   . Heart disease Father   . Heart disease Maternal Grandfather   . Congestive Heart Failure Maternal Grandmother   . Heart disease Maternal Grandmother   . Heart disease Paternal Grandfather   . Breast cancer Paternal Grandmother     great  . Diabetes Maternal Aunt   . Anxiety disorder Son     social anxiety  . Alcohol abuse Maternal Uncle   . Colon cancer Neg Hx   . Uterine cancer Maternal Aunt     great  . Other Neg Hx     hyponatremia    BP 110/88 mmHg  Pulse 141  Temp(Src) 98.5 F (36.9 C)  Resp 16  Ht 5\' 3"  (1.6 m)  Wt 194 lb (87.998 kg)  BMI 34.37 kg/m2  SpO2 97%   Review of Systems Denies edema, but she still has intermittent palpitations    Objective:   Physical Exam VITAL SIGNS:  See vs page GENERAL: no distress Ext: no edema   Lab Results  Component Value Date   CREATININE 0.73 05/15/2015   BUN 10 05/15/2015   NA 133* 05/15/2015   K 4.3 05/15/2015   CL 97 05/15/2015   CO2 28 05/15/2015      Assessment & Plan:  Hyponatremia, persistent but mild Tachycardia: not explained by hypokalemia. Cramps, new, uncertain etiology  Patient is advised the following: Patient Instructions  blood tests are requested for you today.  We'll let you know about the results Based on the results, we may need to increase the demeclomycin, or add potassium.   Try to limit fluid intake to 1/2 gallon per day.  Please come back for a follow-up appointment in 3 months.

## 2015-05-15 NOTE — Patient Instructions (Addendum)
blood tests are requested for you today.  We'll let you know about the results Based on the results, we may need to increase the demeclomycin, or add potassium.   Try to limit fluid intake to 1/2 gallon per day.  Please come back for a follow-up appointment in 3 months.

## 2015-05-15 NOTE — Telephone Encounter (Signed)
[facility]phone call [facility] [place] on [address] to verify patient's correct Zoloft dosage of 150mg  a day, 1.5 tablets daily, #45.  Spoke with Zen, pharmacist who verified the order and will fill as was directed from review of patient's evaluation note by Dr. Michae KavaAgarwal form 05/13/15.

## 2015-05-26 ENCOUNTER — Encounter: Payer: Self-pay | Admitting: Physician Assistant

## 2015-05-26 ENCOUNTER — Other Ambulatory Visit: Payer: Self-pay | Admitting: Gastroenterology

## 2015-05-26 ENCOUNTER — Other Ambulatory Visit: Payer: Self-pay | Admitting: Internal Medicine

## 2015-05-26 LAB — ARGININE VASOPRESSIN HORMONE

## 2015-05-26 NOTE — Telephone Encounter (Signed)
Will you please also see if she ever got started on domperidone for her gastroparesis and let me know.  Thank you,  Jess

## 2015-05-26 NOTE — Telephone Encounter (Signed)
Ok to refill Levbid?

## 2015-05-26 NOTE — Telephone Encounter (Signed)
Spoke to patient and she states that [facility] went [facility] Ethelene HalEvens on Friday 05-23-15. Explained to patient that she needs to request her records [facility] and bring them to our office for review. From there she will be considered as a new patient if accepted. Patient verbalized understanding and will request her records.

## 2015-05-26 NOTE — Telephone Encounter (Signed)
Ok to refill 

## 2015-05-26 NOTE — Telephone Encounter (Signed)
Spoke to patient and she did not start the domperidone because it was not FDA approved and she did not like the side effects. Patient also states that she went over to [facility] Dr. Ethelene HalEvens on Friday 05-23-15. Explained to patient that she needs to request her [facility] and bring them to our office for review. From there she will be considered as a new patient if accepted.

## 2015-05-27 ENCOUNTER — Encounter: Payer: Self-pay | Admitting: Physician Assistant

## 2015-05-27 ENCOUNTER — Telehealth: Payer: Self-pay | Admitting: Internal Medicine

## 2015-05-27 NOTE — Telephone Encounter (Signed)
Received records from [facility]. Patient used to see Dr. Arlyce DiceKaplan. Records placed on Dr. Leone PayorGessner desk for review.

## 2015-05-29 NOTE — Telephone Encounter (Signed)
Dr. Leone PayorGessner declined to accept patient. Records placed on Dr. Lamar SprinklesPerry's desk for review.

## 2015-06-02 ENCOUNTER — Encounter: Payer: Self-pay | Admitting: Internal Medicine

## 2015-06-02 MED ORDER — HYOSCYAMINE SULFATE ER 0.375 MG PO TB12: 0.3750 mg | ORAL_TABLET | Freq: Two times a day (BID) | ORAL | Status: DC | PRN

## 2015-06-02 NOTE — Telephone Encounter (Signed)
Dr. Marina GoodellPerry declined to accept. Called and informed patient of this.

## 2015-06-02 NOTE — Telephone Encounter (Signed)
Please advise on pended med per pt email

## 2015-06-09 ENCOUNTER — Other Ambulatory Visit: Payer: Self-pay | Admitting: Internal Medicine

## 2015-06-09 ENCOUNTER — Encounter: Payer: Self-pay | Admitting: Internal Medicine

## 2015-06-09 DIAGNOSIS — R310 Gross hematuria: Secondary | ICD-10-CM | POA: Insufficient documentation

## 2015-06-10 ENCOUNTER — Other Ambulatory Visit (INDEPENDENT_AMBULATORY_CARE_PROVIDER_SITE_OTHER): Payer: Managed Care, Other (non HMO)

## 2015-06-10 ENCOUNTER — Encounter: Payer: Self-pay | Admitting: Internal Medicine

## 2015-06-10 DIAGNOSIS — R310 Gross hematuria: Secondary | ICD-10-CM

## 2015-06-10 LAB — URINALYSIS, ROUTINE W REFLEX MICROSCOPIC
BILIRUBIN URINE: NEGATIVE
Ketones, ur: NEGATIVE
LEUKOCYTES UA: NEGATIVE
NITRITE: NEGATIVE
RBC / HPF: NONE SEEN (ref 0–?)
Specific Gravity, Urine: 1.01 (ref 1.000–1.030)
TOTAL PROTEIN, URINE-UPE24: NEGATIVE
Urine Glucose: >=1000 — AB
Urobilinogen, UA: 0.2 (ref 0.0–1.0)
pH: 5.5 (ref 5.0–8.0)

## 2015-06-11 ENCOUNTER — Encounter: Payer: Self-pay | Admitting: Internal Medicine

## 2015-06-12 ENCOUNTER — Other Ambulatory Visit: Payer: Self-pay | Admitting: Internal Medicine

## 2015-06-12 ENCOUNTER — Encounter: Payer: Self-pay | Admitting: Internal Medicine

## 2015-06-12 DIAGNOSIS — B952 Enterococcus as the cause of diseases classified elsewhere: Secondary | ICD-10-CM

## 2015-06-12 DIAGNOSIS — N390 Urinary tract infection, site not specified: Secondary | ICD-10-CM | POA: Insufficient documentation

## 2015-06-12 MED ORDER — AMPICILLIN 500 MG PO CAPS: 500.0000 mg | ORAL_CAPSULE | Freq: Three times a day (TID) | ORAL | Status: DC

## 2015-06-13 LAB — CULTURE, URINE COMPREHENSIVE

## 2015-06-17 ENCOUNTER — Encounter: Payer: Self-pay | Admitting: Internal Medicine

## 2015-06-17 ENCOUNTER — Ambulatory Visit (INDEPENDENT_AMBULATORY_CARE_PROVIDER_SITE_OTHER)
Admission: RE | Admit: 2015-06-17 | Discharge: 2015-06-17 | Disposition: A | Discharge status: Home / Self Care | Payer: Managed Care, Other (non HMO) | Source: Ambulatory Visit | Attending: Internal Medicine | Admitting: Internal Medicine

## 2015-06-17 ENCOUNTER — Other Ambulatory Visit (INDEPENDENT_AMBULATORY_CARE_PROVIDER_SITE_OTHER): Payer: Managed Care, Other (non HMO)

## 2015-06-17 ENCOUNTER — Ambulatory Visit (INDEPENDENT_AMBULATORY_CARE_PROVIDER_SITE_OTHER): Payer: Managed Care, Other (non HMO) | Admitting: Internal Medicine

## 2015-06-17 VITALS — BP 108/78 | HR 119 | Temp 98.3°F | Resp 16 | Ht 63.0 in | Wt 200.0 lb

## 2015-06-17 DIAGNOSIS — R05 Cough: Secondary | ICD-10-CM | POA: Diagnosis not present

## 2015-06-17 DIAGNOSIS — R7989 Other specified abnormal findings of blood chemistry: Secondary | ICD-10-CM | POA: Diagnosis not present

## 2015-06-17 DIAGNOSIS — N390 Urinary tract infection, site not specified: Secondary | ICD-10-CM

## 2015-06-17 DIAGNOSIS — E1140 Type 2 diabetes mellitus with diabetic neuropathy, unspecified: Secondary | ICD-10-CM

## 2015-06-17 DIAGNOSIS — K580 Irritable bowel syndrome with diarrhea: Secondary | ICD-10-CM

## 2015-06-17 DIAGNOSIS — R739 Hyperglycemia, unspecified: Secondary | ICD-10-CM

## 2015-06-17 DIAGNOSIS — Z0000 Encounter for general adult medical examination without abnormal findings: Secondary | ICD-10-CM | POA: Diagnosis not present

## 2015-06-17 DIAGNOSIS — R0602 Shortness of breath: Secondary | ICD-10-CM

## 2015-06-17 DIAGNOSIS — B952 Enterococcus as the cause of diseases classified elsewhere: Secondary | ICD-10-CM

## 2015-06-17 DIAGNOSIS — J449 Chronic obstructive pulmonary disease, unspecified: Secondary | ICD-10-CM | POA: Diagnosis not present

## 2015-06-17 DIAGNOSIS — K3184 Gastroparesis: Secondary | ICD-10-CM

## 2015-06-17 DIAGNOSIS — E118 Type 2 diabetes mellitus with unspecified complications: Secondary | ICD-10-CM

## 2015-06-17 DIAGNOSIS — Z794 Long term (current) use of insulin: Secondary | ICD-10-CM

## 2015-06-17 DIAGNOSIS — R059 Cough, unspecified: Secondary | ICD-10-CM

## 2015-06-17 LAB — HEMOGLOBIN A1C: Hgb A1c MFr Bld: 11 % — ABNORMAL HIGH (ref 4.6–6.5)

## 2015-06-17 LAB — TSH: TSH: 1.33 u[IU]/mL (ref 0.35–4.50)

## 2015-06-17 LAB — URINALYSIS, ROUTINE W REFLEX MICROSCOPIC
Bilirubin Urine: NEGATIVE
Ketones, ur: NEGATIVE
LEUKOCYTES UA: NEGATIVE
Nitrite: NEGATIVE
SPECIFIC GRAVITY, URINE: 1.01 (ref 1.000–1.030)
Total Protein, Urine: NEGATIVE
Urobilinogen, UA: 0.2 (ref 0.0–1.0)
pH: 5.5 (ref 5.0–8.0)

## 2015-06-17 LAB — LIPID PANEL
CHOL/HDL RATIO: 7
Cholesterol: 274 mg/dL — ABNORMAL HIGH (ref 0–200)
HDL: 40.4 mg/dL (ref >39.00)
Triglycerides: 433 mg/dL — ABNORMAL HIGH (ref 0.0–149.0)

## 2015-06-17 LAB — BASIC METABOLIC PANEL
BUN: 10 mg/dL (ref 6–23)
CALCIUM: 9 mg/dL (ref 8.4–10.5)
CO2: 22 mEq/L (ref 19–32)
Chloride: 95 mEq/L — ABNORMAL LOW (ref 96–112)
Creatinine, Ser: 0.75 mg/dL (ref 0.40–1.20)
GFR: 89.08 mL/min (ref >60.00)
GLUCOSE: 287 mg/dL — AB (ref 70–99)
Potassium: 3.8 mEq/L (ref 3.5–5.1)
SODIUM: 131 meq/L — AB (ref 135–145)

## 2015-06-17 LAB — LDL CHOLESTEROL, DIRECT: Direct LDL: 168 mg/dL

## 2015-06-17 MED ORDER — TIOTROPIUM BROMIDE-OLODATEROL 2.5-2.5 MCG/ACT IN AERS: 2.0000 | INHALATION_SPRAY | Freq: Every day | RESPIRATORY_TRACT | Status: DC

## 2015-06-17 MED ORDER — GABAPENTIN 100 MG PO CAPS: 100.0000 mg | ORAL_CAPSULE | Freq: Three times a day (TID) | ORAL | Status: AC

## 2015-06-17 NOTE — Patient Instructions (Signed)
Preventive Care for Adults, Female A healthy lifestyle and preventive care can promote health and wellness. Preventive health guidelines for women include the following key practices.  A routine yearly physical is a good way to check with your health care provider about your health and preventive screening. It is a chance to share any concerns and updates on your health and to receive a thorough exam.  Visit your dentist for a routine exam and preventive care every 6 months. Brush your teeth twice a day and floss once a day. Good oral hygiene prevents tooth decay and gum disease.  The frequency of eye exams is based on your age, health, family medical history, use of contact lenses, and other factors. Follow your health care provider's recommendations for frequency of eye exams.  Eat a healthy diet. Foods like vegetables, fruits, whole grains, low-fat dairy products, and lean protein foods contain the nutrients you need without too many calories. Decrease your intake of foods high in solid fats, added sugars, and salt. Eat the right amount of calories for you.Get information about a proper diet from your health care provider, if necessary.  Regular physical exercise is one of the most important things you can do for your health. Most adults should get at least 150 minutes of moderate-intensity exercise (any activity that increases your heart rate and causes you to sweat) each week. In addition, most adults need muscle-strengthening exercises on 2 or more days a week.  Maintain a healthy weight. The body mass index (BMI) is a screening tool to identify possible weight problems. It provides an estimate of body fat based on height and weight. Your health care provider can find your BMI and can help you achieve or maintain a healthy weight.For adults 20 years and older:  A BMI below 18.5 is considered underweight.  A BMI of 18.5 to 24.9 is normal.  A BMI of 25 to 29.9 is considered overweight.  A  BMI of 30 and above is considered obese.  Maintain normal blood lipids and cholesterol levels by exercising and minimizing your intake of saturated fat. Eat a balanced diet with plenty of fruit and vegetables. Blood tests for lipids and cholesterol should begin at age 45 and be repeated every 5 years. If your lipid or cholesterol levels are high, you are over 50, or you are at high risk for heart disease, you may need your cholesterol levels checked more frequently.Ongoing high lipid and cholesterol levels should be treated with medicines if diet and exercise are not working.  If you smoke, find out from your health care provider how to quit. If you do not use tobacco, do not start.  Lung cancer screening is recommended for adults aged 45-80 years who are at high risk for developing lung cancer because of a history of smoking. A yearly low-dose CT scan of the lungs is recommended for people who have at least a 30-pack-year history of smoking and are a current smoker or have quit within the past 15 years. A pack year of smoking is smoking an average of 1 pack of cigarettes a day for 1 year (for example: 1 pack a day for 30 years or 2 packs a day for 15 years). Yearly screening should continue until the smoker has stopped smoking for at least 15 years. Yearly screening should be stopped for people who develop a health problem that would prevent them from having lung cancer treatment.  If you are pregnant, do not drink alcohol. If you are  breastfeeding, be very cautious about drinking alcohol. If you are not pregnant and choose to drink alcohol, do not have more than 1 drink per day. One drink is considered to be 12 ounces (355 mL) of beer, 5 ounces (148 mL) of wine, or 1.5 ounces (44 mL) of liquor.  Avoid use of street drugs. Do not share needles with anyone. Ask for help if you need support or instructions about stopping the use of drugs.  High blood pressure causes heart disease and increases the risk  of stroke. Your blood pressure should be checked at least every 1 to 2 years. Ongoing high blood pressure should be treated with medicines if weight loss and exercise do not work.  If you are 55-79 years old, ask your health care provider if you should take aspirin to prevent strokes.  Diabetes screening is done by taking a blood sample to check your blood glucose level after you have not eaten for a certain period of time (fasting). If you are not overweight and you do not have risk factors for diabetes, you should be screened once every 3 years starting at age 45. If you are overweight or obese and you are 40-70 years of age, you should be screened for diabetes every year as part of your cardiovascular risk assessment.  Breast cancer screening is essential preventive care for women. You should practice "breast self-awareness." This means understanding the normal appearance and feel of your breasts and may include breast self-examination. Any changes detected, no matter how small, should be reported to a health care provider. Women in their 20s and 30s should have a clinical breast exam (CBE) by a health care provider as part of a regular health exam every 1 to 3 years. After age 40, women should have a CBE every year. Starting at age 40, women should consider having a mammogram (breast X-ray test) every year. Women who have a family history of breast cancer should talk to their health care provider about genetic screening. Women at a high risk of breast cancer should talk to their health care providers about having an MRI and a mammogram every year.  Breast cancer gene (BRCA)-related cancer risk assessment is recommended for women who have family members with BRCA-related cancers. BRCA-related cancers include breast, ovarian, tubal, and peritoneal cancers. Having family members with these cancers may be associated with an increased risk for harmful changes (mutations) in the breast cancer genes BRCA1 and  BRCA2. Results of the assessment will determine the need for genetic counseling and BRCA1 and BRCA2 testing.  Your health care provider may recommend that you be screened regularly for cancer of the pelvic organs (ovaries, uterus, and vagina). This screening involves a pelvic examination, including checking for microscopic changes to the surface of your cervix (Pap test). You may be encouraged to have this screening done every 3 years, beginning at age 21.  For women ages 30-65, health care providers may recommend pelvic exams and Pap testing every 3 years, or they may recommend the Pap and pelvic exam, combined with testing for human papilloma virus (HPV), every 5 years. Some types of HPV increase your risk of cervical cancer. Testing for HPV may also be done on women of any age with unclear Pap test results.  Other health care providers may not recommend any screening for nonpregnant women who are considered low risk for pelvic cancer and who do not have symptoms. Ask your health care provider if a screening pelvic exam is right for   you.  If you have had past treatment for cervical cancer or a condition that could lead to cancer, you need Pap tests and screening for cancer for at least 20 years after your treatment. If Pap tests have been discontinued, your risk factors (such as having a new sexual partner) need to be reassessed to determine if screening should resume. Some women have medical problems that increase the chance of getting cervical cancer. In these cases, your health care provider may recommend more frequent screening and Pap tests.  Colorectal cancer can be detected and often prevented. Most routine colorectal cancer screening begins at the age of 50 years and continues through age 75 years. However, your health care provider may recommend screening at an earlier age if you have risk factors for colon cancer. On a yearly basis, your health care provider may provide home test kits to check  for hidden blood in the stool. Use of a small camera at the end of a tube, to directly examine the colon (sigmoidoscopy or colonoscopy), can detect the earliest forms of colorectal cancer. Talk to your health care provider about this at age 50, when routine screening begins. Direct exam of the colon should be repeated every 5-10 years through age 75 years, unless early forms of precancerous polyps or small growths are found.  People who are at an increased risk for hepatitis B should be screened for this virus. You are considered at high risk for hepatitis B if:  You were born in a country where hepatitis B occurs often. Talk with your health care provider about which countries are considered high risk.  Your parents were born in a high-risk country and you have not received a shot to protect against hepatitis B (hepatitis B vaccine).  You have HIV or AIDS.  You use needles to inject street drugs.  You live with, or have sex with, someone who has hepatitis B.  You get hemodialysis treatment.  You take certain medicines for conditions like cancer, organ transplantation, and autoimmune conditions.  Hepatitis C blood testing is recommended for all people born from 1945 through 1965 and any individual with known risks for hepatitis C.  Practice safe sex. Use condoms and avoid high-risk sexual practices to reduce the spread of sexually transmitted infections (STIs). STIs include gonorrhea, chlamydia, syphilis, trichomonas, herpes, HPV, and human immunodeficiency virus (HIV). Herpes, HIV, and HPV are viral illnesses that have no cure. They can result in disability, cancer, and death.  You should be screened for sexually transmitted illnesses (STIs) including gonorrhea and chlamydia if:  You are sexually active and are younger than 24 years.  You are older than 24 years and your health care provider tells you that you are at risk for this type of infection.  Your sexual activity has changed  since you were last screened and you are at an increased risk for chlamydia or gonorrhea. Ask your health care provider if you are at risk.  If you are at risk of being infected with HIV, it is recommended that you take a prescription medicine daily to prevent HIV infection. This is called preexposure prophylaxis (PrEP). You are considered at risk if:  You are sexually active and do not regularly use condoms or know the HIV status of your partner(s).  You take drugs by injection.  You are sexually active with a partner who has HIV.  Talk with your health care provider about whether you are at high risk of being infected with HIV. If   you choose to begin PrEP, you should first be tested for HIV. You should then be tested every 3 months for as long as you are taking PrEP.  Osteoporosis is a disease in which the bones lose minerals and strength with aging. This can result in serious bone fractures or breaks. The risk of osteoporosis can be identified using a bone density scan. Women ages 67 years and over and women at risk for fractures or osteoporosis should discuss screening with their health care providers. Ask your health care provider whether you should take a calcium supplement or vitamin D to reduce the rate of osteoporosis.  Menopause can be associated with physical symptoms and risks. Hormone replacement therapy is available to decrease symptoms and risks. You should talk to your health care provider about whether hormone replacement therapy is right for you.  Use sunscreen. Apply sunscreen liberally and repeatedly throughout the day. You should seek shade when your shadow is shorter than you. Protect yourself by wearing long sleeves, pants, a wide-brimmed hat, and sunglasses year round, whenever you are outdoors.  Once a month, do a whole body skin exam, using a mirror to look at the skin on your back. Tell your health care provider of new moles, moles that have irregular borders, moles that  are larger than a pencil eraser, or moles that have changed in shape or color.  Stay current with required vaccines (immunizations).  Influenza vaccine. All adults should be immunized every year.  Tetanus, diphtheria, and acellular pertussis (Td, Tdap) vaccine. Pregnant women should receive 1 dose of Tdap vaccine during each pregnancy. The dose should be obtained regardless of the length of time since the last dose. Immunization is preferred during the 27th-36th week of gestation. An adult who has not previously received Tdap or who does not know her vaccine status should receive 1 dose of Tdap. This initial dose should be followed by tetanus and diphtheria toxoids (Td) booster doses every 10 years. Adults with an unknown or incomplete history of completing a 3-dose immunization series with Td-containing vaccines should begin or complete a primary immunization series including a Tdap dose. Adults should receive a Td booster every 10 years.  Varicella vaccine. An adult without evidence of immunity to varicella should receive 2 doses or a second dose if she has previously received 1 dose. Pregnant females who do not have evidence of immunity should receive the first dose after pregnancy. This first dose should be obtained before leaving the health care facility. The second dose should be obtained 4-8 weeks after the first dose.  Human papillomavirus (HPV) vaccine. Females aged 13-26 years who have not received the vaccine previously should obtain the 3-dose series. The vaccine is not recommended for use in pregnant females. However, pregnancy testing is not needed before receiving a dose. If a female is found to be pregnant after receiving a dose, no treatment is needed. In that case, the remaining doses should be delayed until after the pregnancy. Immunization is recommended for any person with an immunocompromised condition through the age of 61 years if she did not get any or all doses earlier. During the  3-dose series, the second dose should be obtained 4-8 weeks after the first dose. The third dose should be obtained 24 weeks after the first dose and 16 weeks after the second dose.  Zoster vaccine. One dose is recommended for adults aged 30 years or older unless certain conditions are present.  Measles, mumps, and rubella (MMR) vaccine. Adults born  before 1957 generally are considered immune to measles and mumps. Adults born in 1957 or later should have 1 or more doses of MMR vaccine unless there is a contraindication to the vaccine or there is laboratory evidence of immunity to each of the three diseases. A routine second dose of MMR vaccine should be obtained at least 28 days after the first dose for students attending postsecondary schools, health care workers, or international travelers. People who received inactivated measles vaccine or an unknown type of measles vaccine during 1963-1967 should receive 2 doses of MMR vaccine. People who received inactivated mumps vaccine or an unknown type of mumps vaccine before 1979 and are at high risk for mumps infection should consider immunization with 2 doses of MMR vaccine. For females of childbearing age, rubella immunity should be determined. If there is no evidence of immunity, females who are not pregnant should be vaccinated. If there is no evidence of immunity, females who are pregnant should delay immunization until after pregnancy. Unvaccinated health care workers born before 1957 who lack laboratory evidence of measles, mumps, or rubella immunity or laboratory confirmation of disease should consider measles and mumps immunization with 2 doses of MMR vaccine or rubella immunization with 1 dose of MMR vaccine.  Pneumococcal 13-valent conjugate (PCV13) vaccine. When indicated, a person who is uncertain of his immunization history and has no record of immunization should receive the PCV13 vaccine. All adults 65 years of age and older should receive this  vaccine. An adult aged 19 years or older who has certain medical conditions and has not been previously immunized should receive 1 dose of PCV13 vaccine. This PCV13 should be followed with a dose of pneumococcal polysaccharide (PPSV23) vaccine. Adults who are at high risk for pneumococcal disease should obtain the PPSV23 vaccine at least 8 weeks after the dose of PCV13 vaccine. Adults older than 44 years of age who have normal immune system function should obtain the PPSV23 vaccine dose at least 1 year after the dose of PCV13 vaccine.  Pneumococcal polysaccharide (PPSV23) vaccine. When PCV13 is also indicated, PCV13 should be obtained first. All adults aged 65 years and older should be immunized. An adult younger than age 65 years who has certain medical conditions should be immunized. Any person who resides in a nursing home or long-term care facility should be immunized. An adult smoker should be immunized. People with an immunocompromised condition and certain other conditions should receive both PCV13 and PPSV23 vaccines. People with human immunodeficiency virus (HIV) infection should be immunized as soon as possible after diagnosis. Immunization during chemotherapy or radiation therapy should be avoided. Routine use of PPSV23 vaccine is not recommended for American Indians, [place] Natives, or people younger than 65 years unless there are medical conditions that require PPSV23 vaccine. When indicated, people who have unknown immunization and have no record of immunization should receive PPSV23 vaccine. One-time revaccination 5 years after the first dose of PPSV23 is recommended for people aged 19-64 years who have chronic kidney failure, nephrotic syndrome, asplenia, or immunocompromised conditions. People who received 1-2 doses of PPSV23 before age 65 years should receive another dose of PPSV23 vaccine at age 65 years or later if at least 5 years have passed since the previous dose. Doses of PPSV23 are not  needed for people immunized with PPSV23 at or after age 65 years.  Meningococcal vaccine. Adults with asplenia or persistent complement component deficiencies should receive 2 doses of quadrivalent meningococcal conjugate (MenACWY-D) vaccine. The doses should be obtained   at least 2 months apart. Microbiologists working with certain meningococcal bacteria, [place] recruits, people at risk during an outbreak, and people who travel to or live in countries with a high rate of meningitis should be immunized. A first-year college student up through age 34 years who is living in a residence hall should receive a dose if she did not receive a dose on or after her 16th birthday. Adults who have certain high-risk conditions should receive one or more doses of vaccine.  Hepatitis A vaccine. Adults who wish to be protected from this disease, have certain high-risk conditions, work with hepatitis A-infected animals, work in hepatitis A research labs, or travel to or work in countries with a high rate of hepatitis A should be immunized. Adults who were previously unvaccinated and who anticipate close contact with an international adoptee during the first 60 days after arrival in the [place] from a country with a high rate of hepatitis A should be immunized.  Hepatitis B vaccine. Adults who wish to be protected from this disease, have certain high-risk conditions, may be exposed to blood or other infectious body fluids, are household contacts or sex partners of hepatitis B positive people, are clients or workers in certain care facilities, or travel to or work in countries with a high rate of hepatitis B should be immunized.  Haemophilus influenzae type b (Hib) vaccine. A previously unvaccinated person with asplenia or sickle cell disease or having a scheduled splenectomy should receive 1 dose of Hib vaccine. Regardless of previous immunization, a recipient of a hematopoietic stem cell transplant should receive a  3-dose series 6-12 months after her successful transplant. Hib vaccine is not recommended for adults with HIV infection. Preventive Services / Frequency Ages 35 to 4 years  Blood pressure check.** / Every 3-5 years.  Lipid and cholesterol check.** / Every 5 years beginning at age 60.  Clinical breast exam.** / Every 3 years for women in their 71s and 10s.  BRCA-related cancer risk assessment.** / For women who have family members with a BRCA-related cancer (breast, ovarian, tubal, or peritoneal cancers).  Pap test.** / Every 2 years from ages 76 through 26. Every 3 years starting at age 61 through age 76 or 93 with a history of 3 consecutive normal Pap tests.  HPV screening.** / Every 3 years from ages 37 through ages 60 to 51 with a history of 3 consecutive normal Pap tests.  Hepatitis C blood test.** / For any individual with known risks for hepatitis C.  Skin self-exam. / Monthly.  Influenza vaccine. / Every year.  Tetanus, diphtheria, and acellular pertussis (Tdap, Td) vaccine.** / Consult your health care provider. Pregnant women should receive 1 dose of Tdap vaccine during each pregnancy. 1 dose of Td every 10 years.  Varicella vaccine.** / Consult your health care provider. Pregnant females who do not have evidence of immunity should receive the first dose after pregnancy.  HPV vaccine. / 3 doses over 6 months, if 93 and younger. The vaccine is not recommended for use in pregnant females. However, pregnancy testing is not needed before receiving a dose.  Measles, mumps, rubella (MMR) vaccine.** / You need at least 1 dose of MMR if you were born in 1957 or later. You may also need a 2nd dose. For females of childbearing age, rubella immunity should be determined. If there is no evidence of immunity, females who are not pregnant should be vaccinated. If there is no evidence of immunity, females who are  pregnant should delay immunization until after pregnancy.  Pneumococcal  13-valent conjugate (PCV13) vaccine.** / Consult your health care provider.  Pneumococcal polysaccharide (PPSV23) vaccine.** / 1 to 2 doses if you smoke cigarettes or if you have certain conditions.  Meningococcal vaccine.** / 1 dose if you are age 68 to 8 years and a Market researcher living in a residence hall, or have one of several medical conditions, you need to get vaccinated against meningococcal disease. You may also need additional booster doses.  Hepatitis A vaccine.** / Consult your health care provider.  Hepatitis B vaccine.** / Consult your health care provider.  Haemophilus influenzae type b (Hib) vaccine.** / Consult your health care provider. Ages 7 to 53 years  Blood pressure check.** / Every year.  Lipid and cholesterol check.** / Every 5 years beginning at age 25 years.  Lung cancer screening. / Every year if you are aged 11-80 years and have a 30-pack-year history of smoking and currently smoke or have quit within the past 15 years. Yearly screening is stopped once you have quit smoking for at least 15 years or develop a health problem that would prevent you from having lung cancer treatment.  Clinical breast exam.** / Every year after age 48 years.  BRCA-related cancer risk assessment.** / For women who have family members with a BRCA-related cancer (breast, ovarian, tubal, or peritoneal cancers).  Mammogram.** / Every year beginning at age 41 years and continuing for as long as you are in good health. Consult with your health care provider.  Pap test.** / Every 3 years starting at age 65 years through age 37 or 70 years with a history of 3 consecutive normal Pap tests.  HPV screening.** / Every 3 years from ages 72 years through ages 60 to 40 years with a history of 3 consecutive normal Pap tests.  Fecal occult blood test (FOBT) of stool. / Every year beginning at age 21 years and continuing until age 5 years. You may not need to do this test if you get  a colonoscopy every 10 years.  Flexible sigmoidoscopy or colonoscopy.** / Every 5 years for a flexible sigmoidoscopy or every 10 years for a colonoscopy beginning at age 35 years and continuing until age 48 years.  Hepatitis C blood test.** / For all people born from [birth year] through 1965 and any individual with known risks for hepatitis C.  Skin self-exam. / Monthly.  Influenza vaccine. / Every year.  Tetanus, diphtheria, and acellular pertussis (Tdap/Td) vaccine.** / Consult your health care provider. Pregnant women should receive 1 dose of Tdap vaccine during each pregnancy. 1 dose of Td every 10 years.  Varicella vaccine.** / Consult your health care provider. Pregnant females who do not have evidence of immunity should receive the first dose after pregnancy.  Zoster vaccine.** / 1 dose for adults aged 30 years or older.  Measles, mumps, rubella (MMR) vaccine.** / You need at least 1 dose of MMR if you were born in 1957 or later. You may also need a second dose. For females of childbearing age, rubella immunity should be determined. If there is no evidence of immunity, females who are not pregnant should be vaccinated. If there is no evidence of immunity, females who are pregnant should delay immunization until after pregnancy.  Pneumococcal 13-valent conjugate (PCV13) vaccine.** / Consult your health care provider.  Pneumococcal polysaccharide (PPSV23) vaccine.** / 1 to 2 doses if you smoke cigarettes or if you have certain conditions.  Meningococcal vaccine.** /  Consult your health care provider.  Hepatitis A vaccine.** / Consult your health care provider.  Hepatitis B vaccine.** / Consult your health care provider.  Haemophilus influenzae type b (Hib) vaccine.** / Consult your health care provider. Ages 64 years and over  Blood pressure check.** / Every year.  Lipid and cholesterol check.** / Every 5 years beginning at age 23 years.  Lung cancer screening. / Every year if you  are aged 16-80 years and have a 30-pack-year history of smoking and currently smoke or have quit within the past 15 years. Yearly screening is stopped once you have quit smoking for at least 15 years or develop a health problem that would prevent you from having lung cancer treatment.  Clinical breast exam.** / Every year after age 74 years.  BRCA-related cancer risk assessment.** / For women who have family members with a BRCA-related cancer (breast, ovarian, tubal, or peritoneal cancers).  Mammogram.** / Every year beginning at age 44 years and continuing for as long as you are in good health. Consult with your health care provider.  Pap test.** / Every 3 years starting at age 58 years through age 22 or 39 years with 3 consecutive normal Pap tests. Testing can be stopped between 65 and 70 years with 3 consecutive normal Pap tests and no abnormal Pap or HPV tests in the past 10 years.  HPV screening.** / Every 3 years from ages 64 years through ages 70 or 61 years with a history of 3 consecutive normal Pap tests. Testing can be stopped between 65 and 70 years with 3 consecutive normal Pap tests and no abnormal Pap or HPV tests in the past 10 years.  Fecal occult blood test (FOBT) of stool. / Every year beginning at age 40 years and continuing until age 27 years. You may not need to do this test if you get a colonoscopy every 10 years.  Flexible sigmoidoscopy or colonoscopy.** / Every 5 years for a flexible sigmoidoscopy or every 10 years for a colonoscopy beginning at age 7 years and continuing until age 32 years.  Hepatitis C blood test.** / For all people born from [birth year] through 1965 and any individual with known risks for hepatitis C.  Osteoporosis screening.** / A one-time screening for women ages 30 years and over and women at risk for fractures or osteoporosis.  Skin self-exam. / Monthly.  Influenza vaccine. / Every year.  Tetanus, diphtheria, and acellular pertussis (Tdap/Td)  vaccine.** / 1 dose of Td every 10 years.  Varicella vaccine.** / Consult your health care provider.  Zoster vaccine.** / 1 dose for adults aged 35 years or older.  Pneumococcal 13-valent conjugate (PCV13) vaccine.** / Consult your health care provider.  Pneumococcal polysaccharide (PPSV23) vaccine.** / 1 dose for all adults aged 46 years and older.  Meningococcal vaccine.** / Consult your health care provider.  Hepatitis A vaccine.** / Consult your health care provider.  Hepatitis B vaccine.** / Consult your health care provider.  Haemophilus influenzae type b (Hib) vaccine.** / Consult your health care provider. ** Family history and personal history of risk and conditions may change your health care provider's recommendations.   This information is not intended to replace advice given to you by your health care provider. Make sure you discuss any questions you have with your health care provider.   Document Released: 09/07/2001 Document Revised: 08/02/2014 Document Reviewed: 12/07/2010 Elsevier Interactive Patient Education [employer].

## 2015-06-17 NOTE — Progress Notes (Signed)
Pre visit review using our clinic review tool, if applicable. No additional management support is needed unless otherwise documented below in the visit note. 

## 2015-06-17 NOTE — Progress Notes (Signed)
Subjective:  Patient ID: Meghan Park, female    DOB: 1971-07-04  Age: 44 y.o. MRN: [id number]  CC: Cough; Urinary Tract Infection; Annual Exam; and Diabetes   HPI [name] presents for a CPX - she complains of cough for 2 weeks with shortness of breath, palpitations (elevated heart rate), wheezing, and shortness of breath. The cough is productive of clear to white phlegm. She has chronic nausea, diffuse abdominal pain and intermittent diarrhea related to diabetic gastroparesis. 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 Her previous gastroenterologist has retired and she needs a referral to a new one. She also complains of persistent dysuria. A week ago she had a urine culture that was positive for enterococcus and she has taken a course of ampicillin. She complains of feeling puffy in her hands and feet.  Outpatient Prescriptions Prior to Visit  Medication Sig Dispense Refill  . AMBULATORY NON FORMULARY MEDICATION Medication Name: Domperidone 10 mg  Sig: 1 tablet by mouth daily before meals and once at bedtime for gastroparesis 120 tablet 5  . ampicillin (PRINCIPEN) 500 MG capsule Take 1 capsule (500 mg total) by mouth 3 (three) times daily. 21 capsule 1  . clonazePAM (KLONOPIN) 1 MG tablet TAKE 1/2 TO 1 TABLET BY MOUTH EVERY DAY AS NEEDED FOR ANXIETY 30 tablet 3  . cyclobenzaprine (FLEXERIL) 10 MG tablet TAKE 1 TABLET BY MOUTH AT BEDTIME AS NEEDED FOR MUSCLE SPASMS 30 tablet 2  . demeclocycline (DECLOMYCIN) 150 MG tablet Take 1 tablet (150 mg total) by mouth daily. 30 tablet 11  . hyoscyamine (LEVBID) 0.375 MG 12 hr tablet Take 1 tablet (0.375 mg total) by mouth every 12 (twelve) hours as needed. 60 tablet 11  . mirtazapine (REMERON SOL-TAB) 15 MG disintegrating tablet Take 1 tablet (15 mg total) by mouth at bedtime. 30 tablet 1  . ondansetron (ZOFRAN) 4 MG tablet Take 1 tablet (4 mg total) by mouth 2 (two) times daily. 60 tablet 5  . sertraline (ZOLOFT)  100 MG tablet Take 1.5 tablets (150 mg total) by mouth daily. TAKE 1 TABLET(100 MG) BY MOUTH DAILY 45 tablet 1  . Vitamin D, Ergocalciferol, (DRISDOL) 50000 UNITS CAPS capsule Take 1 capsule (50,000 Units total) by mouth 3 (three) times a week. 12 capsule 0  . gabapentin (NEURONTIN) 100 MG capsule      No facility-administered medications prior to visit.    ROS Review of Systems  Constitutional: Positive for fatigue and unexpected weight change (wt gain). Negative for fever, chills, diaphoresis, activity change and appetite change.  HENT: Negative.   Eyes: Negative.   Respiratory: Positive for cough, shortness of breath and wheezing. Negative for apnea, choking, chest tightness and stridor.   Cardiovascular: Negative.  Negative for chest pain, palpitations and leg swelling.  Gastrointestinal: Positive for nausea, abdominal pain and diarrhea. Negative for vomiting, constipation, blood in stool, abdominal distention, anal bleeding and rectal pain.  Endocrine: Negative.  Negative for polydipsia, polyphagia and polyuria.  Genitourinary: Positive for dysuria. Negative for urgency, frequency, hematuria, vaginal bleeding, vaginal discharge, difficulty urinating, vaginal pain and dyspareunia.  Musculoskeletal: Negative.  Negative for myalgias, back pain, joint swelling, arthralgias and neck pain.  Skin: Negative.  Negative for pallor and rash.  Allergic/Immunologic: Negative.   Neurological: Negative.   Hematological: Negative.  Negative for adenopathy. Does not bruise/bleed easily.  Psychiatric/Behavioral: Positive for sleep disturbance and dysphoric mood. Negative for suicidal ideas, hallucinations, behavioral problems, confusion, self-injury, decreased concentration and agitation. The  patient is nervous/anxious. The patient is not hyperactive.     Objective:  BP 108/78 mmHg  Pulse 119  Temp(Src) 98.3 F (36.8 C) (Oral)  Resp 16  Ht  (1.6 m)  Wt 200 lb (90.719 kg)  BMI 35.44 kg/m2   SpO2 95%  BP Readings from Last 3 Encounters:  06/17/15 108/78  05/15/15 110/88  05/13/15 112/82    Wt Readings from Last 3 Encounters:  06/17/15 200 lb (90.719 kg)  05/15/15 194 lb (87.998 kg)  05/13/15 194 lb 6.4 oz (88.179 kg)    Physical Exam  Constitutional: She is oriented to person, place, and time. She appears well-developed and well-nourished.  Non-toxic appearance. She does not have a sickly appearance. She does not appear ill. No distress.  HENT:  Head: Normocephalic and atraumatic.  Mouth/Throat: Oropharynx is clear and moist. No oropharyngeal exudate.  Eyes: Conjunctivae are normal. Right eye exhibits no discharge. Left eye exhibits no discharge. No scleral icterus.  Neck: Normal range of motion. Neck supple. No JVD present. No tracheal deviation present. No thyromegaly present.  Cardiovascular: Normal rate, regular rhythm, normal heart sounds and intact distal pulses.  Exam reveals no gallop and no friction rub.   No murmur heard. Pulses:      Carotid pulses are 1+ on the right side, and 1+ on the left side.      Radial pulses are 1+ on the right side, and 1+ on the left side.       Femoral pulses are 1+ on the right side, and 1+ on the left side.      Popliteal pulses are 1+ on the right side, and 1+ on the left side.       Dorsalis pedis pulses are 1+ on the right side, and 1+ on the left side.       Posterior tibial pulses are 1+ on the right side, and 1+ on the left side.  EKG - sinus tach, low voltage c/w body habitus, no acute changes noted  Pulmonary/Chest: Effort normal and breath sounds normal. No stridor. No respiratory distress. She has no wheezes. She has no rales. She exhibits no tenderness.  Abdominal: Soft. Bowel sounds are normal. She exhibits no distension and no mass. There is no tenderness. There is no rebound and no guarding.  Musculoskeletal: Normal range of motion. She exhibits no edema or tenderness.  Lymphadenopathy:    She has no cervical  adenopathy.  Neurological: She is oriented to person, place, and time.  Skin: Skin is warm and dry. No rash noted. She is not diaphoretic. No erythema. No pallor.  Vitals reviewed.   Lab Results  Component Value Date   WBC 9.6 04/02/2015   HGB 13.3 04/02/2015   HCT 38.8 04/02/2015   PLT 196.0 04/02/2015   GLUCOSE 287* 06/17/2015   CHOL 274* 06/17/2015   TRIG * 06/17/2015    433.0 Triglyceride is over 400; calculations on Lipids are invalid.   HDL 40.40 06/17/2015   LDLDIRECT 168.0 06/17/2015   LDLCALC 96 04/15/2014   ALT 11 11/21/2014   AST 14 11/21/2014   NA 131* 06/17/2015   K 3.8 06/17/2015   CL 95* 06/17/2015   CREATININE 0.75 06/17/2015   BUN 10 06/17/2015   CO2 22 06/17/2015   TSH 1.33 06/17/2015   INR 1.18 04/22/2012   HGBA1C 11.0* 06/17/2015   MICROALBUR 0.5 07/17/2014    Nm Gastric Emptying  01/22/2015  CLINICAL DATA:  Abdominal pain, bloating, nausea and vomiting, history  of diabetes. EXAM: NUCLEAR MEDICINE GASTRIC EMPTYING SCAN TECHNIQUE: After oral ingestion of radiolabeled meal, sequential abdominal images were obtained for 4 hours. Percentage of activity emptying the stomach was calculated at 1 hour, 2 hour, 3 hour, and 4 hours. RADIOPHARMACEUTICALS:  2.2 mCi Tc-59m MDP labeled sulfur colloid orally and angle with toast and 4 oz of water COMPARISON:  Gastric emptying study of September 13, 2013 FINDINGS: Expected location of the stomach in the left upper quadrant. Ingested meal empties the stomach gradually over the course of the study. 29% emptied at 1 hr ( normal >= 10%) 61% emptied at 2 hr ( normal >= 40%) 64% emptied at 3 hr ( normal >= 70%) 66% emptied at 4 hr ( normal >= 90%) IMPRESSION: Delayed gastric emptying study. Electronically Signed   By: David  [place] M.D.   On: 01/22/2015 11:49    Assessment & Plan:   Killian was seen today for cough, urinary tract infection, annual exam and diabetes.  Diagnoses and all orders for this visit:  UTI (urinary tract  infection) due to Enterococcus -     CULTURE, URINE COMPREHENSIVE; Future -     fluconazole (DIFLUCAN) 200 MG tablet; Take 1 tablet (200 mg total) by mouth daily.  Routine general medical examination at a health care facility- exam done, vaccines reviewed and updated. Mammo and PAP are UTD, labs ordered and reviewed, pt ed material was given -     Lipid panel; Future -     TSH; Future -     Basic metabolic panel; Future -     Urinalysis, Routine w reflex microscopic (not at [facility]); Future  Hyperglycemia -     Hemoglobin A1c; Future  Diabetic neuropathy, painful (HCC) -     gabapentin (NEURONTIN) 100 MG capsule; Take 1 capsule (100 mg total) by mouth 3 (three) times daily.  Obstructive chronic bronchitis without exacerbation (HCC)- she will start using Stiolto for this, I gave her a sample and showed her how to use it -     Tiotropium Bromide-Olodaterol (STIOLTO RESPIMAT) 2.5-2.5 MCG/ACT AERS; Inhale 2 puffs into the lungs daily.  Cough- her CXR is normal, will treat COPD -     DG Chest 2 View; Future  SOB (shortness of breath)- EKG is normal, there is no edema on exam, will treat her COPD -     EKG 12-Lead  Type 2 diabetes mellitus with complication, with long-term current use of insulin (HCC)- her A1C is up to 11%, I have asked her return urgently to start insulin therapy and oral agents, will also get her back in to see ENDO  -     Ambulatory referral to Endocrinology  Irritable bowel syndrome with diarrhea -     Ambulatory referral to Gastroenterology  Gastroparesis -     Ambulatory referral to Gastroenterology   I have changed Ms. Eckersley's gabapentin. I am also having her start on Tiotropium Bromide-Olodaterol and fluconazole. Additionally, I am having her maintain her ondansetron, clonazePAM, demeclocycline, AMBULATORY NON FORMULARY MEDICATION, mirtazapine, sertraline, Vitamin D (Ergocalciferol), cyclobenzaprine, hyoscyamine, ampicillin, and Cholecalciferol.  Meds ordered this  encounter  Medications  . gabapentin (NEURONTIN) 100 MG capsule    Sig: Take 1 capsule (100 mg total) by mouth 3 (three) times daily.    Dispense:  90 capsule    Refill:  11  . Cholecalciferol (VITAMIN D-1000 MAX ST) 1000 UNITS tablet    Sig: Take 1,000 Units by mouth.  . Tiotropium Bromide-Olodaterol (STIOLTO RESPIMAT) 2.5-2.5 MCG/ACT AERS  Sig: Inhale 2 puffs into the lungs daily.    Dispense:  4 g    Refill:  11  . fluconazole (DIFLUCAN) 200 MG tablet    Sig: Take 1 tablet (200 mg total) by mouth daily.    Dispense:  7 tablet    Refill:  1     Follow-up: Return in about 3 months (around 09/17/2015).  Sanda Linger, MD

## 2015-06-18 ENCOUNTER — Encounter: Payer: Self-pay | Admitting: Internal Medicine

## 2015-06-18 MED ORDER — FLUCONAZOLE 200 MG PO TABS: 200.0000 mg | ORAL_TABLET | Freq: Every day | ORAL | Status: DC

## 2015-06-20 ENCOUNTER — Encounter: Payer: Self-pay | Admitting: Internal Medicine

## 2015-06-20 LAB — CULTURE, URINE COMPREHENSIVE: Colony Count: 50000

## 2015-06-22 ENCOUNTER — Encounter: Payer: Self-pay | Admitting: Internal Medicine

## 2015-06-23 ENCOUNTER — Other Ambulatory Visit: Payer: Self-pay | Admitting: Internal Medicine

## 2015-06-23 DIAGNOSIS — N3001 Acute cystitis with hematuria: Secondary | ICD-10-CM

## 2015-06-23 MED ORDER — SULFAMETHOXAZOLE-TRIMETHOPRIM 800-160 MG PO TABS: 1.0000 | ORAL_TABLET | Freq: Two times a day (BID) | ORAL | Status: AC

## 2015-06-30 ENCOUNTER — Other Ambulatory Visit (HOSPITAL_COMMUNITY): Payer: Self-pay | Admitting: Psychiatry

## 2015-06-30 ENCOUNTER — Encounter: Payer: Self-pay | Admitting: Internal Medicine

## 2015-06-30 ENCOUNTER — Ambulatory Visit (INDEPENDENT_AMBULATORY_CARE_PROVIDER_SITE_OTHER): Payer: Managed Care, Other (non HMO) | Admitting: Internal Medicine

## 2015-06-30 VITALS — BP 136/82 | HR 80 | Temp 98.6°F | Resp 20 | Ht 62.0 in | Wt 200.8 lb

## 2015-06-30 DIAGNOSIS — I10 Essential (primary) hypertension: Secondary | ICD-10-CM

## 2015-06-30 DIAGNOSIS — M150 Primary generalized (osteo)arthritis: Secondary | ICD-10-CM | POA: Insufficient documentation

## 2015-06-30 DIAGNOSIS — M159 Polyosteoarthritis, unspecified: Secondary | ICD-10-CM

## 2015-06-30 DIAGNOSIS — E1140 Type 2 diabetes mellitus with diabetic neuropathy, unspecified: Secondary | ICD-10-CM

## 2015-06-30 DIAGNOSIS — E118 Type 2 diabetes mellitus with unspecified complications: Secondary | ICD-10-CM | POA: Diagnosis not present

## 2015-06-30 DIAGNOSIS — K3184 Gastroparesis: Secondary | ICD-10-CM

## 2015-06-30 DIAGNOSIS — Z794 Long term (current) use of insulin: Secondary | ICD-10-CM

## 2015-06-30 MED ORDER — TRAMADOL HCL 50 MG PO TABS: 50.0000 mg | ORAL_TABLET | Freq: Four times a day (QID) | ORAL | Status: DC | PRN

## 2015-06-30 MED ORDER — INSULIN PEN NEEDLE 32G X 6 MM MISC: 1.0000 | Freq: Every day | Status: DC

## 2015-06-30 MED ORDER — PIOGLITAZONE HCL 15 MG PO TABS: 15.0000 mg | ORAL_TABLET | Freq: Every day | ORAL | Status: DC

## 2015-06-30 MED ORDER — GLUCOSE BLOOD VI STRP: ORAL_STRIP | Status: AC

## 2015-06-30 MED ORDER — INSULIN GLARGINE 300 UNIT/ML ~~LOC~~ SOPN: 40.0000 [IU] | PEN_INJECTOR | Freq: Every day | SUBCUTANEOUS | Status: DC

## 2015-06-30 MED ORDER — ONETOUCH VERIO W/DEVICE KIT: 1.0000 | PACK | Freq: Three times a day (TID) | Status: AC

## 2015-06-30 NOTE — Progress Notes (Signed)
Pre visit review using our clinic review tool, if applicable. No additional management support is needed unless otherwise documented below in the visit note. 

## 2015-06-30 NOTE — Patient Instructions (Signed)

## 2015-07-01 NOTE — Telephone Encounter (Signed)
appt on 07/15/2015

## 2015-07-01 NOTE — Assessment & Plan Note (Signed)
She has a f/up appt with GI soon

## 2015-07-01 NOTE — Progress Notes (Signed)
Subjective:  Patient ID: Meghan Park, female    DOB: 10-Feb-1971  Age: 44 y.o. MRN: [id number]  CC: Diabetes and Osteoarthritis   HPI Meghan Park presents for f/up on DM2, arthralgias/myalgias, and recurrent UTI associated with glucosuria.   Outpatient Prescriptions Prior to Visit  Medication Sig Dispense Refill  . AMBULATORY NON FORMULARY MEDICATION Medication Name: Domperidone 10 mg  Sig: 1 tablet by mouth daily before meals and once at bedtime for gastroparesis 120 tablet 5  . Cholecalciferol (VITAMIN D-1000 MAX ST) 1000 UNITS tablet Take 1,000 Units by mouth.    . clonazePAM (KLONOPIN) 1 MG tablet TAKE 1/2 TO 1 TABLET BY MOUTH EVERY DAY AS NEEDED FOR ANXIETY 30 tablet 3  . cyclobenzaprine (FLEXERIL) 10 MG tablet TAKE 1 TABLET BY MOUTH AT BEDTIME AS NEEDED FOR MUSCLE SPASMS 30 tablet 2  . demeclocycline (DECLOMYCIN) 150 MG tablet Take 1 tablet (150 mg total) by mouth daily. 30 tablet 11  . gabapentin (NEURONTIN) 100 MG capsule Take 1 capsule (100 mg total) by mouth 3 (three) times daily. 90 capsule 11  . hyoscyamine (LEVBID) 0.375 MG 12 hr tablet Take 1 tablet (0.375 mg total) by mouth every 12 (twelve) hours as needed. 60 tablet 11  . mirtazapine (REMERON SOL-TAB) 15 MG disintegrating tablet Take 1 tablet (15 mg total) by mouth at bedtime. 30 tablet 1  . ondansetron (ZOFRAN) 4 MG tablet Take 1 tablet (4 mg total) by mouth 2 (two) times daily. 60 tablet 5  . sertraline (ZOLOFT) 100 MG tablet Take 1.5 tablets (150 mg total) by mouth daily. TAKE 1 TABLET(100 MG) BY MOUTH DAILY 45 tablet 1  . sulfamethoxazole-trimethoprim (BACTRIM DS,SEPTRA DS) 800-160 MG tablet Take 1 tablet by mouth 2 (two) times daily. 14 tablet 1  . Tiotropium Bromide-Olodaterol (STIOLTO RESPIMAT) 2.5-2.5 MCG/ACT AERS Inhale 2 puffs into the lungs daily. 4 g 11  . Vitamin D, Ergocalciferol, (DRISDOL) 50000 UNITS CAPS capsule Take 1 capsule (50,000 Units total) by mouth 3 (three) times a week. 12 capsule 0  .  fluconazole (DIFLUCAN) 200 MG tablet Take 1 tablet (200 mg total) by mouth daily. 7 tablet 1   No facility-administered medications prior to visit.    ROS Review of Systems  Constitutional: Positive for unexpected weight change (wt gain). Negative for fever, chills, diaphoresis, appetite change and fatigue.  HENT: Negative.   Eyes: Negative.   Respiratory: Negative.  Negative for cough, choking, chest tightness, shortness of breath and stridor.   Cardiovascular: Negative.  Negative for chest pain, palpitations and leg swelling.  Gastrointestinal: Positive for nausea, vomiting, abdominal pain and diarrhea. Negative for constipation, blood in stool, abdominal distention, anal bleeding and rectal pain.       She has chronic GI sx's related to diabetic gastroparesis  Endocrine: Positive for polydipsia, polyphagia and polyuria.  Genitourinary: Negative.  Negative for dysuria, urgency, frequency, hematuria, enuresis and difficulty urinating.  Musculoskeletal: Positive for myalgias and arthralgias. Negative for back pain, joint swelling and gait problem.  Skin: Negative.  Negative for rash.  Allergic/Immunologic: Negative.   Neurological: Negative.  Negative for dizziness, tremors, weakness, light-headedness and numbness.       Burning foot pain  Hematological: Negative.  Negative for adenopathy. Does not bruise/bleed easily.  Psychiatric/Behavioral: Positive for sleep disturbance and dysphoric mood. Negative for suicidal ideas, hallucinations, behavioral problems, confusion, self-injury, decreased concentration and agitation. The patient is nervous/anxious. The patient is not hyperactive.     Objective:  BP 136/82 mmHg  Pulse 80  Temp(Src) 98.6 F (37 C) (Oral)  Resp 20  Ht _0  (1.575 m)  Wt 200 lb 12 oz (91.06 kg)  BMI 36.71 kg/m2  SpO2 99%  BP Readings from Last 3 Encounters:  06/30/15 136/82  06/17/15 108/78  05/15/15 110/88    Wt Readings from Last 3 Encounters:  06/30/15  200 lb 12 oz (91.06 kg)  06/17/15 200 lb (90.719 kg)  05/15/15 194 lb (87.998 kg)    Physical Exam  Constitutional: She is oriented to person, place, and time. She appears well-developed and well-nourished. No distress.  HENT:  Head: Normocephalic and atraumatic.  Mouth/Throat: Oropharynx is clear and moist. No oropharyngeal exudate.  Eyes: Conjunctivae are normal. Right eye exhibits no discharge. Left eye exhibits no discharge. No scleral icterus.  Neck: Normal range of motion. Neck supple. No JVD present. No tracheal deviation present. No thyromegaly present.  Cardiovascular: Normal rate, regular rhythm, normal heart sounds and intact distal pulses.  Exam reveals no gallop and no friction rub.   No murmur heard. Pulmonary/Chest: Effort normal and breath sounds normal. No stridor. No respiratory distress. She has no wheezes. She has no rales. She exhibits no tenderness.  Abdominal: Soft. Bowel sounds are normal. She exhibits no distension and no mass. There is no tenderness. There is no rebound and no guarding.  Musculoskeletal: Normal range of motion. She exhibits no edema or tenderness.  Lymphadenopathy:    She has no cervical adenopathy.  Neurological: She is oriented to person, place, and time.  Skin: Skin is warm and dry. No rash noted. She is not diaphoretic. No erythema. No pallor.  Psychiatric: Her behavior is normal. Judgment normal. Her mood appears anxious. Her affect is not angry, not blunt, not labile and not inappropriate. Her speech is not rapid and/or pressured, not delayed and not tangential. Cognition and memory are normal. She exhibits a depressed mood. She expresses no homicidal and no suicidal ideation. She expresses no suicidal plans and no homicidal plans.  Vitals reviewed.   Lab Results  Component Value Date   WBC 9.6 04/02/2015   HGB 13.3 04/02/2015   HCT 38.8 04/02/2015   PLT 196.0 04/02/2015   GLUCOSE 287* 06/17/2015   CHOL 274* 06/17/2015   TRIG *  06/17/2015    433.0 Triglyceride is over 400; calculations on Lipids are invalid.   HDL 40.40 06/17/2015   LDLDIRECT 168.0 06/17/2015   LDLCALC 96 04/15/2014   ALT 11 11/21/2014   AST 14 11/21/2014   NA 131* 06/17/2015   K 3.8 06/17/2015   CL 95* 06/17/2015   CREATININE 0.75 06/17/2015   BUN 10 06/17/2015   CO2 22 06/17/2015   TSH 1.33 06/17/2015   INR 1.18 04/22/2012   HGBA1C 11.0* 06/17/2015   MICROALBUR 0.5 07/17/2014    Dg Chest 2 View  06/17/2015  CLINICAL DATA:  Cough for 3 weeks EXAM: CHEST  2 VIEW COMPARISON:  05/26/2012 FINDINGS: Normal heart size. Clear lungs. No pleural effusion or pneumothorax. IMPRESSION: No active cardiopulmonary disease. Electronically Signed   By: Marybelle Killings M.D.   On: 06/17/2015 16:03    Assessment & Plan:   Meghan Park was seen today for diabetes and osteoarthritis.  Diagnoses and all orders for this visit:  Type 2 diabetes mellitus with complication, with long-term current use of insulin ([place])- her A1c is up to 11% and she has hyperglycemia symptoms. Her urinalysis shows glucosuria. She has a history of side effects from metformin and Januvia so she will not consider any  medications and those categories. Since she has GI symptoms at all think she is a good candidate for a GLP-1 agonist either. Meghan Park has glucosuria and a frequent urinary tract infection so she is contraindicated from taking S TLT 2 inhibitor. Will start on a basal insulin with Toujeo 40 units once a day and will start Actos at a low dose. She also has a follow-up within the next month with endocrinology for further evaluation -     Insulin Glargine (TOUJEO SOLOSTAR) 300 UNIT/ML SOPN; Inject 40 Units into the skin daily. -     Insulin Pen Needle (NOVOFINE) 32G X 6 MM MISC; 1 Act by Does not apply route daily. -     pioglitazone (ACTOS) 15 MG tablet; Take 1 tablet (15 mg total) by mouth daily. -     glucose blood test strip; Use TID -     Blood Glucose Monitoring Suppl (ONETOUCH VERIO)  W/DEVICE KIT; 1 Act by Does not apply route 3 (three) times daily.  Diabetic neuropathy, painful (HCC) -     traMADol (ULTRAM) 50 MG tablet; Take 1 tablet (50 mg total) by mouth every 6 (six) hours as needed. -     Blood Glucose Monitoring Suppl (ONETOUCH VERIO) W/DEVICE KIT; 1 Act by Does not apply route 3 (three) times daily.  Primary osteoarthritis involving multiple joints -     traMADol (ULTRAM) 50 MG tablet; Take 1 tablet (50 mg total) by mouth every 6 (six) hours as needed.   I have discontinued Ms. Buser's fluconazole. I am also having her start on Insulin Glargine, Insulin Pen Needle, traMADol, pioglitazone, glucose blood, and ONETOUCH VERIO. Additionally, I am having her maintain her ondansetron, clonazePAM, demeclocycline, AMBULATORY NON FORMULARY MEDICATION, mirtazapine, sertraline, Vitamin D (Ergocalciferol), cyclobenzaprine, hyoscyamine, gabapentin, Cholecalciferol, and Tiotropium Bromide-Olodaterol.  Meds ordered this encounter  Medications  . Insulin Glargine (TOUJEO SOLOSTAR) 300 UNIT/ML SOPN    Sig: Inject 40 Units into the skin daily.    Dispense:  1.5 mL    Refill:  11  . Insulin Pen Needle (NOVOFINE) 32G X 6 MM MISC    Sig: 1 Act by Does not apply route daily.    Dispense:  100 each    Refill:  3  . traMADol (ULTRAM) 50 MG tablet    Sig: Take 1 tablet (50 mg total) by mouth every 6 (six) hours as needed.    Dispense:  75 tablet    Refill:  5  . pioglitazone (ACTOS) 15 MG tablet    Sig: Take 1 tablet (15 mg total) by mouth daily.    Dispense:  90 tablet    Refill:  1  . glucose blood test strip    Sig: Use TID    Dispense:  100 each    Refill:  12  . Blood Glucose Monitoring Suppl (ONETOUCH VERIO) W/DEVICE KIT    Sig: 1 Act by Does not apply route 3 (three) times daily.    Dispense:  2 kit    Refill:  0     Follow-up: Return in about 3 months (around 09/28/2015).  Scarlette Calico, MD

## 2015-07-01 NOTE — Assessment & Plan Note (Signed)
Her BP is well controlled 

## 2015-07-02 ENCOUNTER — Telehealth: Payer: Self-pay | Admitting: Internal Medicine

## 2015-07-02 ENCOUNTER — Encounter: Payer: Self-pay | Admitting: Internal Medicine

## 2015-07-02 NOTE — Telephone Encounter (Signed)
Pt has sent an email through [facility] and would like to be sure it gets read

## 2015-07-03 ENCOUNTER — Encounter: Payer: Self-pay | Admitting: Endocrinology

## 2015-07-04 ENCOUNTER — Telehealth: Payer: Self-pay

## 2015-07-04 MED ORDER — NITROFURANTOIN MONOHYD MACRO 100 MG PO CAPS: 100.0000 mg | ORAL_CAPSULE | Freq: Two times a day (BID) | ORAL | Status: DC

## 2015-07-04 MED ORDER — FLUCONAZOLE 150 MG PO TABS: 150.0000 mg | ORAL_TABLET | Freq: Once | ORAL | Status: DC

## 2015-07-04 NOTE — Telephone Encounter (Signed)
Macrobid and diflucan will be sent. Please have her follow up with urology.

## 2015-07-04 NOTE — Telephone Encounter (Signed)
Pt was in on 11/22 for UTI. Was sent in Antiobiotics and Dicflucan for yeast infection; was done with course on Monday and now c/o of symptoms remaining the same. PT has a hx of recurrent UTI's please advise. Culture was sent on 11/22. Can another course be sent to pharmacy? Dr Yetta BarreJones out of office

## 2015-07-04 NOTE — Telephone Encounter (Signed)
Pt informed via mychart

## 2015-07-10 ENCOUNTER — Ambulatory Visit (INDEPENDENT_AMBULATORY_CARE_PROVIDER_SITE_OTHER): Payer: Managed Care, Other (non HMO) | Admitting: Gastroenterology

## 2015-07-10 ENCOUNTER — Encounter: Payer: Self-pay | Admitting: Gastroenterology

## 2015-07-10 VITALS — BP 120/86 | HR 112 | Ht 62.0 in | Wt 202.0 lb

## 2015-07-10 DIAGNOSIS — K580 Irritable bowel syndrome with diarrhea: Secondary | ICD-10-CM | POA: Diagnosis not present

## 2015-07-10 DIAGNOSIS — E1143 Type 2 diabetes mellitus with diabetic autonomic (poly)neuropathy: Secondary | ICD-10-CM | POA: Diagnosis not present

## 2015-07-10 DIAGNOSIS — K3184 Gastroparesis: Secondary | ICD-10-CM

## 2015-07-10 NOTE — Patient Instructions (Signed)
We have made you an appointment to Dr. Alycia RossettiKoch on 11/19/15 3:00 pm. Go to the [address] location, please bring your co-pay, photo id and insurance card. They will send you an appointment reminder in the mail. You also will be placed on a wait list for cancellations.

## 2015-07-10 NOTE — Progress Notes (Signed)
     07/10/2015 Meghan Park L Smylie [id number] 10-05-1970   History of Present Illness:  This is a 44 year old female who is previously known to Dr. Arlyce DiceKaplan.  Her care will be assumed by Dr. Adela LankArmbruster.  Has gastroparesis and IBS-D.  She was last seen by me in August.  Her gastroparesis is secondary to DM and her blood sugars have not been very well controlled recently. She is seeing an endocrinologist. She tells me that she has been following the gastroparesis diet. She was on Reglan in the past, but apparently had reactions to it. At her last visit we discussed domperidone, but she decided that she did not want to try it because she did not like the side effects. [facility] Logan BoresEvans , but did not care for him and says that he just recommended that she follow a gastroparesis diet. We would prefer her to see Dr. Alycia RossettiKoch since he is motility/gastroparesis specialist.  We do not have much else to offer her from our standpoint.   She was previously taking Phenergan as needed, by her PCP recently switched that to Zofran. She says that sometimes it helps and sometimes doesn't. She continues to complain of upper abdominal discomfort with nausea and belching that is very foul-smelling.  She continues with loose stools a few times daily but is currently taking HyoMax 0.375 mg twice daily for her symptoms and that seems to be keeping her at a baseline.    She also tells me about a sensation on the right side of her abdomen that feels like a "charley horse " occurring a couple times a week and only lasting a couple of minutes at a time. She does not have a gallbladder.  Does not seem to be triggered by anything.  She has had extensive GI evaluation in 2016 with the exception of a CT scan.   Current Medications, Allergies, Past Medical History, Past Surgical History, Family History and Social History were reviewed in [facility] electronic medical record.   Physical  Exam: BP 120/86 mmHg  Pulse 112  Ht 5\' 2"  (1.575 m)  Wt 202 lb (91.627 kg)  BMI 36.94 kg/m2 General: Well developed white female in no acute distress Head: Normocephalic and atraumatic Eyes:  Sclerae anicteric, conjunctiva pink  Ears: Normal auditory acuity Lungs: Clear throughout to auscultation Heart: Tachy.  No murmurs Abdomen: Soft, non-distended.  Normal bowel sounds.  Mild diffuse TTP. Musculoskeletal: Symmetrical with no gross deformities  Extremities: No edema  Neurological: Alert oriented x 4, grossly non-focal Psychological:  Alert and cooperative. Normal mood and affect  Assessment and Recommendations: -Gastroparesis:  Had side effects with Reglan in the past.  Did not want to try domperidone that I recommended at her last visit.  Apparently is following dietary measures, but BS have not been controlled well recently and once again, we stressed the need for that.  Will refer to [facility] for any further recommendations. -IBS with diarrhea and abdominal pain:  Symptoms overall controlled on Hyomax BID.  Will continue.  Discussed that diabetes can contribute to/cause diarrhea as well.

## 2015-07-11 NOTE — Progress Notes (Signed)
Agree with assessment and plan as outlined. Would recommend trial of domperidone if symptoms severe, however given she declined she can discuss alternatives with motility specialist

## 2015-07-13 ENCOUNTER — Encounter: Payer: Self-pay | Admitting: Internal Medicine

## 2015-07-14 ENCOUNTER — Other Ambulatory Visit: Payer: Self-pay | Admitting: Internal Medicine

## 2015-07-14 DIAGNOSIS — N390 Urinary tract infection, site not specified: Secondary | ICD-10-CM

## 2015-07-14 DIAGNOSIS — R310 Gross hematuria: Secondary | ICD-10-CM

## 2015-07-14 DIAGNOSIS — R319 Hematuria, unspecified: Secondary | ICD-10-CM

## 2015-07-14 DIAGNOSIS — Z0000 Encounter for general adult medical examination without abnormal findings: Secondary | ICD-10-CM

## 2015-07-15 ENCOUNTER — Ambulatory Visit (INDEPENDENT_AMBULATORY_CARE_PROVIDER_SITE_OTHER): Payer: Managed Care, Other (non HMO) | Admitting: Psychiatry

## 2015-07-15 ENCOUNTER — Telehealth (HOSPITAL_COMMUNITY): Payer: Self-pay

## 2015-07-15 ENCOUNTER — Encounter (HOSPITAL_COMMUNITY): Payer: Self-pay | Admitting: Psychiatry

## 2015-07-15 ENCOUNTER — Other Ambulatory Visit (INDEPENDENT_AMBULATORY_CARE_PROVIDER_SITE_OTHER): Payer: Managed Care, Other (non HMO)

## 2015-07-15 VITALS — BP 129/90 | HR 84 | Ht 62.0 in | Wt 202.0 lb

## 2015-07-15 DIAGNOSIS — R319 Hematuria, unspecified: Secondary | ICD-10-CM

## 2015-07-15 DIAGNOSIS — F331 Major depressive disorder, recurrent, moderate: Secondary | ICD-10-CM | POA: Diagnosis not present

## 2015-07-15 DIAGNOSIS — F4323 Adjustment disorder with mixed anxiety and depressed mood: Secondary | ICD-10-CM

## 2015-07-15 DIAGNOSIS — N390 Urinary tract infection, site not specified: Secondary | ICD-10-CM | POA: Diagnosis not present

## 2015-07-15 DIAGNOSIS — F411 Generalized anxiety disorder: Secondary | ICD-10-CM

## 2015-07-15 DIAGNOSIS — G4700 Insomnia, unspecified: Secondary | ICD-10-CM | POA: Insufficient documentation

## 2015-07-15 DIAGNOSIS — R310 Gross hematuria: Secondary | ICD-10-CM | POA: Diagnosis not present

## 2015-07-15 LAB — URINALYSIS, ROUTINE W REFLEX MICROSCOPIC
Bilirubin Urine: NEGATIVE
Hgb urine dipstick: NEGATIVE
KETONES UR: NEGATIVE
LEUKOCYTES UA: NEGATIVE
NITRITE: NEGATIVE
Total Protein, Urine: NEGATIVE
UROBILINOGEN UA: 0.2 (ref 0.0–1.0)
Urine Glucose: 100 — AB
pH: 6 (ref 5.0–8.0)

## 2015-07-15 MED ORDER — SERTRALINE HCL 100 MG PO TABS: 150.0000 mg | ORAL_TABLET | Freq: Every day | ORAL | Status: DC

## 2015-07-15 MED ORDER — MIRTAZAPINE 30 MG PO TBDP: 30.0000 mg | ORAL_TABLET | Freq: Every day | ORAL | Status: DC

## 2015-07-15 MED ORDER — CLONAZEPAM 1 MG PO TABS: ORAL_TABLET | ORAL | Status: DC

## 2015-07-15 NOTE — Telephone Encounter (Signed)
Telephone call with Murrell ReddenZhen, [facility] who wanted to verify patient's correct instructions for Zoloft.  Verified from Dr.Agarwal's notes this date patient is to be taking Zoloft 100mg , 1.5 tablets per day, #45.
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
## 2015-07-16 LAB — CULTURE, URINE COMPREHENSIVE
Colony Count: NO GROWTH
ORGANISM ID, BACTERIA: NO GROWTH

## 2015-07-17 ENCOUNTER — Encounter: Payer: Self-pay | Admitting: Internal Medicine

## 2015-07-21 ENCOUNTER — Encounter: Payer: Self-pay | Admitting: Internal Medicine

## 2015-07-22 ENCOUNTER — Encounter: Payer: Self-pay | Admitting: Internal Medicine

## 2015-07-23 ENCOUNTER — Other Ambulatory Visit: Payer: Self-pay

## 2015-07-23 DIAGNOSIS — J449 Chronic obstructive pulmonary disease, unspecified: Secondary | ICD-10-CM

## 2015-07-23 MED ORDER — TIOTROPIUM BROMIDE-OLODATEROL 2.5-2.5 MCG/ACT IN AERS: 2.0000 | INHALATION_SPRAY | Freq: Every day | RESPIRATORY_TRACT | Status: DC

## 2015-07-24 ENCOUNTER — Ambulatory Visit
Admission: RE | Admit: 2015-07-24 | Discharge: 2015-07-24 | Disposition: A | Discharge status: Home / Self Care | Payer: Managed Care, Other (non HMO) | Source: Ambulatory Visit | Attending: Internal Medicine | Admitting: Internal Medicine

## 2015-07-24 DIAGNOSIS — R310 Gross hematuria: Secondary | ICD-10-CM

## 2015-07-24 DIAGNOSIS — N390 Urinary tract infection, site not specified: Secondary | ICD-10-CM

## 2015-07-24 DIAGNOSIS — R319 Hematuria, unspecified: Secondary | ICD-10-CM

## 2015-07-28 ENCOUNTER — Encounter: Payer: Self-pay | Admitting: Internal Medicine

## 2015-08-04 ENCOUNTER — Ambulatory Visit: Payer: Self-pay | Admitting: Endocrinology

## 2015-08-07 ENCOUNTER — Ambulatory Visit (INDEPENDENT_AMBULATORY_CARE_PROVIDER_SITE_OTHER): Payer: Managed Care, Other (non HMO) | Admitting: Endocrinology

## 2015-08-07 ENCOUNTER — Encounter: Payer: Self-pay | Admitting: Endocrinology

## 2015-08-07 VITALS — BP 134/84 | HR 123 | Temp 98.5°F | Ht 62.0 in | Wt 208.0 lb

## 2015-08-07 DIAGNOSIS — Z794 Long term (current) use of insulin: Secondary | ICD-10-CM | POA: Diagnosis not present

## 2015-08-07 DIAGNOSIS — E118 Type 2 diabetes mellitus with unspecified complications: Secondary | ICD-10-CM

## 2015-08-07 LAB — POCT GLYCOSYLATED HEMOGLOBIN (HGB A1C): HEMOGLOBIN A1C: 10.3

## 2015-08-07 MED ORDER — INSULIN GLARGINE 300 UNIT/ML ~~LOC~~ SOPN: 50.0000 [IU] | PEN_INJECTOR | Freq: Every day | SUBCUTANEOUS | Status: DC

## 2015-08-07 NOTE — Progress Notes (Signed)
Subjective:    Patient ID: Meghan Park, female    DOB: [birth year], 45 y.o.   MRN: [id number]  HPI pt states DM was dx'ed in 1997; she has moderate neuropathy of the lower extremities; and associated gastroparesis; she has been on insulin since 2015; pt says her diet is limited by gastroparesis, and and exercise is poor; she has never had GDM, pancreatitis, severe hypoglycemia or DKA.  sge says cbg's are in the 200's.  She did not tolerate metformin (nausea).  She says she takes the toujeo as rx'ed.   Past Medical History  Diagnosis Date  . DM (diabetes mellitus), type 2, uncontrolled ([place])   . Anxiety associated with depression   . Hip pain, right   . Depression   . Anxiety   . Pneumonia hx  . Cystitis   . Cholelithiasis   . Fatty liver   . Arthritis hip, right  . Insomnia   . Hypertension   . Nausea   . Anal fissure   . Chronic headaches   . HLD (hyperlipidemia)   . Pneumonia   . Diabetes mellitus, type II ([place])   . H pylori ulcer   . Gastroparesis   . Adrenal insufficiency ([place])   . Chronic kidney disease     Past Surgical History  Procedure Laterality Date  . Abdominal hysterectomy  2012  . Cholecystectomy  04/13/12  . Ercp  04/24/2012    Procedure: ENDOSCOPIC RETROGRADE CHOLANGIOPANCREATOGRAPHY (ERCP);  Surgeon: Beryle Beams, MD;  Location: [facility];  Service: Endoscopy;  Laterality: N/A;  . Tubal ligation    . Ercp  06/02/2012    Procedure: ENDOSCOPIC RETROGRADE CHOLANGIOPANCREATOGRAPHY (ERCP);  Surgeon: Beryle Beams, MD;  Location: [facility];  Service: Endoscopy;  Laterality: N/A;  . Gallbladder surgery  2013  . Cesarean section  [phone]    x2    Social History   Social History  . Marital Status: Married    Spouse Name: N/A  . Number of Children: 2  . Years of Education: N/A   Occupational History  . coordiantor    Social History Main Topics  . Smoking status: Current Every Day Smoker -- 1.00 packs/day for 23 years    Types: Cigarettes    . Smokeless tobacco: Never Used     Comment: Tobacco info given 02/25/15  . Alcohol Use: No     Comment: quit 18 yrs ago  . Drug Use: No  . Sexual Activity: Yes    Birth Control/ Protection: Other-see comments     Comment: hysterectomy   Other Topics Concern  . 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 Blood Glucose Monitoring Suppl (ONETOUCH VERIO) W/DEVICE KIT 1 Act by Does not apply route 3 (three) times daily. 2 kit 0  . Cholecalciferol (VITAMIN D-1000 MAX ST) 1000 UNITS tablet Take 1,000 Units by mouth.    . clonazePAM (KLONOPIN) 1 MG tablet TAKE 1/2 TO 1 TABLET BY MOUTH EVERY DAY AS NEEDED FOR ANXIETY 30 tablet 1  . cyclobenzaprine (FLEXERIL) 10 MG tablet TAKE 1 TABLET BY MOUTH AT BEDTIME AS NEEDED FOR MUSCLE SPASMS 30 tablet 2  . gabapentin (NEURONTIN) 100 MG capsule Take 1 capsule (100 mg total) by mouth 3 (three) times daily. 90 capsule 11  . glucose blood test strip  Use TID 100 each 12  . hyoscyamine (LEVBID) 0.375 MG 12 hr tablet Take 1 tablet (0.375 mg total) by mouth every 12 (twelve) hours as needed. 60 tablet 11  . Insulin Pen Needle (NOVOFINE) 32G X 6 MM MISC 1 Act by Does not apply route daily. 100 each 3  . mirtazapine (REMERON SOL-TAB) 30 MG disintegrating tablet Take 1 tablet (30 mg total) by mouth at bedtime. 30 tablet 1  . ondansetron (ZOFRAN) 4 MG tablet Take 1 tablet (4 mg total) by mouth 2 (two) times daily. 60 tablet 5  . sertraline (ZOLOFT) 100 MG tablet Take 1.5 tablets (150 mg total) by mouth daily. TAKE 1 TABLET(100 MG) BY MOUTH DAILY 45 tablet 1  . Tiotropium Bromide-Olodaterol (STIOLTO RESPIMAT) 2.5-2.5 MCG/ACT AERS Inhale 2 puffs into the lungs daily. 4 g 11  . traMADol (ULTRAM) 50 MG tablet Take 1 tablet (50 mg total) by mouth every 6 (six) hours as needed. 75 tablet 5  . Vitamin D,  Ergocalciferol, (DRISDOL) 50000 UNITS CAPS capsule Take 1 capsule (50,000 Units total) by mouth 3 (three) times a week. 12 capsule 0  . fluconazole (DIFLUCAN) 150 MG tablet Take 1 tablet (150 mg total) by mouth once. (Patient not taking: Reported on 08/07/2015) 1 tablet 0   No current facility-administered medications on file prior to visit.    Allergies  Allergen Reactions  . Januvia [Sitagliptin]     GI upset  . Morphine And Related Dermatitis and Other (See Comments)    Severe itching leading to welts and open wounds.  Nada Libman [Promethazine Hcl] Other (See Comments)    TD  . Reglan [Metoclopramide] Other (See Comments)    Tremors, jittery  . Farxiga [Dapagliflozin]     yeast    Family History  Problem Relation Age of Onset  . Anxiety disorder Mother   . Hyperlipidemia Mother   . Irritable bowel syndrome Mother   . Arthritis Father   . COPD Father   . Heart disease Father   . Heart disease Maternal Grandfather   . Congestive Heart Failure Maternal Grandmother   . Heart disease Maternal Grandmother   . Heart disease Paternal Grandfather   . Breast cancer Paternal Grandmother     great  . Diabetes Maternal Aunt   . Anxiety disorder Son     social anxiety  . Alcohol abuse Maternal Uncle   . Colon cancer Neg Hx   . Uterine cancer Maternal Aunt     great  . Other Neg Hx     hyponatremia    BP 134/84 mmHg  Pulse 123  Temp(Src) 98.5 F (36.9 C) (Oral)  Ht '5\' 2"'  (1.575 m)  Wt 208 lb (94.348 kg)  BMI 38.03 kg/m2  SpO2 92%  Review of Systems denies chest pain, sob, n/v, urinary frequency, muscle cramps, memory loss, rhinorrhea, and easy bruising.  She has severe weight gain (despite nausea), headache, night sweats, cold intolerance, edema, and slight blurry vision.      Objective:   Physical Exam VS: see vs page GEN: no distress HEAD: head: no deformity eyes: no periorbital swelling, no proptosis external nose and ears are normal mouth: no lesion seen NECK:  supple, thyroid is not enlarged CHEST WALL: no deformity LUNGS:  Clear to auscultation CV: reg rate and rhythm, no murmur ABD: abdomen is soft, nontender.  no hepatosplenomegaly.  not distended.  no hernia.   MUSCULOSKELETAL: muscle bulk and strength are grossly normal.  no obvious joint swelling.  gait  is normal and steady EXTEMITIES: no deformity.  no ulcer on the feet.  feet are of normal color and temp.  Trace bilat leg edema PULSES: dorsalis pedis intact bilat.  no carotid bruit NEURO:  cn 2-12 grossly intact.   readily moves all 4's.  sensation is intact to touch on the feet SKIN:  Normal texture and temperature.  No rash or suspicious lesion is visible.   NODES:  None palpable at the neck PSYCH: alert, well-oriented.  Does not appear anxious nor depressed.   A1c=10.3%  i personally reviewed electrocardiogram tracing (06/17/15): Indication: DM Impression: sinus tachycardia.  I have reviewed outside records, and summarized: Pt was noted to have severely elevated a1c, and referred here.     Assessment & Plan:  DM: severe exacerbation. Obesity: she declines dietician, at least for now.   Hyponatremia: we'll recheck Na+ when glycemic control is improved.  Patient is advised the following: Patient Instructions  good diet and exercise significantly improve the control of your diabetes.  please let me know if you wish to be referred to a dietician.  high blood sugar is very risky to your health.  you should see an eye doctor and dentist every year.  It is very important to get all recommended vaccinations.  controlling your blood pressure and cholesterol drastically reduces the damage diabetes does to your body.  Those who smoke should quit.  please discuss these with your doctor.  check your blood sugar twice a day.  vary the time of day when you check, between before the 3 meals, and at bedtime.  also check if you have symptoms of your blood sugar being too high or too low.  please  keep a record of the readings and bring it to your next appointment here (or you can bring the meter itself).  You can write it on any piece of paper.  please call us sooner if your blood sugar goes below 70, or if you have a lot of readings over 200.  Please stop taking the pioglitizone for now.  Please increase the toujeo to 50 units daily for now.   Our goal for now is to get the blood sugar to the low-100's at some time of day.  Please call sooner if that does not happen.  The next step will be to refine the insulin schedule.  Please come back for a follow-up appointment in 2 weeks.  We'll also recheck the salt level when you come back.

## 2015-08-07 NOTE — Patient Instructions (Addendum)
good diet and exercise significantly improve the control of your diabetes.  please let me know if you wish to be referred to a dietician.  high blood sugar is very risky to your health.  you should see an eye doctor and dentist every year.  It is very important to get all recommended vaccinations.  controlling your blood pressure and cholesterol drastically reduces the damage diabetes does to your body.  Those who smoke should quit.  please discuss these with your doctor.  check your blood sugar twice a day.  vary the time of day when you check, between before the 3 meals, and at bedtime.  also check if you have symptoms of your blood sugar being too high or too low.  please keep a record of the readings and bring it to your next appointment here (or you can bring the meter itself).  You can write it on any piece of paper.  please call us sooner if your blood sugar goes below 70, or if you have a lot of readings over 200.  Please stop taking the pioglitizone for now.  Please increase the toujeo to 50 units daily for now.   Our goal for now is to get the blood sugar to the low-100's at some time of day.  Please call sooner if that does not happen.  The next step will be to refine the insulin schedule.  Please come back for a follow-up appointment in 2 weeks.  We'll also recheck the salt level when you come back.

## 2015-08-15 ENCOUNTER — Ambulatory Visit: Payer: Self-pay | Admitting: Endocrinology

## 2015-08-20 ENCOUNTER — Ambulatory Visit: Payer: Self-pay | Admitting: Endocrinology

## 2015-08-21 ENCOUNTER — Ambulatory Visit: Payer: Self-pay | Admitting: Endocrinology

## 2015-08-29 ENCOUNTER — Encounter: Payer: Self-pay | Admitting: Endocrinology

## 2015-08-29 ENCOUNTER — Ambulatory Visit (INDEPENDENT_AMBULATORY_CARE_PROVIDER_SITE_OTHER): Payer: Managed Care, Other (non HMO) | Admitting: Endocrinology

## 2015-08-29 VITALS — BP 138/84 | HR 117 | Temp 98.7°F | Ht 62.0 in | Wt 215.0 lb

## 2015-08-29 DIAGNOSIS — E118 Type 2 diabetes mellitus with unspecified complications: Secondary | ICD-10-CM | POA: Diagnosis not present

## 2015-08-29 DIAGNOSIS — Z794 Long term (current) use of insulin: Secondary | ICD-10-CM | POA: Diagnosis not present

## 2015-08-29 MED ORDER — INSULIN GLARGINE 100 UNIT/ML SOLOSTAR PEN: 70.0000 [IU] | PEN_INJECTOR | SUBCUTANEOUS | Status: DC

## 2015-08-29 NOTE — Progress Notes (Signed)
Subjective:    Patient ID: Meghan Park, female    DOB: 1971-01-19, 45 y.o.   MRN: [id number]  HPI Pt returns for f/u of diabetes mellitus: DM type: Insulin-requiring type 2 Dx'ed: 3299 Complications: polyneuropathy and gastroparesis Therapy: insulin since 2015 GDM: never DKA: never Severe hypoglycemia: never Pancreatitis: never Other: she did not tolerate metformin (nausea) or pioglitizone (edema). Interval history: no cbg record, but states cbg's vary from 181-200's.  pt states she feels well in general. Past Medical History  Diagnosis Date  . DM (diabetes mellitus), type 2, uncontrolled ([place])   . Anxiety associated with depression   . Hip pain, right   . Depression   . Anxiety   . Pneumonia hx  . Cystitis   . Cholelithiasis   . Fatty liver   . Arthritis hip, right  . Insomnia   . Hypertension   . Nausea   . Anal fissure   . Chronic headaches   . HLD (hyperlipidemia)   . Pneumonia   . Diabetes mellitus, type II ([place])   . H pylori ulcer   . Gastroparesis   . Adrenal insufficiency ([place])   . Chronic kidney disease     Past Surgical History  Procedure Laterality Date  . Abdominal hysterectomy  2012  . Cholecystectomy  04/13/12  . Ercp  04/24/2012    Procedure: ENDOSCOPIC RETROGRADE CHOLANGIOPANCREATOGRAPHY (ERCP);  Surgeon: Beryle Beams, MD;  Location: [facility];  Service: Endoscopy;  Laterality: N/A;  . Tubal ligation    . Ercp  06/02/2012    Procedure: ENDOSCOPIC RETROGRADE CHOLANGIOPANCREATOGRAPHY (ERCP);  Surgeon: Beryle Beams, MD;  Location: [facility];  Service: Endoscopy;  Laterality: N/A;  . Gallbladder surgery  2013  . Cesarean section  [phone]    x2    Social History   Social History  . Marital Status: Married    Spouse Name: N/A  . Number of Children: 2  . Years of Education: N/A   Occupational History  . coordiantor    Social History Main Topics  . Smoking status: Current Every Day Smoker -- 1.00 packs/day for 23 years   Types: Cigarettes  . Smokeless tobacco: Never Used     Comment: Tobacco info given 02/25/15  . Alcohol Use: No     Comment: quit 18 yrs ago  . Drug Use: No  . Sexual Activity: Yes    Birth Control/ Protection: Other-see comments     Comment: hysterectomy   Other Topics Concern  . 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 Blood Glucose Monitoring Suppl (ONETOUCH VERIO) W/DEVICE KIT 1 Act by Does not apply route 3 (three) times daily. 2 kit 0  . Cholecalciferol (VITAMIN D-1000 MAX ST) 1000 UNITS tablet Take 1,000 Units by mouth.    . clonazePAM (KLONOPIN) 1 MG tablet TAKE 1/2 TO 1 TABLET BY MOUTH EVERY DAY AS NEEDED FOR ANXIETY 30 tablet 1  . cyclobenzaprine (FLEXERIL) 10 MG tablet TAKE 1 TABLET BY MOUTH AT BEDTIME AS NEEDED FOR MUSCLE SPASMS 30 tablet 2  . fluconazole (DIFLUCAN) 150 MG tablet Take 1 tablet (150 mg total) by mouth once. 1 tablet 0  . gabapentin (NEURONTIN) 100 MG capsule Take 1 capsule (100 mg total) by mouth 3 (three) times daily. 90 capsule 11  . glucose blood  test strip Use TID 100 each 12  . hyoscyamine (LEVBID) 0.375 MG 12 hr tablet Take 1 tablet (0.375 mg total) by mouth every 12 (twelve) hours as needed. 60 tablet 11  . mirtazapine (REMERON SOL-TAB) 30 MG disintegrating tablet Take 1 tablet (30 mg total) by mouth at bedtime. 30 tablet 1  . ondansetron (ZOFRAN) 4 MG tablet Take 1 tablet (4 mg total) by mouth 2 (two) times daily. 60 tablet 5  . sertraline (ZOLOFT) 100 MG tablet Take 1.5 tablets (150 mg total) by mouth daily. TAKE 1 TABLET(100 MG) BY MOUTH DAILY 45 tablet 1  . Tiotropium Bromide-Olodaterol (STIOLTO RESPIMAT) 2.5-2.5 MCG/ACT AERS Inhale 2 puffs into the lungs daily. 4 g 11  . traMADol (ULTRAM) 50 MG tablet Take 1 tablet (50 mg total) by mouth every 6 (six) hours as needed. 75 tablet 5  .  Vitamin D, Ergocalciferol, (DRISDOL) 50000 UNITS CAPS capsule Take 1 capsule (50,000 Units total) by mouth 3 (three) times a week. 12 capsule 0   No current facility-administered medications on file prior to visit.    Allergies  Allergen Reactions  . Januvia [Sitagliptin]     GI upset  . Morphine And Related Dermatitis and Other (See Comments)    Severe itching leading to welts and open wounds.  Nada Libman [Promethazine Hcl] Other (See Comments)    TD  . Reglan [Metoclopramide] Other (See Comments)    Tremors, jittery  . Farxiga [Dapagliflozin]     yeast    Family History  Problem Relation Age of Onset  . Anxiety disorder Mother   . Hyperlipidemia Mother   . Irritable bowel syndrome Mother   . Arthritis Father   . COPD Father   . Heart disease Father   . Heart disease Maternal Grandfather   . Congestive Heart Failure Maternal Grandmother   . Heart disease Maternal Grandmother   . Heart disease Paternal Grandfather   . Breast cancer Paternal Grandmother     great  . Diabetes Maternal Aunt   . Anxiety disorder Son     social anxiety  . Alcohol abuse Maternal Uncle   . Colon cancer Neg Hx   . Uterine cancer Maternal Aunt     great  . Other Neg Hx     hyponatremia    BP 138/84 mmHg  Pulse 117  Temp(Src) 98.7 F (37.1 C) (Oral)  Ht '5\' 2"'  (1.575 m)  Wt 215 lb (97.523 kg)  BMI 39.31 kg/m2  SpO2 93%  Review of Systems She denies hypoglycemia    Objective:   Physical Exam VITAL SIGNS:  See vs page GENERAL: no distress Pulses: dorsalis pedis intact bilat.   MSK: no deformity of the feet CV: no leg edema Skin:  no ulcer on the feet.  normal color and temp on the feet. Neuro: sensation is intact to touch on the feet.  Lab Results  Component Value Date   HGBA1C 10.3 08/07/2015      Assessment & Plan:  Edema, improved off pioglitizone.  DM: she diabetes.   Patient is advised the following: Patient Instructions  Please change the toujeo to "basaglar," 70  units daily for now.  i have sent a prescription to your pharmacy.   check your blood sugar twice a day.  vary the time of day when you check, between before the 3 meals, and at bedtime.  also check if you have symptoms of your blood sugar being too high or too low.  please keep a record  of the readings and bring it to your next appointment here (or you can bring the meter itself).  You can write it on any piece of paper.  please call us sooner if your blood sugar goes below 70, or if you have a lot of readings over 200. Our goal for now is to get the blood sugar to the low-100's at some time of day.  Please call sooner if that does not happen.  The next step will be to refine the insulin schedule.  Please come back for a follow-up appointment in 1 month.  We'll also recheck the salt level when you come back.

## 2015-08-29 NOTE — Patient Instructions (Addendum)
Please change the toujeo to "basaglar," 70 units daily for now.  i have sent a prescription to your pharmacy.   check your blood sugar twice a day.  vary the time of day when you check, between before the 3 meals, and at bedtime.  also check if you have symptoms of your blood sugar being too high or too low.  please keep a record of the readings and bring it to your next appointment here (or you can bring the meter itself).  You can write it on any piece of paper.  please call us sooner if your blood sugar goes below 70, or if you have a lot of readings over 200. Our goal for now is to get the blood sugar to the low-100's at some time of day.  Please call sooner if that does not happen.  The next step will be to refine the insulin schedule.  Please come back for a follow-up appointment in 1 month.  We'll also recheck the salt level when you come back.

## 2015-09-01 ENCOUNTER — Telehealth: Payer: Self-pay | Admitting: Endocrinology

## 2015-09-01 ENCOUNTER — Encounter: Payer: Self-pay | Admitting: *Deleted

## 2015-09-01 MED ORDER — INSULIN DETEMIR 100 UNIT/ML FLEXPEN: 80.0000 [IU] | PEN_INJECTOR | SUBCUTANEOUS | Status: DC

## 2015-09-01 NOTE — Telephone Encounter (Signed)
please call patient: Ins wants you to change to levemir. i have sent a prescription to your pharmacy It is not a unit-for-units transfer, so we'll have to start at 80 units qam i'll see you next time.

## 2015-09-01 NOTE — Progress Notes (Signed)
Patient ID: Meghan Park, female   DOB: Sep 28, 1970, 45 y.o.   MRN: [id number] Patient did not show up for 09/01/15, 10:00 AM, appointment to have a 48 hour holter monitor applied.

## 2015-09-02 NOTE — Telephone Encounter (Signed)
Pt advised of note below and voiced understanding.  

## 2015-09-09 ENCOUNTER — Other Ambulatory Visit (HOSPITAL_COMMUNITY): Payer: Self-pay | Admitting: Psychiatry

## 2015-09-09 ENCOUNTER — Other Ambulatory Visit: Payer: Self-pay | Admitting: Internal Medicine

## 2015-09-16 ENCOUNTER — Ambulatory Visit (HOSPITAL_COMMUNITY): Payer: Self-pay | Admitting: Psychiatry

## 2015-09-18 ENCOUNTER — Other Ambulatory Visit (HOSPITAL_COMMUNITY): Payer: Self-pay | Admitting: Psychiatry

## 2015-09-30 ENCOUNTER — Ambulatory Visit (INDEPENDENT_AMBULATORY_CARE_PROVIDER_SITE_OTHER): Payer: Managed Care, Other (non HMO) | Admitting: Psychiatry

## 2015-09-30 ENCOUNTER — Encounter (HOSPITAL_COMMUNITY): Payer: Self-pay | Admitting: Psychiatry

## 2015-09-30 VITALS — BP 122/74 | HR 121 | Ht 63.0 in | Wt 214.6 lb

## 2015-09-30 DIAGNOSIS — F331 Major depressive disorder, recurrent, moderate: Secondary | ICD-10-CM

## 2015-09-30 DIAGNOSIS — F411 Generalized anxiety disorder: Secondary | ICD-10-CM

## 2015-09-30 DIAGNOSIS — F4323 Adjustment disorder with mixed anxiety and depressed mood: Secondary | ICD-10-CM

## 2015-09-30 DIAGNOSIS — G4700 Insomnia, unspecified: Secondary | ICD-10-CM | POA: Diagnosis not present

## 2015-09-30 MED ORDER — AMITRIPTYLINE HCL 75 MG PO TABS: 75.0000 mg | ORAL_TABLET | Freq: Every day | ORAL | Status: DC
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
## 2015-10-26 DIAGNOSIS — E119 Type 2 diabetes mellitus without complications: Secondary | ICD-10-CM | POA: Insufficient documentation

## 2015-10-26 NOTE — Patient Instructions (Signed)
Please change the toujeo to "basaglar," 70 units daily for now.  i have sent a prescription to your pharmacy.   check your blood sugar twice a day.  vary the time of day when you check, between before the 3 meals, and at bedtime.  also check if you have symptoms of your blood sugar being too high or too low.  please keep a record of the readings and bring it to your next appointment here (or you can bring the meter itself).  You can write it on any piece of paper.  please call us sooner if your blood sugar goes below 70, or if you have a lot of readings over 200. Our goal for now is to get the blood sugar to the low-100's at some time of day.  Please call sooner if that does not happen.  The next step will be to refine the insulin schedule.  Please come back for a follow-up appointment in 1 month.  We'll also recheck the salt level when you come back.

## 2015-10-26 NOTE — Progress Notes (Signed)
   Subjective:    Patient ID: Meghan Park, female    DOB: May 10, 1971, 45 y.o.   MRN: [id number]  HPI Pt returns for f/u of diabetes mellitus: DM type: Insulin-requiring type 2 Dx'ed: 1997 Complications: polyneuropathy and gastroparesis Therapy: insulin since 2015 GDM: never DKA: never Severe hypoglycemia: never Pancreatitis: never Other: she did not tolerate metformin (nausea) or pioglitizone (edema). Interval history: no cbg record, but states cbg's vary from 181-200's.  pt states she feels well in general.   Review of Systems     Objective:   Physical Exam        Assessment & Plan:   This encounter was created in error - please disregard.

## 2015-10-27 ENCOUNTER — Encounter: Payer: Self-pay | Admitting: Endocrinology

## 2015-10-27 DIAGNOSIS — Z0289 Encounter for other administrative examinations: Secondary | ICD-10-CM

## 2015-10-28 ENCOUNTER — Ambulatory Visit (HOSPITAL_COMMUNITY): Payer: Self-pay | Admitting: Psychiatry

## 2015-11-04 ENCOUNTER — Ambulatory Visit (INDEPENDENT_AMBULATORY_CARE_PROVIDER_SITE_OTHER): Payer: Managed Care, Other (non HMO) | Admitting: Psychiatry

## 2015-11-04 ENCOUNTER — Encounter (HOSPITAL_COMMUNITY): Payer: Self-pay | Admitting: Psychiatry

## 2015-11-04 VITALS — BP 126/78 | HR 125 | Ht 62.0 in | Wt 208.0 lb

## 2015-11-04 DIAGNOSIS — G4700 Insomnia, unspecified: Secondary | ICD-10-CM | POA: Diagnosis not present

## 2015-11-04 DIAGNOSIS — F411 Generalized anxiety disorder: Secondary | ICD-10-CM

## 2015-11-04 DIAGNOSIS — F331 Major depressive disorder, recurrent, moderate: Secondary | ICD-10-CM | POA: Diagnosis not present

## 2015-11-04 DIAGNOSIS — F4323 Adjustment disorder with mixed anxiety and depressed mood: Secondary | ICD-10-CM

## 2015-11-04 MED ORDER — AMITRIPTYLINE HCL 100 MG PO TABS: 100.0000 mg | ORAL_TABLET | Freq: Every day | ORAL | Status: DC
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
## 2015-11-11 ENCOUNTER — Ambulatory Visit: Payer: Self-pay | Admitting: Endocrinology

## 2015-11-11 ENCOUNTER — Encounter: Payer: Self-pay | Admitting: Endocrinology

## 2015-12-02 ENCOUNTER — Encounter: Payer: Self-pay | Admitting: Endocrinology

## 2015-12-02 ENCOUNTER — Ambulatory Visit (INDEPENDENT_AMBULATORY_CARE_PROVIDER_SITE_OTHER): Payer: Managed Care, Other (non HMO) | Admitting: Endocrinology

## 2015-12-02 VITALS — BP 142/90 | HR 119 | Temp 98.5°F | Ht 62.0 in | Wt 201.0 lb

## 2015-12-02 DIAGNOSIS — E1143 Type 2 diabetes mellitus with diabetic autonomic (poly)neuropathy: Secondary | ICD-10-CM | POA: Diagnosis not present

## 2015-12-02 DIAGNOSIS — E871 Hypo-osmolality and hyponatremia: Secondary | ICD-10-CM

## 2015-12-02 DIAGNOSIS — Z794 Long term (current) use of insulin: Secondary | ICD-10-CM

## 2015-12-02 LAB — POCT GLYCOSYLATED HEMOGLOBIN (HGB A1C): HEMOGLOBIN A1C: 8.2

## 2015-12-02 MED ORDER — INSULIN DETEMIR 100 UNIT/ML FLEXPEN: 90.0000 [IU] | PEN_INJECTOR | SUBCUTANEOUS | Status: DC

## 2015-12-02 NOTE — Progress Notes (Signed)
Subjective:    Patient ID: Meghan Park, female    DOB: [birth year], 45 y.o.   MRN: [id number]  HPI Pt returns for f/u of diabetes mellitus: DM type: Insulin-requiring type 2 Dx'ed: 0630 Complications: polyneuropathy and gastroparesis. Therapy: insulin since 2015 GDM: never DKA: never Severe hypoglycemia: never.  Pancreatitis: never Other: she did not tolerate metformin (nausea) or pioglitizone (edema); ins preferred levemir as basal insulin.  She is on qd insulin, at least for now.  Interval history: no cbg record, but states cbg's vary from 130-310.  pt states she feels well in general.  Past Medical History  Diagnosis Date  . DM (diabetes mellitus), type 2, uncontrolled ([place])   . Anxiety associated with depression   . Hip pain, right   . Depression   . Anxiety   . Pneumonia hx  . Cystitis   . Cholelithiasis   . Fatty liver   . Arthritis hip, right  . Insomnia   . Hypertension   . Nausea   . Anal fissure   . Chronic headaches   . HLD (hyperlipidemia)   . Pneumonia   . Diabetes mellitus, type II ([place])   . H pylori ulcer   . Gastroparesis   . Adrenal insufficiency ([place])   . Chronic kidney disease     Past Surgical History  Procedure Laterality Date  . Abdominal hysterectomy  2012  . Cholecystectomy  04/13/12  . Ercp  04/24/2012    Procedure: ENDOSCOPIC RETROGRADE CHOLANGIOPANCREATOGRAPHY (ERCP);  Surgeon: Beryle Beams, MD;  Location: [facility];  Service: Endoscopy;  Laterality: N/A;  . Tubal ligation    . Ercp  06/02/2012    Procedure: ENDOSCOPIC RETROGRADE CHOLANGIOPANCREATOGRAPHY (ERCP);  Surgeon: Beryle Beams, MD;  Location: [facility];  Service: Endoscopy;  Laterality: N/A;  . Gallbladder surgery  2013  . Cesarean section  [phone]    x2    Social History   Social History  . Marital Status: Married    Spouse Name: N/A  . Number of Children: 2  . Years of Education: N/A   Occupational History  . coordiantor    Social History Main Topics    . Smoking status: Current Every Day Smoker -- 1.00 packs/day for 23 years    Types: Cigarettes  . Smokeless tobacco: Never Used     Comment: Tobacco info given 02/25/15  . Alcohol Use: No     Comment: quit 18 yrs ago  . Drug Use: No  . Sexual Activity: Yes    Birth Control/ Protection: Other-see comments     Comment: hysterectomy   Other Topics Concern  . 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 amitriptyline (ELAVIL) 100 MG tablet Take 1 tablet (100 mg total) by mouth at bedtime. 30 tablet 1  . Blood Glucose Monitoring Suppl (ONETOUCH VERIO) W/DEVICE KIT 1 Act by Does not apply route 3 (three) times daily. 2 kit 0  . Cholecalciferol (VITAMIN D-1000 MAX ST) 1000 UNITS tablet Take 1,000 Units by mouth.    . cyclobenzaprine (FLEXERIL) 10 MG tablet TAKE 1 TABLET BY MOUTH AT BEDTIME AS NEEDED FOR MUSCLE SPASMS 30 tablet 1  . diazepam (VALIUM) 10 MG tablet Place 10 mg vaginally every 8 (eight) hours as needed for anxiety.    . diphenhydramine-acetaminophen (TYLENOL PM)  25-500 MG TABS tablet Take 1 tablet by mouth at bedtime as needed.    . gabapentin (NEURONTIN) 100 MG capsule Take 1 capsule (100 mg total) by mouth 3 (three) times daily. 90 capsule 11  . glucose blood test strip Use TID 100 each 12  . hyoscyamine (LEVBID) 0.375 MG 12 hr tablet Take 1 tablet (0.375 mg total) by mouth every 12 (twelve) hours as needed. 60 tablet 11  . ondansetron (ZOFRAN) 4 MG tablet Take 1 tablet (4 mg total) by mouth 2 (two) times daily. 60 tablet 5  . Tiotropium Bromide-Olodaterol (STIOLTO RESPIMAT) 2.5-2.5 MCG/ACT AERS Inhale 2 puffs into the lungs daily. 4 g 11  . traMADol (ULTRAM) 50 MG tablet Take 1 tablet (50 mg total) by mouth every 6 (six) hours as needed. 75 tablet 5  . Vitamin D, Ergocalciferol, (DRISDOL) 50000 UNITS CAPS capsule Take 1  capsule (50,000 Units total) by mouth 3 (three) times a week. 12 capsule 0   No current facility-administered medications on file prior to visit.    Allergies  Allergen Reactions  . Januvia [Sitagliptin]     GI upset  . Morphine And Related Dermatitis and Other (See Comments)    Severe itching leading to welts and open wounds.  Nada Libman [Promethazine Hcl] Other (See Comments)    TD  . Reglan [Metoclopramide] Other (See Comments)    Tremors, jittery  . Farxiga [Dapagliflozin]     yeast    Family History  Problem Relation Age of Onset  . Anxiety disorder Mother   . Hyperlipidemia Mother   . Irritable bowel syndrome Mother   . Arthritis Father   . COPD Father   . Heart disease Father   . Heart disease Maternal Grandfather   . Congestive Heart Failure Maternal Grandmother   . Heart disease Maternal Grandmother   . Heart disease Paternal Grandfather   . Breast cancer Paternal Grandmother     great  . Diabetes Maternal Aunt   . Anxiety disorder Son     social anxiety  . Alcohol abuse Maternal Uncle   . Colon cancer Neg Hx   . Uterine cancer Maternal Aunt     great  . Other Neg Hx     hyponatremia    BP 142/90 mmHg  Pulse 119  Temp(Src) 98.5 F (36.9 C) (Oral)  Ht 5' 2" (1.575 m)  Wt 201 lb (91.173 kg)  BMI 36.75 kg/m2  SpO2 97%  Review of Systems She denies hypoglycemia.     Objective:   Physical Exam VITAL SIGNS:  See vs page GENERAL: no distress Pulses: dorsalis pedis intact bilat.   MSK: no deformity of the feet.  CV: no leg edema. Skin:  no ulcer on the feet.  normal color and temp on the feet. Neuro: sensation is intact to touch on the feet, but decreased from normal.     A1c=8.2%    Assessment & Plan:  DM: she needs increased rx   Patient is advised the following: Patient Instructions  Please increase the levemir to 90 units each morning.   check your blood sugar twice a day.  vary the time of day when you check, between before the 3  meals, and at bedtime.  also check if you have symptoms of your blood sugar being too high or too low.  please keep a record of the readings and bring it to your next appointment here (or you can bring the meter itself).  You can write it on  any piece of paper.  please call us sooner if your blood sugar goes below 70, or if you have a lot of readings over 200. Our goal for now is to get the blood sugar to the low-100's at some time of day.  Please call sooner if that does not happen.   Please come back for a follow-up appointment in 4 months.   blood tests are requested for you today.  We'll let you know about the results.

## 2015-12-02 NOTE — Patient Instructions (Addendum)
Please increase the levemir to 90 units each morning.   check your blood sugar twice a day.  vary the time of day when you check, between before the 3 meals, and at bedtime.  also check if you have symptoms of your blood sugar being too high or too low.  please keep a record of the readings and bring it to your next appointment here (or you can bring the meter itself).  You can write it on any piece of paper.  please call us sooner if your blood sugar goes below 70, or if you have a lot of readings over 200. Our goal for now is to get the blood sugar to the low-100's at some time of day.  Please call sooner if that does not happen.   Please come back for a follow-up appointment in 4 months.   blood tests are requested for you today.  We'll let you know about the results.

## 2015-12-04 ENCOUNTER — Other Ambulatory Visit: Payer: Self-pay

## 2015-12-19 ENCOUNTER — Encounter: Payer: Self-pay | Admitting: Internal Medicine

## 2015-12-29 ENCOUNTER — Encounter: Payer: Self-pay | Admitting: Internal Medicine

## 2016-01-06 ENCOUNTER — Encounter (HOSPITAL_COMMUNITY): Payer: Self-pay | Admitting: Psychiatry

## 2016-01-06 ENCOUNTER — Ambulatory Visit (INDEPENDENT_AMBULATORY_CARE_PROVIDER_SITE_OTHER): Payer: Managed Care, Other (non HMO) | Admitting: Psychiatry

## 2016-01-06 VITALS — BP 122/84 | HR 111 | Ht 62.0 in | Wt 205.6 lb

## 2016-01-06 DIAGNOSIS — F331 Major depressive disorder, recurrent, moderate: Secondary | ICD-10-CM

## 2016-01-06 DIAGNOSIS — G4700 Insomnia, unspecified: Secondary | ICD-10-CM | POA: Diagnosis not present

## 2016-01-06 DIAGNOSIS — F4323 Adjustment disorder with mixed anxiety and depressed mood: Secondary | ICD-10-CM

## 2016-01-06 DIAGNOSIS — F411 Generalized anxiety disorder: Secondary | ICD-10-CM

## 2016-01-06 MED ORDER — DULOXETINE HCL 30 MG PO CPEP: ORAL_CAPSULE | ORAL | Status: DC
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
## 2016-01-09 ENCOUNTER — Other Ambulatory Visit: Payer: Self-pay | Admitting: Internal Medicine

## 2016-01-09 DIAGNOSIS — E1140 Type 2 diabetes mellitus with diabetic neuropathy, unspecified: Secondary | ICD-10-CM

## 2016-01-09 DIAGNOSIS — M150 Primary generalized (osteo)arthritis: Secondary | ICD-10-CM

## 2016-01-09 DIAGNOSIS — M159 Polyosteoarthritis, unspecified: Secondary | ICD-10-CM

## 2016-01-09 MED ORDER — TRAMADOL HCL 50 MG PO TABS: 50.0000 mg | ORAL_TABLET | Freq: Four times a day (QID) | ORAL | Status: DC | PRN

## 2016-01-12 NOTE — Telephone Encounter (Signed)
Faxed script back to [facility].../lmb 

## 2016-01-13 ENCOUNTER — Ambulatory Visit: Payer: Self-pay | Admitting: Internal Medicine

## 2016-01-13 DIAGNOSIS — Z0289 Encounter for other administrative examinations: Secondary | ICD-10-CM

## 2016-01-26 ENCOUNTER — Encounter: Payer: Self-pay | Admitting: Internal Medicine

## 2016-01-26 ENCOUNTER — Other Ambulatory Visit: Payer: Self-pay | Admitting: Internal Medicine

## 2016-01-26 MED ORDER — FLUCONAZOLE 150 MG PO TABS: 150.0000 mg | ORAL_TABLET | Freq: Once | ORAL | Status: DC

## 2016-02-02 ENCOUNTER — Encounter: Payer: Self-pay | Admitting: Internal Medicine

## 2016-02-02 ENCOUNTER — Ambulatory Visit (INDEPENDENT_AMBULATORY_CARE_PROVIDER_SITE_OTHER): Payer: Managed Care, Other (non HMO) | Admitting: Internal Medicine

## 2016-02-02 ENCOUNTER — Other Ambulatory Visit (INDEPENDENT_AMBULATORY_CARE_PROVIDER_SITE_OTHER): Payer: Managed Care, Other (non HMO)

## 2016-02-02 VITALS — BP 132/86 | HR 110 | Temp 98.2°F | Resp 16 | Ht 62.0 in | Wt 206.0 lb

## 2016-02-02 DIAGNOSIS — B3731 Acute candidiasis of vulva and vagina: Secondary | ICD-10-CM

## 2016-02-02 DIAGNOSIS — D509 Iron deficiency anemia, unspecified: Secondary | ICD-10-CM

## 2016-02-02 DIAGNOSIS — B373 Candidiasis of vulva and vagina: Secondary | ICD-10-CM | POA: Diagnosis not present

## 2016-02-02 LAB — CBC
HEMATOCRIT: 33.9 % — AB (ref 36.0–46.0)
HEMOGLOBIN: 11.9 g/dL — AB (ref 12.0–15.0)
MCHC: 35.1 g/dL (ref 30.0–36.0)
MCV: 88.9 fl (ref 78.0–100.0)
PLATELETS: 205 10*3/uL (ref 150.0–400.0)
RBC: 3.81 Mil/uL — AB (ref 3.87–5.11)
RDW: 13.4 % (ref 11.5–15.5)
WBC: 7.1 10*3/uL (ref 4.0–10.5)

## 2016-02-02 LAB — COMPREHENSIVE METABOLIC PANEL
ALBUMIN: 3.9 g/dL (ref 3.5–5.2)
ALK PHOS: 69 U/L (ref 39–117)
ALT: 12 U/L (ref 0–35)
AST: 14 U/L (ref 0–37)
BILIRUBIN TOTAL: 0.6 mg/dL (ref 0.2–1.2)
BUN: 6 mg/dL (ref 6–23)
CALCIUM: 8.8 mg/dL (ref 8.4–10.5)
CO2: 27 mEq/L (ref 19–32)
CREATININE: 0.61 mg/dL (ref 0.40–1.20)
Chloride: 100 mEq/L (ref 96–112)
GFR: 112.74 mL/min (ref >60.00)
Glucose, Bld: 150 mg/dL — ABNORMAL HIGH (ref 70–99)
Potassium: 3.8 mEq/L (ref 3.5–5.1)
Sodium: 133 mEq/L — ABNORMAL LOW (ref 135–145)
TOTAL PROTEIN: 7.3 g/dL (ref 6.0–8.3)

## 2016-02-02 LAB — VITAMIN B12: VITAMIN B 12: 322 pg/mL (ref 211–911)

## 2016-02-02 LAB — FERRITIN: Ferritin: 25.6 ng/mL (ref 10.0–291.0)

## 2016-02-02 MED ORDER — HYDROCORTISONE 0.5 % EX CREA: 1.0000 "application " | TOPICAL_CREAM | Freq: Two times a day (BID) | CUTANEOUS | Status: DC

## 2016-02-02 MED ORDER — FLUCONAZOLE 150 MG PO TABS: 150.0000 mg | ORAL_TABLET | Freq: Once | ORAL | Status: DC

## 2016-02-02 NOTE — Assessment & Plan Note (Signed)
Not fully resolved due ot her concurrent diabetes. She will take diflucan today and another dose on Thursday. Rx for low potency steroid cream for area of raw skin on the left labia.

## 2016-02-02 NOTE — Assessment & Plan Note (Signed)
No labs in some time and will get CBC, ferritin, B12. Address as indicated.

## 2016-02-02 NOTE — Progress Notes (Signed)
Pre visit review using our clinic review tool, if applicable. No additional management support is needed unless otherwise documented below in the visit note. 

## 2016-02-02 NOTE — Progress Notes (Signed)
   Subjective:    Patient ID: Meghan Park, female    DOB: 03-29-1971, 45 y.o.   MRN: [id number]  HPI The patient is a 45 YO female coming in for concern about spot on her labia which is raw and bleeding for 5 days. She has had boils in the past. No recent pap smear s/p hysterectomy. She does have concurrent diabetes. Had recent yeast infection and took diflucan 01/26/16. This did help this resolve somewhat. Wants to get her labs done as she missed her appointment a couple weeks ago. No fevers or chills. No burning with urination and no new sexual partners.   Review of Systems  Constitutional: Negative for fever, chills, activity change, appetite change, fatigue and unexpected weight change.  Respiratory: Negative.   Cardiovascular: Negative.   Gastrointestinal: Negative.   Genitourinary: Positive for vaginal bleeding and vaginal discharge. Negative for dysuria, urgency, hematuria, flank pain, enuresis, genital sores, vaginal pain and pelvic pain.  Musculoskeletal: Negative.       Objective:   Physical Exam  Constitutional: She is oriented to person, place, and time. She appears well-developed and well-nourished.  HENT:  Head: Normocephalic and atraumatic.  Cardiovascular: Normal rate and regular rhythm.   Pulmonary/Chest: Effort normal and breath sounds normal.  Abdominal: Soft.  Genitourinary:  Vagina with white discharge, some candidiasis in the inguinal folds as well. Small area of raw skin left labia without cyst or abscess.   Neurological: She is alert and oriented to person, place, and time.   Filed Vitals:   02/02/16 0926  BP: 132/86  Pulse: 110  Temp: 98.2 F (36.8 C)  TempSrc: Oral  Resp: 16  Height: 5\' 2"  (1.575 m)  Weight: 206 lb (93.441 kg)  SpO2: 98%      Assessment & Plan:

## 2016-02-02 NOTE — Patient Instructions (Signed)
We have sent in more of the diflucan and I want you to take 1 pill today, then 1 pill on Thursday to help get rid of the rest of the yeast.   We have also sent in a cream to use on that spot to help it help. It is not an abscess or cyst.

## 2016-02-15 ENCOUNTER — Encounter: Payer: Self-pay | Admitting: Internal Medicine

## 2016-02-15 NOTE — Telephone Encounter (Signed)
Ok for office to call pt - ? Can she be seen mon July 24 for ? uti

## 2016-02-16 ENCOUNTER — Encounter: Payer: Self-pay | Admitting: Family

## 2016-02-16 ENCOUNTER — Ambulatory Visit (INDEPENDENT_AMBULATORY_CARE_PROVIDER_SITE_OTHER): Payer: Managed Care, Other (non HMO) | Admitting: Family

## 2016-02-16 ENCOUNTER — Other Ambulatory Visit: Payer: Managed Care, Other (non HMO)

## 2016-02-16 VITALS — BP 124/86 | HR 97 | Temp 97.7°F | Resp 18 | Ht 62.0 in | Wt 204.0 lb

## 2016-02-16 DIAGNOSIS — R3915 Urgency of urination: Secondary | ICD-10-CM | POA: Insufficient documentation

## 2016-02-16 LAB — POCT URINALYSIS DIPSTICK
BILIRUBIN UA: NEGATIVE
GLUCOSE UA: NEGATIVE
Leukocytes, UA: NEGATIVE
NITRITE UA: NEGATIVE
Protein, UA: NEGATIVE
RBC UA: NEGATIVE
SPEC GRAV UA: 1.01
Urobilinogen, UA: NEGATIVE
pH, UA: 6

## 2016-02-16 MED ORDER — SULFAMETHOXAZOLE-TRIMETHOPRIM 800-160 MG PO TABS: 1.0000 | ORAL_TABLET | Freq: Two times a day (BID) | ORAL | 0 refills | Status: DC

## 2016-02-16 NOTE — Progress Notes (Signed)
Subjective:    Patient ID: Meghan Park, female    DOB: 05-Feb-1971, 45 y.o.   MRN: [id number]  Chief Complaint  Patient presents with  . Flank Pain    having right sided flank pain that goes down in groin area, has urinary urgency, a little dysuria, hematuria, x2 weeks    HPI:  Meghan Park is a 45 y.o. female who  has a past medical history of Adrenal insufficiency ([place]); Anal fissure; Anxiety; Anxiety associated with depression; Arthritis (hip, right); Cholelithiasis; Chronic headaches; Chronic kidney disease; Cystitis; Depression; Diabetes mellitus, type II ([place]); DM (diabetes mellitus), type 2, uncontrolled ([place]); Fatty liver; Gastroparesis; H pylori ulcer; Hip pain, right; HLD (hyperlipidemia); Hypertension; Insomnia; Nausea; Pneumonia (hx); and Pneumonia. and presents today for an acute office visit.   This is a new problem. Associated symptom of flank pain, urinary urgency with occasional frequency and some blood has been going on for about 2 weeks. Denies fevers. Was recently started on doxycyline for small intestine bacterial overgrowth. Course of the symptoms have gradually worsened.   Allergies  Allergen Reactions  . Buprenorphine Hcl Dermatitis    Other reaction(s): Other (See Comments) Severe itching leading to welts and open wounds.  Celesta Gentile [Sitagliptin]     GI upset  . Morphine And Related Dermatitis and Other (See Comments)    Severe itching leading to welts and open wounds.  Nada Libman [Promethazine Hcl] Other (See Comments)    TD  . Promethazine     Other reaction(s): Other (See Comments) TD  . Reglan [Metoclopramide] Other (See Comments)    Other reaction(s): Other (See Comments), Tremor (intolerance) Tremors, jittery And headache Tremors, jittery  . Farxiga [Dapagliflozin]     yeast     Current Outpatient Prescriptions on File Prior to Visit  Medication Sig Dispense Refill  . Blood Glucose Monitoring Suppl (ONETOUCH VERIO) W/DEVICE KIT 1 Act by  Does not apply route 3 (three) times daily. 2 kit 0  . Cholecalciferol (VITAMIN D-1000 MAX ST) 1000 UNITS tablet Take 1,000 Units by mouth. Reported on 02/02/2016    . cyclobenzaprine (FLEXERIL) 10 MG tablet TAKE 1 TABLET BY MOUTH AT BEDTIME AS NEEDED FOR MUSCLE SPASMS 30 tablet 0  . diazepam (VALIUM) 10 MG tablet Place 10 mg vaginally every 8 (eight) hours as needed for anxiety.    . dicyclomine (BENTYL) 10 MG capsule Take 10 mg by mouth 4 (four) times daily.    . diphenhydramine-acetaminophen (TYLENOL PM) 25-500 MG TABS tablet Take 1 tablet by mouth at bedtime as needed.    . DULoxetine (CYMBALTA) 30 MG capsule Start January 21, 2016. Take one tab po qD. 30 capsule 1  . fluconazole (DIFLUCAN) 150 MG tablet Take 1 tablet (150 mg total) by mouth once. 2 tablet 0  . gabapentin (NEURONTIN) 100 MG capsule Take 1 capsule (100 mg total) by mouth 3 (three) times daily. 90 capsule 11  . glucose blood test strip Use TID 100 each 12  . hydrocortisone cream 0.5 % Apply 1 application topically 2 (two) times daily. 30 g 0  . hyoscyamine (LEVBID) 0.375 MG 12 hr tablet Take 1 tablet (0.375 mg total) by mouth every 12 (twelve) hours as needed. 60 tablet 11  . Insulin Detemir (LEVEMIR FLEXTOUCH) 100 UNIT/ML Pen Inject 90 Units into the skin every morning. And pen needles 2/day 30 mL 11  . ondansetron (ZOFRAN) 4 MG tablet Take 1 tablet (4 mg total) by mouth 2 (two) times daily. 60 tablet  5  . traMADol (ULTRAM) 50 MG tablet Take 1 tablet (50 mg total) by mouth every 6 (six) hours as needed. 75 tablet 5  . Vitamin D, Ergocalciferol, (DRISDOL) 50000 UNITS CAPS capsule Take 1 capsule (50,000 Units total) by mouth 3 (three) times a week. 12 capsule 0   No current facility-administered medications on file prior to visit.      Review of Systems  Constitutional: Negative for chills and fever.  Genitourinary: Positive for flank pain, frequency, hematuria and urgency. Negative for dysuria.  Neurological: Negative for  dizziness and weakness.      Objective:    BP 124/86 (BP Location: Left Arm, Patient Position: Sitting, Cuff Size: Normal)   Pulse 97   Temp 97.7 F (36.5 C) (Oral)   Resp 18   Ht '5\' 2"'  (1.575 m)   Wt 204 lb (92.5 kg)   SpO2 96%   BMI 37.31 kg/m  Nursing note and vital signs reviewed.  Physical Exam  Constitutional: She is oriented to person, place, and time. She appears well-developed and well-nourished. No distress.  Cardiovascular: Normal rate, regular rhythm, normal heart sounds and intact distal pulses.   Pulmonary/Chest: Effort normal and breath sounds normal.  Abdominal: There is CVA tenderness.  Neurological: She is alert and oriented to person, place, and time.  Skin: Skin is warm and dry.  Psychiatric: She has a normal mood and affect. Her behavior is normal. Judgment and thought content normal.       Assessment & Plan:   Problem List Items Addressed This Visit      Other   Urinary urgency - Primary    In office urinalysis negative for nitrites, leukocytes, and hematuria. Obtain urine culture. Given current symptoms and progressive worsening start trimethoprim-sulfamethoxazole. Encouraged to drink plenty of fluids. Continue to monitor for worsening symptoms or fevers develop.      Relevant Medications   sulfamethoxazole-trimethoprim (BACTRIM DS,SEPTRA DS) 800-160 MG tablet   Other Relevant Orders   POCT urinalysis dipstick (Completed)   Urine culture    Other Visit Diagnoses   None.      I have discontinued Ms. Cerullo's Tiotropium Bromide-Olodaterol. I am also having her start on sulfamethoxazole-trimethoprim. Additionally, I am having her maintain her ondansetron, Vitamin D (Ergocalciferol), hyoscyamine, gabapentin, Cholecalciferol, glucose blood, ONETOUCH VERIO, diazepam, diphenhydramine-acetaminophen, Insulin Detemir, DULoxetine, cyclobenzaprine, traMADol, dicyclomine, hydrocortisone cream, and fluconazole.   Meds ordered this encounter  Medications    . sulfamethoxazole-trimethoprim (BACTRIM DS,SEPTRA DS) 800-160 MG tablet    Sig: Take 1 tablet by mouth 2 (two) times daily.    Dispense:  10 tablet    Refill:  0    Order Specific Question:   Supervising Provider    Answer:   Pricilla Holm A [[id number]]     Follow-up: Return if symptoms worsen or fail to improve.  Mauricio Po, FNP

## 2016-02-16 NOTE — Telephone Encounter (Signed)
Got patient in with Meghan Park this morning.

## 2016-02-16 NOTE — Assessment & Plan Note (Signed)
In office urinalysis negative for nitrites, leukocytes, and hematuria. Obtain urine culture. Given current symptoms and progressive worsening start trimethoprim-sulfamethoxazole. Encouraged to drink plenty of fluids. Continue to monitor for worsening symptoms or fevers develop.

## 2016-02-16 NOTE — Patient Instructions (Signed)
Thank you for choosing [facility].  Summary/Instructions:  Please continue to take your medications as prescribed.  Start the Bactrim twice daily until completed.  If your symptoms worsen or fail to improve, please contact our office for further instruction, or in case of emergency go directly to the emergency room at the closest medical facility.    Urinary Tract Infection Urinary tract infections (UTIs) can develop anywhere along your urinary tract. Your urinary tract is your body's drainage system for removing wastes and extra water. Your urinary tract includes two kidneys, two ureters, a bladder, and a urethra. Your kidneys are a pair of bean-shaped organs. Each kidney is about the size of your fist. They are located below your ribs, one on each side of your spine. CAUSES Infections are caused by microbes, which are microscopic organisms, including fungi, viruses, and bacteria. These organisms are so small that they can only be seen through a microscope. Bacteria are the microbes that most commonly cause UTIs. SYMPTOMS  Symptoms of UTIs may vary by age and gender of the patient and by the location of the infection. Symptoms in young women typically include a frequent and intense urge to urinate and a painful, burning feeling in the bladder or urethra during urination. Older women and men are more likely to be tired, shaky, and weak and have muscle aches and abdominal pain. A fever may mean the infection is in your kidneys. Other symptoms of a kidney infection include pain in your back or sides below the ribs, nausea, and vomiting. DIAGNOSIS To diagnose a UTI, your caregiver will ask you about your symptoms. Your caregiver will also ask you to provide a urine sample. The urine sample will be tested for bacteria and white blood cells. White blood cells are made by your body to help fight infection. TREATMENT  Typically, UTIs can be treated with medication. Because most UTIs are caused  by a bacterial infection, they usually can be treated with the use of antibiotics. The choice of antibiotic and length of treatment depend on your symptoms and the type of bacteria causing your infection. HOME CARE INSTRUCTIONS  If you were prescribed antibiotics, take them exactly as your caregiver instructs you. Finish the medication even if you feel better after you have only taken some of the medication.  Drink enough water and fluids to keep your urine clear or pale yellow.  Avoid caffeine, tea, and carbonated beverages. They tend to irritate your bladder.  Empty your bladder often. Avoid holding urine for long periods of time.  Empty your bladder before and after sexual intercourse.  After a bowel movement, women should cleanse from front to back. Use each tissue only once. SEEK MEDICAL CARE IF:   You have back pain.  You develop a fever.  Your symptoms do not begin to resolve within 3 days. SEEK IMMEDIATE MEDICAL CARE IF:   You have severe back pain or lower abdominal pain.  You develop chills.  You have nausea or vomiting.  You have continued burning or discomfort with urination. MAKE SURE YOU:   Understand these instructions.  Will watch your condition.  Will get help right away if you are not doing well or get worse.   This information is not intended to replace advice given to you by your health care provider. Make sure you discuss any questions you have with your health care provider.   Document Released: 04/21/2005 Document Revised: 04/02/2015 Document Reviewed: 08/20/2011 Elsevier Interactive Patient Education [employer]

## 2016-02-17 ENCOUNTER — Ambulatory Visit: Payer: Self-pay | Admitting: Internal Medicine

## 2016-02-17 NOTE — Telephone Encounter (Signed)
Meghan Park or staff to call pt   - needs OV

## 2016-02-18 ENCOUNTER — Encounter: Payer: Self-pay | Admitting: Family

## 2016-02-18 LAB — URINE CULTURE

## 2016-02-20 ENCOUNTER — Telehealth: Payer: Self-pay | Admitting: Endocrinology

## 2016-02-20 NOTE — Telephone Encounter (Signed)
We can test adrenal function here, but this is not a painful condition.

## 2016-02-20 NOTE — Telephone Encounter (Signed)
Please advise 

## 2016-02-20 NOTE — Telephone Encounter (Signed)
PT stated she has been having pain and trouble with her adrenal glands, she didn't know if she needed to come in to see Dr. Everardo All or go see her urologist.

## 2016-02-23 NOTE — Telephone Encounter (Signed)
I contacted the pt and advised of note below. Pt stated since Dr. Everardo All advised the adrenal function is not normally related to pain she will follow up with her PCP to verify if something else is the cause of the pain. Pt will will keep her follow up with [place] in September as scheduled.

## 2016-02-27 ENCOUNTER — Other Ambulatory Visit: Payer: Self-pay | Admitting: Internal Medicine

## 2016-02-27 DIAGNOSIS — K3184 Gastroparesis: Secondary | ICD-10-CM

## 2016-03-11 ENCOUNTER — Ambulatory Visit (INDEPENDENT_AMBULATORY_CARE_PROVIDER_SITE_OTHER): Payer: Managed Care, Other (non HMO) | Admitting: Psychiatry

## 2016-03-11 ENCOUNTER — Encounter (HOSPITAL_COMMUNITY): Payer: Self-pay | Admitting: Psychiatry

## 2016-03-11 DIAGNOSIS — F411 Generalized anxiety disorder: Secondary | ICD-10-CM | POA: Diagnosis not present

## 2016-03-11 DIAGNOSIS — F4323 Adjustment disorder with mixed anxiety and depressed mood: Secondary | ICD-10-CM | POA: Diagnosis not present

## 2016-03-11 DIAGNOSIS — G4700 Insomnia, unspecified: Secondary | ICD-10-CM

## 2016-03-11 DIAGNOSIS — F331 Major depressive disorder, recurrent, moderate: Secondary | ICD-10-CM | POA: Diagnosis not present

## 2016-03-11 MED ORDER — SERTRALINE HCL 100 MG PO TABS: 100.0000 mg | ORAL_TABLET | Freq: Every day | ORAL | 3 refills | Status: DC
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
## 2016-04-02 ENCOUNTER — Telehealth: Payer: Self-pay | Admitting: Endocrinology

## 2016-04-02 ENCOUNTER — Ambulatory Visit (INDEPENDENT_AMBULATORY_CARE_PROVIDER_SITE_OTHER): Payer: Managed Care, Other (non HMO) | Admitting: Endocrinology

## 2016-04-02 ENCOUNTER — Encounter: Payer: Self-pay | Admitting: Endocrinology

## 2016-04-02 VITALS — BP 162/102 | HR 114 | Ht 62.0 in | Wt 201.0 lb

## 2016-04-02 DIAGNOSIS — E871 Hypo-osmolality and hyponatremia: Secondary | ICD-10-CM

## 2016-04-02 DIAGNOSIS — E1143 Type 2 diabetes mellitus with diabetic autonomic (poly)neuropathy: Secondary | ICD-10-CM | POA: Diagnosis not present

## 2016-04-02 DIAGNOSIS — Z794 Long term (current) use of insulin: Secondary | ICD-10-CM | POA: Diagnosis not present

## 2016-04-02 LAB — POCT GLYCOSYLATED HEMOGLOBIN (HGB A1C): Hemoglobin A1C: 8.1

## 2016-04-02 MED ORDER — INSULIN DETEMIR 100 UNIT/ML FLEXPEN: 40.0000 [IU] | PEN_INJECTOR | Freq: Every day | SUBCUTANEOUS | 11 refills | Status: DC

## 2016-04-02 MED ORDER — INSULIN LISPRO 100 UNIT/ML (KWIKPEN): 10.0000 [IU] | PEN_INJECTOR | Freq: Three times a day (TID) | SUBCUTANEOUS | 3 refills | Status: DC

## 2016-04-02 MED ORDER — INSULIN ASPART 100 UNIT/ML FLEXPEN: 10.0000 [IU] | PEN_INJECTOR | Freq: Three times a day (TID) | SUBCUTANEOUS | 11 refills | Status: DC

## 2016-04-02 MED ORDER — METOPROLOL SUCCINATE ER 25 MG PO TB24: 25.0000 mg | ORAL_TABLET | Freq: Every day | ORAL | 3 refills | Status: DC

## 2016-04-02 NOTE — Progress Notes (Signed)
Subjective:    Patient ID: Meghan Park, female    DOB: 01/30/1971, 45 y.o.   MRN: [id number]  HPI Pt returns for f/u of diabetes mellitus: DM type: Insulin-requiring type 2 Dx'ed: 1761 Complications: polyneuropathy and gastroparesis. Therapy: insulin since 2015 GDM: never DKA: never Severe hypoglycemia: never.  Pancreatitis: never.  Other: she did not tolerate metformin (nausea) or pioglitizone (edema); ins preferred levemir as basal insulin.  She is on qd insulin, at least for now.  Interval history: no cbg record, but states cbg's vary from 100-214.  It is in general higher as the day goes on.  pt states she feels well in general, except for nausea.  Past Medical History:  Diagnosis Date  . Adrenal insufficiency ([place])   . Anal fissure   . Anxiety   . Anxiety associated with depression   . Arthritis hip, right  . Cholelithiasis   . Chronic headaches   . Chronic kidney disease   . Cystitis   . Depression   . Diabetes mellitus, type II ([place])   . DM (diabetes mellitus), type 2, uncontrolled ([place])   . Fatty liver   . Gastroparesis   . H pylori ulcer   . Hip pain, right   . HLD (hyperlipidemia)   . Hypertension   . Insomnia   . Nausea   . Pneumonia hx  . Pneumonia     Past Surgical History:  Procedure Laterality Date  . ABDOMINAL HYSTERECTOMY  2012  . CESAREAN SECTION  [id number]   x2  . CHOLECYSTECTOMY  04/13/12  . ERCP  04/24/2012   Procedure: ENDOSCOPIC RETROGRADE CHOLANGIOPANCREATOGRAPHY (ERCP);  Surgeon: Beryle Beams, MD;  Location: [facility];  Service: Endoscopy;  Laterality: N/A;  . ERCP  06/02/2012   Procedure: ENDOSCOPIC RETROGRADE CHOLANGIOPANCREATOGRAPHY (ERCP);  Surgeon: Beryle Beams, MD;  Location: [facility];  Service: Endoscopy;  Laterality: N/A;  . GALLBLADDER SURGERY  2013  . TUBAL LIGATION      Social History   Social History  . Marital status: Married    Spouse name: N/A  . Number of children: 2  . Years of education: N/A    Occupational History  . coordiantor [employer]   Social History Main Topics  . Smoking status: Current Every Day Smoker    Packs/day: 1.00    Years: 23.00    Types: Cigarettes  . Smokeless tobacco: Never Used     Comment: Tobacco info given 02/25/15  . Alcohol use No     Comment: quit 18 yrs ago  . Drug use: No  . Sexual activity: Yes    Birth control/ protection: Other-see comments     Comment: hysterectomy   Other Topics Concern  . 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 Blood Glucose Monitoring Suppl (ONETOUCH VERIO) W/DEVICE KIT 1 Act by Does not apply route 3 (three) times daily. 2 kit 0  . Cholecalciferol (VITAMIN D-1000 MAX ST) 1000 UNITS tablet Take 1,000 Units by mouth. Reported on 02/02/2016    . cyclobenzaprine (FLEXERIL) 10 MG tablet TAKE 1 TABLET BY MOUTH AT BEDTIME AS NEEDED FOR MUSCLE SPASMS 30 tablet 0  . diazepam (VALIUM) 10 MG tablet Place 10 mg vaginally every 8 (eight) hours as needed for anxiety.    . dicyclomine (BENTYL) 10 MG  capsule Take 10 mg by mouth 4 (four) times daily.    . diphenhydramine-acetaminophen (TYLENOL PM) 25-500 MG TABS tablet Take 1 tablet by mouth at bedtime as needed.    . fluconazole (DIFLUCAN) 150 MG tablet Take 1 tablet (150 mg total) by mouth once. 2 tablet 0  . gabapentin (NEURONTIN) 100 MG capsule Take 1 capsule (100 mg total) by mouth 3 (three) times daily. 90 capsule 11  . glucose blood test strip Use TID 100 each 12  . hydrocortisone cream 0.5 % Apply 1 application topically 2 (two) times daily. 30 g 0  . hyoscyamine (LEVBID) 0.375 MG 12 hr tablet Take 1 tablet (0.375 mg total) by mouth every 12 (twelve) hours as needed. 60 tablet 11  . ondansetron (ZOFRAN) 4 MG tablet TAKE 1 TABLET(4 MG) BY MOUTH TWICE DAILY 60 tablet 2  . sertraline (ZOLOFT) 100 MG tablet Take  1 tablet (100 mg total) by mouth daily. 30 tablet 3  . traMADol (ULTRAM) 50 MG tablet Take 1 tablet (50 mg total) by mouth every 6 (six) hours as needed. 75 tablet 5   No current facility-administered medications on file prior to visit.     Allergies  Allergen Reactions  . Buprenorphine Hcl Dermatitis    Other reaction(s): Other (See Comments) Severe itching leading to welts and open wounds.  Celesta Gentile [Sitagliptin]     GI upset  . Morphine And Related Dermatitis and Other (See Comments)    Severe itching leading to welts and open wounds.  Nada Libman [Promethazine Hcl] Other (See Comments)    TD  . Promethazine     Other reaction(s): Other (See Comments) TD  . Reglan [Metoclopramide] Other (See Comments)    Other reaction(s): Other (See Comments), Tremor (intolerance) Tremors, jittery And headache Tremors, jittery  . Farxiga [Dapagliflozin]     yeast    Family History  Problem Relation Age of Onset  . Anxiety disorder Mother   . Hyperlipidemia Mother   . Irritable bowel syndrome Mother   . Arthritis Father   . COPD Father   . Heart disease Father   . Heart disease Maternal Grandfather   . Congestive Heart Failure Maternal Grandmother   . Heart disease Maternal Grandmother   . Heart disease Paternal Grandfather   . Breast cancer Paternal Grandmother     great  . Anxiety disorder Son     social anxiety  . Diabetes Maternal Aunt   . Alcohol abuse Maternal Uncle   . Uterine cancer Maternal Aunt     great  . Colon cancer Neg Hx   . Other Neg Hx     hyponatremia    BP (!) 162/102   Pulse (!) 114   Ht 5' 2" (1.575 m)   Wt 201 lb (91.2 kg)   SpO2 95%   BMI 36.76 kg/m   Review of Systems She denies hypoglycemia.      Objective:   Physical Exam VITAL SIGNS:  See vs page GENERAL: no distress Pulses: dorsalis pedis intact bilat.   MSK: no deformity of the feet.  CV: no leg edema. Skin:  no ulcer on the feet.  normal color and temp on the feet. Neuro:  sensation is intact to touch on the feet, but decreased from normal.     A1c=8.1%    Assessment & Plan:  Gastroparesis: multiple daily injections will help this.   Hyponatremia: I advised ACTH stim test.  She says she doesn't have time to do today.  We'll do next time.  HTN: we'll recheck next time.

## 2016-04-02 NOTE — Telephone Encounter (Signed)
See message and please advise, Thanks!  

## 2016-04-02 NOTE — Patient Instructions (Addendum)
Please change the levemir to 40 units at bedtime, and: Add novolog, 10 units 3 times a day (just before each meal) I have sent a prescription to your pharmacy, to help the blood pressure and heart rate. We'll plan to check the cortisone blood test next time. check your blood sugar twice a day.  vary the time of day when you check, between before the 3 meals, and at bedtime.  also check if you have symptoms of your blood sugar being too high or too low.  please keep a record of the readings and bring it to your next appointment here (or you can bring the meter itself).  You can write it on any piece of paper.  please call us sooner if your blood sugar goes below 70, or if you have a lot of readings over 200.  Please come back for a follow-up appointment in 1 month.

## 2016-04-02 NOTE — Telephone Encounter (Signed)
ok 

## 2016-04-02 NOTE — Telephone Encounter (Signed)
novolog to be changed to humalog is that ok humalog is what will be covered by the insurance coverage

## 2016-04-02 NOTE — Telephone Encounter (Signed)
Prescription submitted.

## 2016-05-03 ENCOUNTER — Ambulatory Visit: Payer: Self-pay | Admitting: Endocrinology

## 2016-05-04 ENCOUNTER — Encounter: Payer: Self-pay | Admitting: Endocrinology

## 2016-05-12 ENCOUNTER — Encounter: Payer: Self-pay | Admitting: Endocrinology

## 2016-05-12 ENCOUNTER — Ambulatory Visit (INDEPENDENT_AMBULATORY_CARE_PROVIDER_SITE_OTHER): Payer: Managed Care, Other (non HMO) | Admitting: Endocrinology

## 2016-05-12 VITALS — BP 132/80 | HR 96 | Ht 62.0 in | Wt 199.0 lb

## 2016-05-12 DIAGNOSIS — E871 Hypo-osmolality and hyponatremia: Secondary | ICD-10-CM | POA: Diagnosis not present

## 2016-05-12 LAB — CORTISOL
Cortisol, Plasma: 5 ug/dL
Cortisol, Plasma: 22.1 ug/dL

## 2016-05-12 MED ORDER — COSYNTROPIN NICU IV SYRINGE 0.25 MG/ML (STANDARD DOSE)
15.0000 ug/kg | Freq: Once | INTRAVENOUS | Status: AC
  Administered 2016-05-12: 1.355 mg via INTRAMUSCULAR

## 2016-05-12 MED ORDER — INSULIN LISPRO 100 UNIT/ML (KWIKPEN): 12.0000 [IU] | PEN_INJECTOR | Freq: Three times a day (TID) | SUBCUTANEOUS | 3 refills | Status: DC

## 2016-05-12 NOTE — Progress Notes (Signed)
Subjective:    Patient ID: Meghan Park, female    DOB: Oct 18, 1970, 45 y.o.   MRN: [id number]  HPI Pt returns for f/u of diabetes mellitus: DM type: Insulin-requiring type 2 Dx'ed: 9892 Complications: polyneuropathy and gastroparesis. Therapy: insulin since 2015 GDM: never DKA: never Severe hypoglycemia: never.  Pancreatitis: never.  Other: she did not tolerate metformin (nausea) or pioglitizone (edema); She takes multiple daily injections; the pattern of cbg's indicates she does not need basal insulin.  Interval history: no cbg record, but states cbg's vary from 110-150.  It is still in general higher as the day goes on.  pt states she feels well in general, except for ongoing nausea.  Past Medical History:  Diagnosis Date  . Adrenal insufficiency ([place])   . Anal fissure   . Anxiety   . Anxiety associated with depression   . Arthritis hip, right  . Cholelithiasis   . Chronic headaches   . Chronic kidney disease   . Cystitis   . Depression   . Diabetes mellitus, type II ([place])   . DM (diabetes mellitus), type 2, uncontrolled ([place])   . Fatty liver   . Gastroparesis   . H pylori ulcer   . Hip pain, right   . HLD (hyperlipidemia)   . Hypertension   . Insomnia   . Nausea   . Pneumonia hx  . Pneumonia     Past Surgical History:  Procedure Laterality Date  . ABDOMINAL HYSTERECTOMY  2012  . CESAREAN SECTION  [id number]   x2  . CHOLECYSTECTOMY  04/13/12  . ERCP  04/24/2012   Procedure: ENDOSCOPIC RETROGRADE CHOLANGIOPANCREATOGRAPHY (ERCP);  Surgeon: Beryle Beams, MD;  Location: [facility];  Service: Endoscopy;  Laterality: N/A;  . ERCP  06/02/2012   Procedure: ENDOSCOPIC RETROGRADE CHOLANGIOPANCREATOGRAPHY (ERCP);  Surgeon: Beryle Beams, MD;  Location: [facility];  Service: Endoscopy;  Laterality: N/A;  . GALLBLADDER SURGERY  2013  . TUBAL LIGATION      Social History   Social History  . Marital status: Married    Spouse name: N/A  . Number of children: 2  .  Years of education: N/A   Occupational History  . coordiantor [employer]   Social History Main Topics  . Smoking status: Current Every Day Smoker    Packs/day: 1.00    Years: 23.00    Types: Cigarettes  . Smokeless tobacco: Never Used     Comment: Tobacco info given 02/25/15  . Alcohol use No     Comment: quit 18 yrs ago  . Drug use: No  . Sexual activity: Yes    Birth control/ protection: Other-see comments     Comment: hysterectomy   Other Topics Concern  . 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 Blood Glucose Monitoring Suppl (ONETOUCH VERIO) W/DEVICE KIT 1 Act by Does not apply route 3 (three) times daily. 2 kit 0  . Cholecalciferol (VITAMIN D-1000 MAX ST) 1000 UNITS tablet Take 1,000 Units by mouth. Reported on 02/02/2016    . cyclobenzaprine (FLEXERIL) 10 MG tablet TAKE 1 TABLET BY MOUTH AT BEDTIME AS NEEDED FOR MUSCLE SPASMS 30 tablet 0  . diazepam (VALIUM) 10 MG tablet Place 10 mg vaginally every 8 (eight) hours as needed for anxiety.    . dicyclomine (BENTYL)  10 MG capsule Take 10 mg by mouth 4 (four) times daily.    . diphenhydramine-acetaminophen (TYLENOL PM) 25-500 MG TABS tablet Take 1 tablet by mouth at bedtime as needed.    . fluconazole (DIFLUCAN) 150 MG tablet Take 1 tablet (150 mg total) by mouth once. 2 tablet 0  . gabapentin (NEURONTIN) 100 MG capsule Take 1 capsule (100 mg total) by mouth 3 (three) times daily. 90 capsule 11  . glucose blood test strip Use TID 100 each 12  . hydrocortisone cream 0.5 % Apply 1 application topically 2 (two) times daily. 30 g 0  . hyoscyamine (LEVBID) 0.375 MG 12 hr tablet Take 1 tablet (0.375 mg total) by mouth every 12 (twelve) hours as needed. 60 tablet 11  . metoprolol succinate (TOPROL-XL) 25 MG 24 hr tablet Take 1 tablet (25 mg total) by mouth daily. 90 tablet  3  . ondansetron (ZOFRAN) 4 MG tablet TAKE 1 TABLET(4 MG) BY MOUTH TWICE DAILY 60 tablet 2  . sertraline (ZOLOFT) 100 MG tablet Take 1 tablet (100 mg total) by mouth daily. 30 tablet 3  . traMADol (ULTRAM) 50 MG tablet Take 1 tablet (50 mg total) by mouth every 6 (six) hours as needed. 75 tablet 5   No current facility-administered medications on file prior to visit.     Allergies  Allergen Reactions  . Buprenorphine Hcl Dermatitis    Other reaction(s): Other (See Comments) Severe itching leading to welts and open wounds.  Celesta Gentile [Sitagliptin]     GI upset  . Morphine And Related Dermatitis and Other (See Comments)    Severe itching leading to welts and open wounds.  Nada Libman [Promethazine Hcl] Other (See Comments)    TD  . Promethazine     Other reaction(s): Other (See Comments) TD  . Reglan [Metoclopramide] Other (See Comments)    Other reaction(s): Other (See Comments), Tremor (intolerance) Tremors, jittery And headache Tremors, jittery  . Farxiga [Dapagliflozin]     yeast    Family History  Problem Relation Age of Onset  . Anxiety disorder Mother   . Hyperlipidemia Mother   . Irritable bowel syndrome Mother   . Arthritis Father   . COPD Father   . Heart disease Father   . Heart disease Maternal Grandfather   . Congestive Heart Failure Maternal Grandmother   . Heart disease Maternal Grandmother   . Heart disease Paternal Grandfather   . Breast cancer Paternal Grandmother     great  . Anxiety disorder Son     social anxiety  . Diabetes Maternal Aunt   . Alcohol abuse Maternal Uncle   . Uterine cancer Maternal Aunt     great  . Colon cancer Neg Hx   . Other Neg Hx     hyponatremia    BP 132/80   Pulse 96   Ht _0  (1.575 m)   Wt 199 lb (90.3 kg)   SpO2 98%   BMI 36.40 kg/m    Review of Systems She denies hypoglycemia    Objective:   Physical Exam VITAL SIGNS:  See vs page GENERAL: no distress /Pulses: drsalis pedis intact bilat.   MSK:  no deformity of the feet CV: no leg edema Skin:  no ulcer on the feet.  normal color and temp on the feet. Neuro: sensation is intact to touch on the feet, but decreased from normal.    Lab Results  Component Value Date   HGBA1C 8.1 04/02/2016  Assessment & Plan:  Insulin-requiring type 2 DM: she needs increased rx Patient is advised the following: Patient Instructions  Please increase humalog to 12 units 3 times a day (just before each meal) Please continue the same metoprolol. blood tests are requested for you today.  We'll let you know about the results.   check your blood sugar twice a day.  vary the time of day when you check, between before the 3 meals, and at bedtime.  also check if you have symptoms of your blood sugar being too high or too low.  please keep a record of the readings and bring it to your next appointment here (or you can bring the meter itself).  You can write it on any piece of paper.  please call us sooner if your blood sugar goes below 70, or if you have a lot of readings over 200.  Please come back for a follow-up appointment in 3 months.

## 2016-05-12 NOTE — Patient Instructions (Addendum)
Please increase humalog to 12 units 3 times a day (just before each meal) Please continue the same metoprolol. blood tests are requested for you today.  We'll let you know about the results.   check your blood sugar twice a day.  vary the time of day when you check, between before the 3 meals, and at bedtime.  also check if you have symptoms of your blood sugar being too high or too low.  please keep a record of the readings and bring it to your next appointment here (or you can bring the meter itself).  You can write it on any piece of paper.  please call us sooner if your blood sugar goes below 70, or if you have a lot of readings over 200.  Please come back for a follow-up appointment in 3 months.

## 2016-05-20 ENCOUNTER — Ambulatory Visit (INDEPENDENT_AMBULATORY_CARE_PROVIDER_SITE_OTHER): Payer: Managed Care, Other (non HMO) | Admitting: Psychiatry

## 2016-05-20 ENCOUNTER — Encounter (HOSPITAL_COMMUNITY): Payer: Self-pay | Admitting: Psychiatry

## 2016-05-20 VITALS — BP 132/86 | HR 96 | Ht 63.0 in | Wt 198.8 lb

## 2016-05-20 DIAGNOSIS — F17210 Nicotine dependence, cigarettes, uncomplicated: Secondary | ICD-10-CM

## 2016-05-20 DIAGNOSIS — F5105 Insomnia due to other mental disorder: Secondary | ICD-10-CM

## 2016-05-20 DIAGNOSIS — Z813 Family history of other psychoactive substance abuse and dependence: Secondary | ICD-10-CM

## 2016-05-20 DIAGNOSIS — F411 Generalized anxiety disorder: Secondary | ICD-10-CM

## 2016-05-20 DIAGNOSIS — Z818 Family history of other mental and behavioral disorders: Secondary | ICD-10-CM

## 2016-05-20 DIAGNOSIS — Z811 Family history of alcohol abuse and dependence: Secondary | ICD-10-CM

## 2016-05-20 DIAGNOSIS — F332 Major depressive disorder, recurrent severe without psychotic features: Secondary | ICD-10-CM

## 2016-05-20 DIAGNOSIS — Z8249 Family history of ischemic heart disease and other diseases of the circulatory system: Secondary | ICD-10-CM

## 2016-05-20 DIAGNOSIS — Z803 Family history of malignant neoplasm of breast: Secondary | ICD-10-CM

## 2016-05-20 DIAGNOSIS — F99 Mental disorder, not otherwise specified: Secondary | ICD-10-CM

## 2016-05-20 DIAGNOSIS — F4323 Adjustment disorder with mixed anxiety and depressed mood: Secondary | ICD-10-CM

## 2016-05-20 MED ORDER — NORTRIPTYLINE HCL 50 MG PO CAPS: 50.0000 mg | ORAL_CAPSULE | Freq: Every day | ORAL | 3 refills | Status: DC
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
## 2016-05-25 ENCOUNTER — Other Ambulatory Visit: Payer: Self-pay | Admitting: Internal Medicine

## 2016-05-25 DIAGNOSIS — Z1231 Encounter for screening mammogram for malignant neoplasm of breast: Secondary | ICD-10-CM

## 2016-05-28 ENCOUNTER — Telehealth: Payer: Self-pay | Admitting: Internal Medicine

## 2016-05-28 NOTE — Telephone Encounter (Signed)
My schedule is full and not able to work her in. I would recommend she been seen in Urgent Care or another office with appointment available given the symptoms she is having.

## 2016-05-28 NOTE — Telephone Encounter (Signed)
Patient Name: Meghan Park  DOB: 05/15/1971    Initial Comment Caller has appt on Tuesday but is having heart palpitations. has been under some stress over the last week. Left arm has been going numb starts in her elbow to hand, Nerve in hand that feels like it gets shocked. Gets worse when she is stressed.    Nurse Assessment  Nurse: Stefano GaulStringer, RN, Dwana CurdVera Date/Time (Eastern Time): 05/28/2016 8:48:03 AM  Confirm and document reason for call. If symptomatic, describe symptoms. You must click the next button to save text entered. ---Caller states she has appt on next Tuesday. she is under a lot of stress. She is having heart palpitations. her left arm is going numb from her elbow to her hand. Feels like a "shock". Not numb all the time. No pain.  Has the patient traveled out of the country within the last 30 days? ---Not Applicable  Does the patient have any new or worsening symptoms? ---Yes  Will a triage be completed? ---Yes  Related visit to physician within the last 2 weeks? ---No  Does the PT have any chronic conditions? (i.e. diabetes, asthma, etc.) ---Yes  List chronic conditions. ---depression; diabetes;  Is the patient pregnant or possibly pregnant? (Ask all females between the ages of 5812-55) ---No  Is this a behavioral health or substance abuse call? ---No     Guidelines    Guideline Title Affirmed Question Affirmed Notes  Heart Rate and Heartbeat Questions [1] Heart beating very rapidly (e.g., > 140 / minute) AND [2] not present now (Exception: during exercise)    Final Disposition User   See Physician within 4 Hours (or PCP triage) Stefano GaulStringer, RN, Vera    Comments  no appts available within 4 hrs. Pt does not want to go to urgent care or to another office. Please call pt back regarding appt.   Referrals  GO TO FACILITY REFUSED   Disagree/Comply: Disagree  Disagree/Comply Reason: Disagree with instructions

## 2016-05-31 ENCOUNTER — Telehealth: Payer: Self-pay | Admitting: Endocrinology

## 2016-05-31 ENCOUNTER — Telehealth: Payer: Self-pay

## 2016-05-31 NOTE — Telephone Encounter (Signed)
See message and please advise, Thanks!  

## 2016-05-31 NOTE — Telephone Encounter (Signed)
I contacted the patient and left a voicemail advising of message. Requested a call back to verify the message was received and if she had any further questions.

## 2016-05-31 NOTE — Telephone Encounter (Signed)
pts BS even after insulin this AM is 369, what should she do

## 2016-05-31 NOTE — Telephone Encounter (Signed)
Please verify humalog to 12 units 3 times a day (just before each meal) Please verify no recent steroids. Then increase to 16 units 3 times a day (just before each meal). Call 2 days, to report cbg's.

## 2016-05-31 NOTE — Telephone Encounter (Signed)
Patient called back and confirmed she had not taken any recent oral or injectable steroids. Patient verbalized understanding on increasing the humalog to 16 units 3 times per day and will call back to advise on her blood sugar's if they continue to be high.

## 2016-06-01 ENCOUNTER — Ambulatory Visit (INDEPENDENT_AMBULATORY_CARE_PROVIDER_SITE_OTHER): Payer: Managed Care, Other (non HMO) | Admitting: Internal Medicine

## 2016-06-01 ENCOUNTER — Encounter: Payer: Self-pay | Admitting: Internal Medicine

## 2016-06-01 VITALS — BP 110/80 | HR 100 | Temp 98.3°F | Resp 16 | Ht 62.0 in | Wt 198.8 lb

## 2016-06-01 DIAGNOSIS — Z794 Long term (current) use of insulin: Secondary | ICD-10-CM

## 2016-06-01 DIAGNOSIS — E871 Hypo-osmolality and hyponatremia: Secondary | ICD-10-CM

## 2016-06-01 DIAGNOSIS — D508 Other iron deficiency anemias: Secondary | ICD-10-CM

## 2016-06-01 DIAGNOSIS — D518 Other vitamin B12 deficiency anemias: Secondary | ICD-10-CM | POA: Diagnosis not present

## 2016-06-01 DIAGNOSIS — E083519 Diabetes mellitus due to underlying condition with proliferative diabetic retinopathy with macular edema, unspecified eye: Secondary | ICD-10-CM

## 2016-06-01 DIAGNOSIS — J449 Chronic obstructive pulmonary disease, unspecified: Secondary | ICD-10-CM

## 2016-06-01 DIAGNOSIS — R002 Palpitations: Secondary | ICD-10-CM | POA: Diagnosis not present

## 2016-06-01 DIAGNOSIS — I10 Essential (primary) hypertension: Secondary | ICD-10-CM

## 2016-06-01 DIAGNOSIS — E785 Hyperlipidemia, unspecified: Secondary | ICD-10-CM

## 2016-06-01 NOTE — Patient Instructions (Signed)
Iron Deficiency Anemia, Adult Anemia is a condition in which there are less red blood cells or hemoglobin in the blood than normal. Hemoglobin is the part of red blood cells that carries oxygen. Iron deficiency anemia is anemia caused by too little iron. It is the most common type of anemia. It may leave you tired and short of breath. CAUSES   Lack of iron in the diet.  Poor absorption of iron, as seen with intestinal disorders.  Intestinal bleeding.  Heavy periods. SIGNS AND SYMPTOMS  Mild anemia may not be noticeable. Symptoms may include:  Fatigue.  Headache.  Pale skin.  Weakness.  Tiredness.  Shortness of breath.  Dizziness.  Cold hands and feet.  Fast or irregular heartbeat. DIAGNOSIS  Diagnosis requires a thorough evaluation and physical exam by your health care provider. Blood tests are generally used to confirm iron deficiency anemia. Additional tests may be done to find the underlying cause of your anemia. These may include:  Testing for blood in the stool (fecal occult blood test).  A procedure to see inside the colon and rectum (colonoscopy).  A procedure to see inside the esophagus and stomach (endoscopy). TREATMENT  Iron deficiency anemia is treated by correcting the cause of the deficiency. Treatment may involve:  Adding iron-rich foods to your diet.  Taking iron supplements. Pregnant or breastfeeding women need to take extra iron because their normal diet usually does not provide the required amount.  Taking vitamins. Vitamin C improves the absorption of iron. Your health care provider may recommend that you take your iron tablets with a glass of orange juice or vitamin C supplement.  Medicines to make heavy menstrual flow lighter.  Surgery. HOME CARE INSTRUCTIONS   Take iron as directed by your health care provider.  If you cannot tolerate taking iron supplements by mouth, talk to your health care provider about taking them through a vein  (intravenously) or an injection into a muscle.  For the best iron absorption, iron supplements should be taken on an empty stomach. If you cannot tolerate them on an empty stomach, you may need to take them with food.  Do not drink milk or take antacids at the same time as your iron supplements. Milk and antacids may interfere with the absorption of iron.  Iron supplements can cause constipation. Make sure to include fiber in your diet to prevent constipation. A stool softener may also be recommended.  Take vitamins as directed by your health care provider.  Eat a diet rich in iron. Foods high in iron include liver, lean beef, whole-grain bread, eggs, dried fruit, and dark green leafy vegetables. SEEK IMMEDIATE MEDICAL CARE IF:   You faint. If this happens, do not drive. Call your local emergency services (911 in [place] if no other help is available.  You have chest pain.  You feel nauseous or vomit.  You have severe or increased shortness of breath with activity.  You feel weak.  You have a rapid heartbeat.  You have unexplained sweating.  You become light-headed when getting up from a chair or bed. MAKE SURE YOU:   Understand these instructions.  Will watch your condition.  Will get help right away if you are not doing well or get worse.   This information is not intended to replace advice given to you by your health care provider. Make sure you discuss any questions you have with your health care provider.   Document Released: 07/09/2000 Document Revised: 08/02/2014 Document Reviewed: 03/19/2013 Elsevier   Interactive Patient Education 2016 [employer]  

## 2016-06-01 NOTE — Progress Notes (Signed)
 Subjective:  Patient ID: Meghan Park, female    DOB: 08/27/1970  Age: 45 y.o. MRN: [id number]  CC: Palpitations; Anemia; and Diabetes   HPI Christopher L Christenbury presents for Concerns about anxiety, panic, and episodes of elevated heart rate. She denies chest pain, hemoptysis, diaphoresis, fatigue, near syncope. She does have chronic symptoms from COPD such as nonproductive cough and mild wheezing but she denies shortness of breath. She is not interested in treating the symptoms. She continues to smoke.  Outpatient Medications Prior to Visit  Medication Sig Dispense Refill  . Blood Glucose Monitoring Suppl (ONETOUCH VERIO) W/DEVICE KIT 1 Act by Does not apply route 3 (three) times daily. 2 kit 0  . Cholecalciferol (VITAMIN D-1000 MAX ST) 1000 UNITS tablet Take 1,000 Units by mouth. Reported on 02/02/2016    . diazepam (VALIUM) 10 MG tablet Place 10 mg vaginally every 8 (eight) hours as needed for anxiety.    . dicyclomine (BENTYL) 10 MG capsule Take 10 mg by mouth 4 (four) times daily.    . diphenhydramine-acetaminophen (TYLENOL PM) 25-500 MG TABS tablet Take 1 tablet by mouth at bedtime as needed.    . fluconazole (DIFLUCAN) 150 MG tablet Take 1 tablet (150 mg total) by mouth once. 2 tablet 0  . gabapentin (NEURONTIN) 100 MG capsule Take 1 capsule (100 mg total) by mouth 3 (three) times daily. 90 capsule 11  . glucose blood test strip Use TID 100 each 12  . hydrocortisone cream 0.5 % Apply 1 application topically 2 (two) times daily. 30 g 0  . hyoscyamine (LEVBID) 0.375 MG 12 hr tablet Take 1 tablet (0.375 mg total) by mouth every 12 (twelve) hours as needed. 60 tablet 11  . insulin lispro (HUMALOG KWIKPEN) 100 UNIT/ML KiwkPen Inject 0.12 mLs (12 Units total) into the skin 3 (three) times daily. 15 mL 3  . metoprolol succinate (TOPROL-XL) 25 MG 24 hr tablet Take 1 tablet (25 mg total) by mouth daily. 90 tablet 3  . nortriptyline (PAMELOR) 50 MG capsule Take 1 capsule (50 mg total) by mouth at  bedtime. 30 capsule 3  . ondansetron (ZOFRAN) 4 MG tablet TAKE 1 TABLET(4 MG) BY MOUTH TWICE DAILY 60 tablet 2  . cyclobenzaprine (FLEXERIL) 10 MG tablet TAKE 1 TABLET BY MOUTH AT BEDTIME AS NEEDED FOR MUSCLE SPASMS 30 tablet 0  . traMADol (ULTRAM) 50 MG tablet Take 1 tablet (50 mg total) by mouth every 6 (six) hours as needed. 75 tablet 5   No facility-administered medications prior to visit.     ROS Review of Systems  Constitutional: Negative.  Negative for activity change, appetite change, chills, diaphoresis, fatigue, fever and unexpected weight change.  HENT: Negative.   Eyes: Negative.  Negative for visual disturbance.  Respiratory: Positive for cough and wheezing. Negative for apnea, choking, chest tightness, shortness of breath and stridor.   Cardiovascular: Positive for palpitations. Negative for chest pain and leg swelling.  Gastrointestinal: Negative for abdominal pain, constipation, diarrhea, nausea and vomiting.  Endocrine: Negative.  Negative for polydipsia, polyphagia and polyuria.  Genitourinary: Negative.  Negative for difficulty urinating.  Musculoskeletal: Negative.  Negative for arthralgias, back pain, myalgias and neck pain.  Skin: Negative.  Negative for color change and rash.  Allergic/Immunologic: Negative.   Neurological: Negative for dizziness, tremors, weakness and numbness.  Hematological: Negative for adenopathy. Does not bruise/bleed easily.  Psychiatric/Behavioral: Positive for sleep disturbance. Negative for agitation, behavioral problems, confusion, decreased concentration, dysphoric mood, self-injury and suicidal ideas. The patient is   nervous/anxious.     Objective:  BP 110/80 (BP Location: Right Arm, Patient Position: Sitting, Cuff Size: Large)   Pulse 100   Temp 98.3 F (36.8 C) (Oral)   Resp 16   Ht 5' 2" (1.575 m)   Wt 198 lb 12 oz (90.2 kg)   SpO2 98%   BMI 36.35 kg/m   BP Readings from Last 3 Encounters:  06/01/16 110/80  05/12/16 132/80   04/02/16 (!) 162/102    Wt Readings from Last 3 Encounters:  06/01/16 198 lb 12 oz (90.2 kg)  05/12/16 199 lb (90.3 kg)  04/02/16 201 lb (91.2 kg)    Physical Exam  Constitutional: She is oriented to person, place, and time. No distress.  HENT:  Head: Normocephalic and atraumatic.  Mouth/Throat: Oropharynx is clear and moist. No oropharyngeal exudate.  Eyes: Conjunctivae are normal. Right eye exhibits no discharge. Left eye exhibits no discharge. No scleral icterus.  Neck: Normal range of motion. Neck supple. No JVD present. No tracheal deviation present. No thyromegaly present.  Cardiovascular: Normal rate, regular rhythm, normal heart sounds and intact distal pulses.  Exam reveals no gallop and no friction rub.   No murmur heard. EKG ---  Sinus  Tachycardia  -Short PR syndrome  PRi = 118 -Nonspecific ST depression  -Nondiagnostic.   ABNORMAL   Pulmonary/Chest: Effort normal and breath sounds normal. No stridor. No respiratory distress. She has no wheezes. She has no rales. She exhibits no tenderness.  Abdominal: Soft. Bowel sounds are normal. She exhibits no distension and no mass. There is no tenderness. There is no rebound and no guarding.  Musculoskeletal: Normal range of motion. She exhibits no edema, tenderness or deformity.  Lymphadenopathy:    She has no cervical adenopathy.  Neurological: She is oriented to person, place, and time.  Skin: Skin is warm and dry. No rash noted. She is not diaphoretic. No erythema. No pallor.  Vitals reviewed.   Lab Results  Component Value Date   WBC 7.1 02/02/2016   HGB 11.9 (L) 02/02/2016   HCT 33.9 (L) 02/02/2016   PLT 205.0 02/02/2016   GLUCOSE 150 (H) 02/02/2016   CHOL 274 (H) 06/17/2015   TRIG (H) 06/17/2015    433.0 Triglyceride is over 400; calculations on Lipids are invalid.   HDL 40.40 06/17/2015   LDLDIRECT 168.0 06/17/2015   LDLCALC 96 04/15/2014   ALT 12 02/02/2016   AST 14 02/02/2016   NA 133 (L) 02/02/2016    K 3.8 02/02/2016   CL 100 02/02/2016   CREATININE 0.61 02/02/2016   BUN 6 02/02/2016   CO2 27 02/02/2016   TSH 1.33 06/17/2015   INR 1.18 04/22/2012   HGBA1C 8.1 04/02/2016   MICROALBUR 0.5 07/17/2014    Us Abdomen Complete  Result Date: 07/24/2015 CLINICAL DATA:  Hematuria EXAM: ABDOMEN ULTRASOUND COMPLETE COMPARISON:  11/29/2014 FINDINGS: Gallbladder: Surgically removed Common bile duct: Diameter: 5.1 mm. Liver: No focal lesion identified. Within normal limits in parenchymal echogenicity. IVC: No abnormality visualized. Pancreas: Visualized portion unremarkable. Spleen: Size and appearance within normal limits. Right Kidney: Length: 11.3 cm. Echogenicity within normal limits. No mass or hydronephrosis visualized. Left Kidney: Length: 11.3 cm. Echogenicity within normal limits. No mass or hydronephrosis visualized. Abdominal aorta: No aneurysm visualized. Other findings: None. IMPRESSION: Status post cholecystectomy. No acute abnormality is noted. Electronically Signed   By: Mark  Lukens M.D.   On: 07/24/2015 09:26    Assessment & Plan:   Ashlea was seen today for palpitations, anemia   and diabetes.  Diagnoses and all orders for this visit:  Palpitations- her EKG shows mild tachycardia, there are no alarm features related to the palpitation and I feel certain that this is related to anxiety and panic. I offered her reassurance. -     EKG 12-Lead -     Thyroid Panel With TSH; Future  Essential hypertension- her blood pressure is adequately well controlled. -     Urinalysis, Routine w reflex microscopic (not at [facility]); Future  Other iron deficiency anemia- I will recheck her CBC and her iron level. I will treat if indicated. -     CBC with Differential/Platelet; Future -     IBC panel; Future -     Ferritin; Future  Other vitamin B12 deficiency anemia- I will recheck her CBC and her vitamin B12 level. I will treat if indicated. -     CBC with Differential/Platelet; Future -      Folate; Future -     Vitamin B12; Future  Hyponatremia  Obstructive chronic bronchitis without exacerbation (HCC)- she was encouraged to quit smoking, she is not interested in treating this at this time.  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 -     Lipid panel; Future   I have discontinued Ms. Largo's cyclobenzaprine and traMADol. I am also having her maintain her hyoscyamine, gabapentin, Cholecalciferol, glucose blood, ONETOUCH VERIO, diazepam, diphenhydramine-acetaminophen, dicyclomine, hydrocortisone cream, fluconazole, ondansetron, metoprolol succinate, insulin lispro, nortriptyline, ciprofloxacin, polyethylene glycol, DOCUSATE SODIUM PO, and Sodium Phosphates (FLEET ENEMA RE).  Meds ordered this encounter  Medications  . ciprofloxacin (CIPRO) 500 MG tablet    Sig: Take 500 mg by mouth.  . polyethylene glycol (MIRALAX / GLYCOLAX) packet    Sig: Take 17 g by mouth at bedtime.  . DOCUSATE SODIUM PO    Sig: Take 4 tablets by mouth.  . Sodium Phosphates (FLEET ENEMA RE)    Sig: Place 1 enema rectally once a week.     Follow-up: Return in about 3 months (around 09/01/2016).  Thomas Jones, MD 

## 2016-06-01 NOTE — Progress Notes (Signed)
Pre visit review using our clinic review tool, if applicable. No additional management support is needed unless otherwise documented below in the visit note. 

## 2016-06-08 ENCOUNTER — Ambulatory Visit
Admission: RE | Admit: 2016-06-08 | Discharge: 2016-06-08 | Disposition: A | Discharge status: Home / Self Care | Payer: Managed Care, Other (non HMO) | Source: Ambulatory Visit | Attending: Internal Medicine | Admitting: Internal Medicine

## 2016-06-08 DIAGNOSIS — Z1231 Encounter for screening mammogram for malignant neoplasm of breast: Secondary | ICD-10-CM

## 2016-06-10 LAB — HM MAMMOGRAPHY

## 2016-06-22 ENCOUNTER — Other Ambulatory Visit: Payer: Self-pay | Admitting: Internal Medicine

## 2016-07-08 ENCOUNTER — Ambulatory Visit (INDEPENDENT_AMBULATORY_CARE_PROVIDER_SITE_OTHER): Payer: Managed Care, Other (non HMO) | Admitting: Psychiatry

## 2016-07-08 ENCOUNTER — Encounter (HOSPITAL_COMMUNITY): Payer: Self-pay | Admitting: Psychiatry

## 2016-07-08 VITALS — BP 118/76 | HR 120 | Ht 62.0 in | Wt 201.2 lb

## 2016-07-08 DIAGNOSIS — Z800 Family history of malignant neoplasm of digestive organs: Secondary | ICD-10-CM

## 2016-07-08 DIAGNOSIS — F411 Generalized anxiety disorder: Secondary | ICD-10-CM | POA: Diagnosis not present

## 2016-07-08 DIAGNOSIS — G4701 Insomnia due to medical condition: Secondary | ICD-10-CM

## 2016-07-08 DIAGNOSIS — F332 Major depressive disorder, recurrent severe without psychotic features: Secondary | ICD-10-CM | POA: Diagnosis not present

## 2016-07-08 DIAGNOSIS — F4323 Adjustment disorder with mixed anxiety and depressed mood: Secondary | ICD-10-CM

## 2016-07-08 DIAGNOSIS — F17210 Nicotine dependence, cigarettes, uncomplicated: Secondary | ICD-10-CM

## 2016-07-08 DIAGNOSIS — Z8249 Family history of ischemic heart disease and other diseases of the circulatory system: Secondary | ICD-10-CM

## 2016-07-08 DIAGNOSIS — Z803 Family history of malignant neoplasm of breast: Secondary | ICD-10-CM

## 2016-07-08 DIAGNOSIS — Z8489 Family history of other specified conditions: Secondary | ICD-10-CM

## 2016-07-08 DIAGNOSIS — Z811 Family history of alcohol abuse and dependence: Secondary | ICD-10-CM

## 2016-07-08 DIAGNOSIS — Z8261 Family history of arthritis: Secondary | ICD-10-CM

## 2016-07-08 MED ORDER — LITHIUM CARBONATE ER 300 MG PO TBCR: 300.0000 mg | EXTENDED_RELEASE_TABLET | Freq: Every day | ORAL | 2 refills | Status: DC
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
## 2016-07-23 ENCOUNTER — Telehealth (HOSPITAL_COMMUNITY): Payer: Self-pay

## 2016-07-23 NOTE — Telephone Encounter (Signed)
Patient called and says the Lithium seems to be helping and would like to know if you still want to increase the dose. Patient reports that she still has some bad days, but not as many as before. Please review and advise, thank you

## 2016-07-27 NOTE — Telephone Encounter (Signed)
Called the patient and advised her that there will not be an increase at this time and that she can discuss at her follow up in Feb. Patient voiced her understanding

## 2016-07-27 NOTE — Telephone Encounter (Signed)
No change in dose. Stay at 300mg  per day

## 2016-08-09 ENCOUNTER — Other Ambulatory Visit: Payer: Self-pay | Admitting: Gastroenterology

## 2016-08-12 ENCOUNTER — Ambulatory Visit: Payer: Self-pay | Admitting: Endocrinology

## 2016-08-23 ENCOUNTER — Encounter: Payer: Self-pay | Admitting: Internal Medicine

## 2016-08-31 ENCOUNTER — Ambulatory Visit: Payer: Self-pay | Admitting: Internal Medicine

## 2016-09-02 ENCOUNTER — Ambulatory Visit (INDEPENDENT_AMBULATORY_CARE_PROVIDER_SITE_OTHER): Payer: Managed Care, Other (non HMO) | Admitting: Endocrinology

## 2016-09-02 ENCOUNTER — Encounter: Payer: Self-pay | Admitting: Endocrinology

## 2016-09-02 VITALS — BP 124/80 | HR 90 | Wt 203.8 lb

## 2016-09-02 DIAGNOSIS — E083519 Diabetes mellitus due to underlying condition with proliferative diabetic retinopathy with macular edema, unspecified eye: Secondary | ICD-10-CM | POA: Diagnosis not present

## 2016-09-02 DIAGNOSIS — Z794 Long term (current) use of insulin: Secondary | ICD-10-CM

## 2016-09-02 LAB — POCT GLYCOSYLATED HEMOGLOBIN (HGB A1C): HEMOGLOBIN A1C: 8.1

## 2016-09-02 MED ORDER — INSULIN LISPRO 100 UNIT/ML (KWIKPEN): 20.0000 [IU] | PEN_INJECTOR | Freq: Three times a day (TID) | SUBCUTANEOUS | 3 refills | Status: DC

## 2016-09-02 NOTE — Progress Notes (Signed)
Subjective:    Patient ID: Meghan Park, female    DOB: [birth year], 46 y.o.   MRN: [id number]  HPI Pt returns for f/u of diabetes mellitus:  DM type: Insulin-requiring type 2 Dx'ed: 8466 Complications: polyneuropathy and gastroparesis.  Therapy: insulin since 2015 GDM: never DKA: never Severe hypoglycemia: never.  Pancreatitis: never.  Other: she did not tolerate metformin (nausea) or pioglitizone (edema); She takes multiple daily injections; the pattern of cbg's indicates she does not need basal insulin.  She is considering weight-loss surgery.   Interval history: no cbg record, but states cbg's vary from 170-200's.  It is in general higher as the day goes on.   Past Medical History:  Diagnosis Date  . Adrenal insufficiency ([place])   . Anal fissure   . Anxiety   . Anxiety associated with depression   . Arthritis hip, right  . Cholelithiasis   . Chronic headaches   . Chronic kidney disease   . Cystitis   . Depression   . Diabetes mellitus, type II ([place])   . DM (diabetes mellitus), type 2, uncontrolled ([place])   . Fatty liver   . Gastroparesis   . H pylori ulcer   . Hip pain, right   . HLD (hyperlipidemia)   . Hypertension   . Insomnia   . Nausea   . Pneumonia hx  . Pneumonia     Past Surgical History:  Procedure Laterality Date  . ABDOMINAL HYSTERECTOMY  2012  . CESAREAN SECTION  [id number]   x2  . CHOLECYSTECTOMY  04/13/12  . ERCP  04/24/2012   Procedure: ENDOSCOPIC RETROGRADE CHOLANGIOPANCREATOGRAPHY (ERCP);  Surgeon: Beryle Beams, MD;  Location: [facility];  Service: Endoscopy;  Laterality: N/A;  . ERCP  06/02/2012   Procedure: ENDOSCOPIC RETROGRADE CHOLANGIOPANCREATOGRAPHY (ERCP);  Surgeon: Beryle Beams, MD;  Location: [facility];  Service: Endoscopy;  Laterality: N/A;  . GALLBLADDER SURGERY  2013  . TUBAL LIGATION      Social History   Social History  . Marital status: Married    Spouse name: N/A  . Number of children: 2  . Years of education: N/A    Occupational History  . coordiantor [employer]   Social History Main Topics  . Smoking status: Current Every Day Smoker    Packs/day: 1.00    Years: 23.00    Types: Cigarettes  . Smokeless tobacco: Never Used     Comment: Tobacco info given 02/25/15  . Alcohol use No     Comment: quit 18 yrs ago  . Drug use: No  . Sexual activity: Yes    Birth control/ protection: Other-see comments     Comment: hysterectomy   Other Topics Concern  . 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 Blood Glucose Monitoring Suppl (ONETOUCH VERIO) W/DEVICE KIT 1 Act by Does not apply route 3 (three) times daily. 2 kit 0  . Cholecalciferol (VITAMIN D-1000 MAX ST) 1000 UNITS tablet Take 1,000 Units by mouth. Reported on 02/02/2016    . ciprofloxacin (CIPRO) 500 MG tablet Take 500 mg by mouth.    . diazepam (VALIUM) 10 MG tablet Place 10 mg vaginally every 8 (eight) hours as needed for anxiety.    . dicyclomine (BENTYL) 10 MG capsule Take 10 mg by mouth 4 (four) times  daily.    . diphenhydramine-acetaminophen (TYLENOL PM) 25-500 MG TABS tablet Take 1 tablet by mouth at bedtime as needed.    [name] Kitchen DOCUSATE SODIUM PO Take 4 tablets by mouth.    . fluconazole (DIFLUCAN) 150 MG tablet Take 1 tablet (150 mg total) by mouth once. 2 tablet 0  . gabapentin (NEURONTIN) 100 MG capsule Take 1 capsule (100 mg total) by mouth 3 (three) times daily. 90 capsule 11  . glucose blood test strip Use TID 100 each 12  . hydrocortisone cream 0.5 % Apply 1 application topically 2 (two) times daily. 30 g 0  . hyoscyamine (LEVBID) 0.375 MG 12 hr tablet TAKE 1 TABLET(0.375 MG) BY MOUTH EVERY 12 HOURS AS NEEDED 60 tablet 5  . lithium carbonate (LITHOBID) 300 MG CR tablet Take 1 tablet (300 mg total) by mouth at bedtime. 30 tablet 2  . metoprolol succinate (TOPROL-XL) 25 MG  24 hr tablet Take 1 tablet (25 mg total) by mouth daily. 90 tablet 3  . ondansetron (ZOFRAN) 4 MG tablet TAKE 1 TABLET(4 MG) BY MOUTH TWICE DAILY 60 tablet 2  . polyethylene glycol (MIRALAX / GLYCOLAX) packet Take 17 g by mouth at bedtime.    . Sodium Phosphates (FLEET ENEMA RE) Place 1 enema rectally once a week.     No current facility-administered medications on file prior to visit.     Allergies  Allergen Reactions  . Buprenorphine Hcl Dermatitis    Other reaction(s): Other (See Comments) Severe itching leading to welts and open wounds.  Celesta Gentile [Sitagliptin]     GI upset  . Morphine And Related Dermatitis and Other (See Comments)    Severe itching leading to welts and open wounds.  Nada Libman [Promethazine Hcl] Other (See Comments)    TD  . Promethazine     Other reaction(s): Other (See Comments) TD  . Reglan [Metoclopramide] Other (See Comments)    Other reaction(s): Other (See Comments), Tremor (intolerance) Tremors, jittery And headache Tremors, jittery  . Farxiga [Dapagliflozin]     yeast    Family History  Problem Relation Age of Onset  . Anxiety disorder Mother   . Hyperlipidemia Mother   . Irritable bowel syndrome Mother   . Arthritis Father   . COPD Father   . Heart disease Father   . Heart disease Maternal Grandfather   . Congestive Heart Failure Maternal Grandmother   . Heart disease Maternal Grandmother   . Heart disease Paternal Grandfather   . Breast cancer Paternal Grandmother     great  . Anxiety disorder Son     social anxiety  . Diabetes Maternal Aunt   . Alcohol abuse Maternal Uncle   . Uterine cancer Maternal Aunt     great  . Colon cancer Neg Hx   . Other Neg Hx     hyponatremia    BP 124/80 (BP Location: Left Arm, Patient Position: Sitting, Cuff Size: Normal)   Pulse 90   Wt 203 lb 12.8 oz (92.4 kg)   SpO2 97%   BMI 37.28 kg/m    Review of Systems She denies hypoglycemia.      Objective:   Physical Exam VITAL SIGNS:   See vs page GENERAL: no distress Pulses: drsalis pedis intact bilat.   MSK: no deformity of the feet CV: no leg edema Skin:  no ulcer on the feet.  normal color and temp on the feet. Neuro: sensation is intact to touch on the feet, but decreased from normal.  Assessment & Plan:  Insulin-requiring type 2 DM, with gastroparesis: she needs increased rx.   Patient is advised the following: Patient Instructions  Please increase humalog to 20 units 3 times a day (just before each meal).  check your blood sugar twice a day.  vary the time of day when you check, between before the 3 meals, and at bedtime.  also check if you have symptoms of your blood sugar being too high or too low.  please keep a record of the readings and bring it to your next appointment here (or you can bring the meter itself).  You can write it on any piece of paper.  please call us sooner if your blood sugar goes below 70, or if you have a lot of readings over 200.  Please come back for a follow-up appointment in 3 months.

## 2016-09-02 NOTE — Patient Instructions (Addendum)
Please increase humalog to 20 units 3 times a day (just before each meal).  check your blood sugar twice a day.  vary the time of day when you check, between before the 3 meals, and at bedtime.  also check if you have symptoms of your blood sugar being too high or too low.  please keep a record of the readings and bring it to your next appointment here (or you can bring the meter itself).  You can write it on any piece of paper.  please call us sooner if your blood sugar goes below 70, or if you have a lot of readings over 200.  Please come back for a follow-up appointment in 3 months.

## 2016-09-02 NOTE — Progress Notes (Signed)
Pre visit review using our clinic review tool, if applicable. No additional management support is needed unless otherwise documented below in the visit note. 

## 2016-09-09 ENCOUNTER — Ambulatory Visit (HOSPITAL_COMMUNITY): Payer: Self-pay | Admitting: Psychiatry

## 2016-09-29 ENCOUNTER — Other Ambulatory Visit (HOSPITAL_COMMUNITY): Payer: Self-pay | Admitting: Psychiatry

## 2016-09-29 DIAGNOSIS — F4323 Adjustment disorder with mixed anxiety and depressed mood: Secondary | ICD-10-CM

## 2016-09-29 DIAGNOSIS — F332 Major depressive disorder, recurrent severe without psychotic features: Secondary | ICD-10-CM

## 2016-10-12 ENCOUNTER — Other Ambulatory Visit (INDEPENDENT_AMBULATORY_CARE_PROVIDER_SITE_OTHER): Payer: Managed Care, Other (non HMO)

## 2016-10-12 ENCOUNTER — Encounter: Payer: Self-pay | Admitting: Internal Medicine

## 2016-10-12 ENCOUNTER — Ambulatory Visit (INDEPENDENT_AMBULATORY_CARE_PROVIDER_SITE_OTHER)
Admission: RE | Admit: 2016-10-12 | Discharge: 2016-10-12 | Disposition: A | Discharge status: Home / Self Care | Payer: Managed Care, Other (non HMO) | Source: Ambulatory Visit | Attending: Internal Medicine | Admitting: Internal Medicine

## 2016-10-12 ENCOUNTER — Ambulatory Visit (INDEPENDENT_AMBULATORY_CARE_PROVIDER_SITE_OTHER): Payer: Managed Care, Other (non HMO) | Admitting: Internal Medicine

## 2016-10-12 VITALS — BP 124/80 | HR 96 | Temp 98.3°F | Resp 16 | Ht 62.0 in | Wt 198.0 lb

## 2016-10-12 DIAGNOSIS — D508 Other iron deficiency anemias: Secondary | ICD-10-CM

## 2016-10-12 DIAGNOSIS — E781 Pure hyperglyceridemia: Secondary | ICD-10-CM | POA: Diagnosis not present

## 2016-10-12 DIAGNOSIS — E871 Hypo-osmolality and hyponatremia: Secondary | ICD-10-CM | POA: Diagnosis not present

## 2016-10-12 DIAGNOSIS — R7989 Other specified abnormal findings of blood chemistry: Secondary | ICD-10-CM

## 2016-10-12 DIAGNOSIS — E118 Type 2 diabetes mellitus with unspecified complications: Secondary | ICD-10-CM

## 2016-10-12 DIAGNOSIS — I10 Essential (primary) hypertension: Secondary | ICD-10-CM

## 2016-10-12 DIAGNOSIS — Z794 Long term (current) use of insulin: Secondary | ICD-10-CM

## 2016-10-12 DIAGNOSIS — M5030 Other cervical disc degeneration, unspecified cervical region: Secondary | ICD-10-CM | POA: Insufficient documentation

## 2016-10-12 DIAGNOSIS — E1165 Type 2 diabetes mellitus with hyperglycemia: Secondary | ICD-10-CM | POA: Diagnosis not present

## 2016-10-12 DIAGNOSIS — D518 Other vitamin B12 deficiency anemias: Secondary | ICD-10-CM

## 2016-10-12 DIAGNOSIS — E785 Hyperlipidemia, unspecified: Secondary | ICD-10-CM

## 2016-10-12 DIAGNOSIS — M542 Cervicalgia: Secondary | ICD-10-CM | POA: Diagnosis not present

## 2016-10-12 DIAGNOSIS — IMO0002 Reserved for concepts with insufficient information to code with codable children: Secondary | ICD-10-CM

## 2016-10-12 LAB — CBC WITH DIFFERENTIAL/PLATELET
BASOS ABS: 0.1 10*3/uL (ref 0.0–0.1)
BASOS PCT: 0.8 % (ref 0.0–3.0)
EOS ABS: 0.2 10*3/uL (ref 0.0–0.7)
EOS PCT: 2.2 % (ref 0.0–5.0)
HEMATOCRIT: 37.3 % (ref 36.0–46.0)
HEMOGLOBIN: 13 g/dL (ref 12.0–15.0)
Lymphocytes Relative: 27.6 % (ref 12.0–46.0)
Lymphs Abs: 2.2 10*3/uL (ref 0.7–4.0)
MCHC: 34.8 g/dL (ref 30.0–36.0)
MCV: 93.3 fl (ref 78.0–100.0)
Monocytes Absolute: 0.5 10*3/uL (ref 0.1–1.0)
Monocytes Relative: 6.4 % (ref 3.0–12.0)
NEUTROS ABS: 5 10*3/uL (ref 1.4–7.7)
Neutrophils Relative %: 63 % (ref 43.0–77.0)
Platelets: 197 10*3/uL (ref 150.0–400.0)
RBC: 4 Mil/uL (ref 3.87–5.11)
RDW: 12.7 % (ref 11.5–15.5)
WBC: 8 10*3/uL (ref 4.0–10.5)

## 2016-10-12 LAB — COMPREHENSIVE METABOLIC PANEL
ALT: 10 U/L (ref 0–35)
AST: 12 U/L (ref 0–37)
Albumin: 4.2 g/dL (ref 3.5–5.2)
Alkaline Phosphatase: 64 U/L (ref 39–117)
BUN: 7 mg/dL (ref 6–23)
CHLORIDE: 99 meq/L (ref 96–112)
CO2: 25 meq/L (ref 19–32)
Calcium: 9.6 mg/dL (ref 8.4–10.5)
Creatinine, Ser: 0.61 mg/dL (ref 0.40–1.20)
GFR: 112.39 mL/min (ref >60.00)
GLUCOSE: 320 mg/dL — AB (ref 70–99)
POTASSIUM: 3.6 meq/L (ref 3.5–5.1)
SODIUM: 132 meq/L — AB (ref 135–145)
Total Bilirubin: 0.6 mg/dL (ref 0.2–1.2)
Total Protein: 7.4 g/dL (ref 6.0–8.3)

## 2016-10-12 LAB — LIPID PANEL
Cholesterol: 174 mg/dL (ref 0–200)
HDL: 40.6 mg/dL (ref >39.00)
NONHDL: 133.53
Total CHOL/HDL Ratio: 4
Triglycerides: 252 mg/dL — ABNORMAL HIGH (ref 0.0–149.0)
VLDL: 50.4 mg/dL — AB (ref 0.0–40.0)

## 2016-10-12 LAB — URINALYSIS, ROUTINE W REFLEX MICROSCOPIC
Bilirubin Urine: NEGATIVE
HGB URINE DIPSTICK: NEGATIVE
KETONES UR: NEGATIVE
Leukocytes, UA: NEGATIVE
NITRITE: NEGATIVE
RBC / HPF: NONE SEEN (ref 0–?)
TOTAL PROTEIN, URINE-UPE24: NEGATIVE
UROBILINOGEN UA: 0.2 (ref 0.0–1.0)
Urine Glucose: >=1000 — AB
WBC UA: NONE SEEN (ref 0–?)
pH: 6.5 (ref 5.0–8.0)

## 2016-10-12 LAB — MICROALBUMIN / CREATININE URINE RATIO
Creatinine,U: 39 mg/dL
MICROALB/CREAT RATIO: 1.8 mg/g (ref 0.0–30.0)
Microalb, Ur: <0.7 mg/dL (ref 0.0–1.9)

## 2016-10-12 LAB — HEMOGLOBIN A1C: HEMOGLOBIN A1C: 8.1 % — AB (ref 4.6–6.5)

## 2016-10-12 LAB — VITAMIN B12: Vitamin B-12: 291 pg/mL (ref 211–911)

## 2016-10-12 LAB — FERRITIN: Ferritin: 29.7 ng/mL (ref 10.0–291.0)

## 2016-10-12 LAB — FOLATE: FOLATE: 17.7 ng/mL (ref >5.9)

## 2016-10-12 LAB — LDL CHOLESTEROL, DIRECT: Direct LDL: 99 mg/dL

## 2016-10-12 LAB — IBC PANEL
IRON: 95 ug/dL (ref 42–145)
Saturation Ratios: 22 % (ref 20.0–50.0)
Transferrin: 308 mg/dL (ref 212.0–360.0)

## 2016-10-12 LAB — TSH: TSH: 1.24 u[IU]/mL (ref 0.35–4.50)

## 2016-10-12 MED ORDER — TRAMADOL HCL 50 MG PO TABS: 50.0000 mg | ORAL_TABLET | Freq: Three times a day (TID) | ORAL | 0 refills | Status: DC | PRN

## 2016-10-12 NOTE — Patient Instructions (Signed)
Acute Torticollis Torticollis is a condition in which the muscles of the neck tighten (contract) abnormally, causing the neck to twist and the head to move into an unnatural position. Torticollis that develops suddenly is called acute torticollis. If torticollis becomes chronic and is left untreated, the face and neck can become deformed. CAUSES This condition may be caused by:  Sleeping in an awkward position (common).  Extending or twisting the neck muscles beyond their normal position.  Infection. In some cases, the cause may not be known. SYMPTOMS Symptoms of this condition include:  An unnatural position of the head.  Neck pain.  A limited ability to move the neck.  Twisting of the neck to one side. DIAGNOSIS This condition is diagnosed with a physical exam. You may also have imaging tests, such as an X-ray, CT scan, or MRI. TREATMENT Treatment for this condition involves trying to relax the neck muscles. It may include:  Medicines or shots.  Physical therapy.  Surgery. This may be done in severe cases. HOME CARE INSTRUCTIONS  Take medicines only as directed by your health care provider.  Do stretching exercises and massage your neck as directed by your health care provider.  Keep all follow-up visits as directed by your health care provider. This is important. SEEK MEDICAL CARE IF:  You develop a fever. SEEK IMMEDIATE MEDICAL CARE IF:  You develop difficulty breathing.  You develop noisy breathing (stridor).  You start drooling.  You have trouble swallowing or have pain with swallowing.  You develop numbness or weakness in your hands or feet.  You have changes in your speech, understanding, or vision.  Your pain gets worse. This information is not intended to replace advice given to you by your health care provider. Make sure you discuss any questions you have with your health care provider. Document Released: 07/09/2000 Document Revised: 11/03/2015  Document Reviewed: 07/08/2014 Elsevier Interactive Patient Education  2017 [employer] Inc.  

## 2016-10-12 NOTE — Progress Notes (Signed)
Subjective:  Patient ID: Meghan Park, female    DOB: 1970-11-16  Age: 46 y.o. MRN: [id number]  CC: Hyperlipidemia; Hypertension; Diabetes; and Anemia   HPI Leily L Mcelwee presents for A 10 day history of right-sided neck pain. She woke up one day with a sensation of a crick in her neck with no preceding trauma or injury. She describes it as a stiffness that increases when she turns her head to the right side. She has tried Tylenol without much symptom relief. She can't take NSAIDs due to gastrointestinal illness. She says the pain does not radiate into her arms and she denies numbness, weakness, tingling.  Outpatient Medications Prior to Visit  Medication Sig Dispense Refill  . Blood Glucose Monitoring Suppl (ONETOUCH VERIO) W/DEVICE KIT 1 Act by Does not apply route 3 (three) times daily. 2 kit 0  . Cholecalciferol (VITAMIN D-1000 MAX ST) 1000 UNITS tablet Take 1,000 Units by mouth. Reported on 02/02/2016    . ciprofloxacin (CIPRO) 500 MG tablet Take 500 mg by mouth.    . diazepam (VALIUM) 10 MG tablet Place 10 mg vaginally every 8 (eight) hours as needed for anxiety.    . dicyclomine (BENTYL) 10 MG capsule Take 10 mg by mouth 4 (four) times daily.    . diphenhydramine-acetaminophen (TYLENOL PM) 25-500 MG TABS tablet Take 1 tablet by mouth at bedtime as needed.    [name] Kitchen DOCUSATE SODIUM PO Take 4 tablets by mouth.    . fluconazole (DIFLUCAN) 150 MG tablet Take 1 tablet (150 mg total) by mouth once. 2 tablet 0  . gabapentin (NEURONTIN) 100 MG capsule Take 1 capsule (100 mg total) by mouth 3 (three) times daily. 90 capsule 11  . glucose blood test strip Use TID 100 each 12  . hydrocortisone cream 0.5 % Apply 1 application topically 2 (two) times daily. 30 g 0  . hyoscyamine (LEVBID) 0.375 MG 12 hr tablet TAKE 1 TABLET(0.375 MG) BY MOUTH EVERY 12 HOURS AS NEEDED 60 tablet 5  . insulin lispro (HUMALOG KWIKPEN) 100 UNIT/ML KiwkPen Inject 0.2 mLs (20 Units total) into the skin 3 (three) times daily.  30 mL 3  . lithium carbonate (LITHOBID) 300 MG CR tablet TAKE 1 TABLET BY MOUTH AT BEDTIME 30 tablet 0  . metoprolol succinate (TOPROL-XL) 25 MG 24 hr tablet Take 1 tablet (25 mg total) by mouth daily. 90 tablet 3  . ondansetron (ZOFRAN) 4 MG tablet TAKE 1 TABLET(4 MG) BY MOUTH TWICE DAILY 60 tablet 2  . polyethylene glycol (MIRALAX / GLYCOLAX) packet Take 17 g by mouth at bedtime.    . Sodium Phosphates (FLEET ENEMA RE) Place 1 enema rectally once a week.     No facility-administered medications prior to visit.     ROS Review of Systems  Constitutional: Negative.  Negative for fatigue.  HENT: Negative.   Eyes: Negative.   Respiratory: Negative.  Negative for cough, chest tightness, shortness of breath and wheezing.   Cardiovascular: Negative for chest pain, palpitations and leg swelling.  Gastrointestinal: Negative for abdominal pain, constipation, diarrhea, nausea and vomiting.  Endocrine: Negative.   Genitourinary: Negative.  Negative for difficulty urinating.  Musculoskeletal: Positive for neck pain and neck stiffness. Negative for arthralgias, back pain and joint swelling.  Skin: Negative.   Allergic/Immunologic: Negative.   Neurological: Negative.  Negative for dizziness, tremors, syncope, weakness, numbness and headaches.  Hematological: Negative for adenopathy. Does not bruise/bleed easily.  Psychiatric/Behavioral: Negative.     Objective:  BP 124/80 (BP  Location: Left Arm, Patient Position: Sitting, Cuff Size: Large)   Pulse 96   Temp 98.3 F (36.8 C) (Oral)   Resp 16   Ht _0  (1.575 m)   Wt 198 lb 0.6 oz (89.8 kg)   SpO2 96%   BMI 36.22 kg/m   BP Readings from Last 3 Encounters:  10/12/16 124/80  09/02/16 124/80  06/01/16 110/80    Wt Readings from Last 3 Encounters:  10/12/16 198 lb 0.6 oz (89.8 kg)  09/02/16 203 lb 12.8 oz (92.4 kg)  06/01/16 198 lb 12 oz (90.2 kg)    Physical Exam  Constitutional: She is oriented to person, place, and time. No  distress.  HENT:  Mouth/Throat: Oropharynx is clear and moist. No oropharyngeal exudate.  Eyes: Conjunctivae are normal. Right eye exhibits no discharge. Left eye exhibits no discharge. No scleral icterus.  Neck: Normal range of motion. Neck supple. No JVD present. No spinous process tenderness and no muscular tenderness present. Carotid bruit is not present. No neck rigidity. No tracheal deviation, no erythema and normal range of motion present. No thyroid mass and no thyromegaly present.  Cardiovascular: Normal rate, normal heart sounds and intact distal pulses.  Exam reveals no gallop and no friction rub.   No murmur heard. Pulmonary/Chest: Effort normal and breath sounds normal. No stridor. No respiratory distress. She has no wheezes. She has no rales. She exhibits no tenderness.  Abdominal: Soft. Bowel sounds are normal. She exhibits no distension and no mass. There is no tenderness. There is no rebound and no guarding.  Musculoskeletal: Normal range of motion. She exhibits no edema, tenderness or deformity.       Cervical back: She exhibits normal range of motion, no tenderness, no bony tenderness, no swelling, no edema, no deformity, no laceration, no pain and no spasm.  Lymphadenopathy:    She has no cervical adenopathy.  Neurological: She is oriented to person, place, and time. She has normal strength. She displays no atrophy, no tremor and normal reflexes. No cranial nerve deficit or sensory deficit. She exhibits normal muscle tone. She displays a negative Romberg sign. She displays no seizure activity. Coordination and gait normal. She displays no Babinski's sign on the left side.  Reflex Scores:      Tricep reflexes are 1+ on the right side and 1+ on the left side.      Bicep reflexes are 1+ on the right side and 1+ on the left side.      Brachioradialis reflexes are 1+ on the right side and 1+ on the left side.      Patellar reflexes are 1+ on the right side and 1+ on the left side.       Achilles reflexes are 1+ on the right side and 1+ on the left side. Skin: Skin is warm and dry. No rash noted. She is not diaphoretic. No erythema. No pallor.  Vitals reviewed.   Lab Results  Component Value Date   WBC 8.0 10/12/2016   HGB 13.0 10/12/2016   HCT 37.3 10/12/2016   PLT 197.0 10/12/2016   GLUCOSE 320 (H) 10/12/2016   CHOL 174 10/12/2016   TRIG 252.0 (H) 10/12/2016   HDL 40.60 10/12/2016   LDLDIRECT 99.0 10/12/2016   LDLCALC 96 04/15/2014   ALT 10 10/12/2016   AST 12 10/12/2016   NA 132 (L) 10/12/2016   K 3.6 10/12/2016   CL 99 10/12/2016   CREATININE 0.61 10/12/2016   BUN 7 10/12/2016   CO2  25 10/12/2016   TSH 1.24 10/12/2016   INR 1.18 04/22/2012   HGBA1C 8.1 (H) 10/12/2016   MICROALBUR <0.7 10/12/2016    Mm Digital Screening Bilateral  Result Date: 06/08/2016 CLINICAL DATA:  Screening. EXAM: DIGITAL SCREENING BILATERAL MAMMOGRAM WITH CAD COMPARISON:  Previous exam(s). ACR Breast Density Category b: There are scattered areas of fibroglandular density. FINDINGS: There are no findings suspicious for malignancy. Images were processed with CAD. IMPRESSION: No mammographic evidence of malignancy. A result letter of this screening mammogram will be mailed directly to the patient. RECOMMENDATION: Screening mammogram in one year. (Code:SM-B-01Y) BI-RADS CATEGORY  1: Negative. Electronically Signed   By: Lajean Manes M.D.   On: 06/10/2016 16:30    Assessment & Plan:   Tieshia was seen today for hyperlipidemia, hypertension, diabetes and anemia.  Diagnoses and all orders for this visit:  Essential hypertension- her blood pressure is adequately well controlled, electrolytes and renal function are stable. -     Comprehensive metabolic panel; Future -     Urinalysis, Routine w reflex microscopic; Future  Uncontrolled type 2 diabetes mellitus with complication, with long-term current use of insulin ([place])- we will continue to follow-up with endocrinology regarding  this -     Hemoglobin A1c; Future -     Microalbumin / creatinine urine ratio; Future  Other iron deficiency anemia- The anemia has resolved and her iron level is normal, I've asked her to continue iron replacement therapy -     CBC with Differential/Platelet; Future -     IBC panel; Future -     Ferritin; Future  Other vitamin B12 deficiency anemia- she is not anemic but her B12 level is trending downward so Avastin to start high-dose oral B12 replacement therapy. -     CBC with Differential/Platelet; Future -     Vitamin B12; Future -     Folate; Future -     cyanocobalamin 2000 MCG tablet; Take 1 tablet (2,000 mcg total) by mouth daily.  Hyponatremia- she has mild dilutional hyponatremia secondary to hyperglycemia, I've asked her to be more diligent about controlling her blood sugar -     Comprehensive metabolic panel; Future  Hyperlipidemia with target LDL less than 100- she has not achieved her LDL goal but is also not willing to take a statin -     Lipid panel; Future -     TSH; Future  Pure hyperglyceridemia- her triglycerides are not high enough to require medical therapy -     Lipid panel; Future  Neck pain on right side- plain film shows minimal degenerative disease, will try tramadol for symptom relief. -     DG Cervical Spine Complete; Future -     traMADol (ULTRAM) 50 MG tablet; Take 1 tablet (50 mg total) by mouth every 8 (eight) hours as needed for moderate pain.   I am having Ms. Derise start on traMADol and cyanocobalamin. I am also having her maintain her gabapentin, Cholecalciferol, glucose blood, ONETOUCH VERIO, diazepam, diphenhydramine-acetaminophen, dicyclomine, hydrocortisone cream, fluconazole, ondansetron, metoprolol succinate, ciprofloxacin, polyethylene glycol, DOCUSATE SODIUM PO, Sodium Phosphates (FLEET ENEMA RE), hyoscyamine, insulin lispro, and lithium carbonate.  Meds ordered this encounter  Medications  . traMADol (ULTRAM) 50 MG tablet    Sig: Take  1 tablet (50 mg total) by mouth every 8 (eight) hours as needed for moderate pain.    Dispense:  30 tablet    Refill:  0  . cyanocobalamin 2000 MCG tablet    Sig: Take 1 tablet (  2,000 mcg total) by mouth daily.    Dispense:  90 tablet    Refill:  3     Follow-up: Return in about 3 weeks (around 11/02/2016).  Scarlette Calico, MD

## 2016-10-12 NOTE — Progress Notes (Signed)
Pre visit review using our clinic review tool, if applicable. No additional management support is needed unless otherwise documented below in the visit note. 

## 2016-10-13 ENCOUNTER — Encounter: Payer: Self-pay | Admitting: Internal Medicine

## 2016-10-13 MED ORDER — CYANOCOBALAMIN 2000 MCG PO TABS: 2000.0000 ug | ORAL_TABLET | Freq: Every day | ORAL | 3 refills | Status: DC

## 2016-10-18 ENCOUNTER — Other Ambulatory Visit: Payer: Self-pay | Admitting: Internal Medicine

## 2016-10-18 DIAGNOSIS — M5030 Other cervical disc degeneration, unspecified cervical region: Secondary | ICD-10-CM

## 2016-10-21 ENCOUNTER — Encounter (HOSPITAL_COMMUNITY): Payer: Self-pay | Admitting: Psychiatry

## 2016-10-21 ENCOUNTER — Ambulatory Visit (INDEPENDENT_AMBULATORY_CARE_PROVIDER_SITE_OTHER): Payer: 59 | Admitting: Psychiatry

## 2016-10-21 VITALS — BP 130/80 | HR 101 | Ht 62.5 in | Wt 195.0 lb

## 2016-10-21 DIAGNOSIS — F332 Major depressive disorder, recurrent severe without psychotic features: Secondary | ICD-10-CM | POA: Diagnosis not present

## 2016-10-21 DIAGNOSIS — F411 Generalized anxiety disorder: Secondary | ICD-10-CM

## 2016-10-21 DIAGNOSIS — F17210 Nicotine dependence, cigarettes, uncomplicated: Secondary | ICD-10-CM

## 2016-10-21 DIAGNOSIS — F4323 Adjustment disorder with mixed anxiety and depressed mood: Secondary | ICD-10-CM | POA: Diagnosis not present

## 2016-10-21 DIAGNOSIS — Z79899 Other long term (current) drug therapy: Secondary | ICD-10-CM

## 2016-10-21 DIAGNOSIS — Z794 Long term (current) use of insulin: Secondary | ICD-10-CM | POA: Diagnosis not present

## 2016-10-21 DIAGNOSIS — G4700 Insomnia, unspecified: Secondary | ICD-10-CM

## 2016-10-21 MED ORDER — HYDROXYZINE HCL 10 MG PO TABS: 10.0000 mg | ORAL_TABLET | Freq: Two times a day (BID) | ORAL | 1 refills | Status: DC | PRN

## 2016-10-21 MED ORDER — LITHIUM CARBONATE ER 450 MG PO TBCR: 450.0000 mg | EXTENDED_RELEASE_TABLET | Freq: Two times a day (BID) | ORAL | 1 refills | Status: DC

## 2016-10-21 NOTE — Progress Notes (Signed)
[facility] MD/PA/NP OP Progress Note  10/21/2016 8:04 AM Meghan Park  MRN:  [id number]  Chief Complaint:  Chief Complaint    Follow-up     HPI: Pt states her anxiety has changed. She wakes up near panic attack like symptoms most mornings. She has also become very fearful while out. She used to be very social and outgoing.  States sleep is minorly improved. She is getting about 5-6 hrs/night. Energy remains low.   Depression is worse. She is having a lot of grief over the loss of her father. She feels she needs therapy because meds alone are not helping. She is depressed everyday and most days the level is 6/10. She is no motivation to do anything, anehdonia, crying spells and worthlessness. Denies SI/HI.  Taking meds as prescribed and denies SE. States Lithium has helped her be calmed and others have told she is less aggressive.      Visit Diagnosis:    ICD-9-CM ICD-10-CM   1. GAD (generalized anxiety disorder) 300.02 F41.1 hydrOXYzine (ATARAX/VISTARIL) 10 MG tablet  2. Severe episode of recurrent major depressive disorder, without psychotic features ([place]) 296.33 F33.2 lithium carbonate (ESKALITH) 450 MG CR tablet  3. Adjustment disorder with mixed anxiety and depressed mood 309.28 F43.23     Past Psychiatric History: see H&P  Past Medical History:  Past Medical History:  Diagnosis Date  . Adrenal insufficiency ([place])   . Anal fissure   . Anxiety   . Anxiety associated with depression   . Arthritis hip, right  . Cholelithiasis   . Chronic headaches   . Chronic kidney disease   . Cystitis   . Depression   . Diabetes mellitus, type II ([place])   . DM (diabetes mellitus), type 2, uncontrolled ([place])   . Fatty liver   . Gastroparesis   . H pylori ulcer   . Hip pain, right   . HLD (hyperlipidemia)   . Hypertension   . Insomnia   . Nausea   . Pneumonia hx  . Pneumonia     Past Surgical History:  Procedure Laterality Date  . ABDOMINAL HYSTERECTOMY  2012  . CESAREAN SECTION   [id number]   x2  . CHOLECYSTECTOMY  04/13/12  . ERCP  04/24/2012   Procedure: ENDOSCOPIC RETROGRADE CHOLANGIOPANCREATOGRAPHY (ERCP);  Surgeon: Beryle Beams, MD;  Location: [facility];  Service: Endoscopy;  Laterality: N/A;  . ERCP  06/02/2012   Procedure: ENDOSCOPIC RETROGRADE CHOLANGIOPANCREATOGRAPHY (ERCP);  Surgeon: Beryle Beams, MD;  Location: [facility];  Service: Endoscopy;  Laterality: N/A;  . GALLBLADDER SURGERY  2013  . TUBAL LIGATION      Family Psychiatric History:  Family History  Problem Relation Age of Onset  . Anxiety disorder Mother   . Hyperlipidemia Mother   . Irritable bowel syndrome Mother   . Arthritis Father   . COPD Father   . Heart disease Father   . Heart disease Maternal Grandfather   . Congestive Heart Failure Maternal Grandmother   . Heart disease Maternal Grandmother   . Heart disease Paternal Grandfather   . Breast cancer Paternal Grandmother     great  . Anxiety disorder Son     social anxiety  . Diabetes Maternal Aunt   . Alcohol abuse Maternal Uncle   . Uterine cancer Maternal Aunt     great  . Colon cancer Neg Hx   . Other Neg Hx     hyponatremia    Social History:  Social History   Social  History  . Marital status: Married    Spouse name: N/A  . Number of children: 2  . Years of education: N/A   Occupational History  . coordiantor [employer]   Social History Main Topics  . Smoking status: Current Every Day Smoker    Packs/day: 1.00    Years: 23.00    Types: Cigarettes  . Smokeless tobacco: Never Used     Comment: Tobacco info given 02/25/15  . Alcohol use No     Comment: quit 18 yrs ago  . Drug use: No  . Sexual activity: Yes    Birth control/ protection: Other-see comments     Comment: hysterectomy   Other Topics Concern  . None   Social History Narrative   married   Employment: data entry   Statistician daily    Allergies:  Allergies  Allergen Reactions  . Buprenorphine Hcl Dermatitis     Other reaction(s): Other (See Comments) Severe itching leading to welts and open wounds.  Celesta Gentile [Sitagliptin]     GI upset  . Morphine And Related Dermatitis and Other (See Comments)    Severe itching leading to welts and open wounds.  Nada Libman [Promethazine Hcl] Other (See Comments)    TD  . Promethazine     Other reaction(s): Other (See Comments) TD  . Reglan [Metoclopramide] Other (See Comments)    Other reaction(s): Other (See Comments), Tremor (intolerance) Tremors, jittery And headache Tremors, jittery  . Farxiga [Dapagliflozin]     yeast    Metabolic Disorder Labs: Lab Results  Component Value Date   HGBA1C 8.1 (H) 10/12/2016   No results found for: PROLACTIN Lab Results  Component Value Date   CHOL 174 10/12/2016   TRIG 252.0 (H) 10/12/2016   HDL 40.60 10/12/2016   CHOLHDL 4 10/12/2016   VLDL 50.4 (H) 10/12/2016   LDLCALC 96 04/15/2014   LDLCALC 107 (H) 04/06/2013     Current Medications: Current Outpatient Prescriptions  Medication Sig Dispense Refill  . Blood Glucose Monitoring Suppl (ONETOUCH VERIO) W/DEVICE KIT 1 Act by Does not apply route 3 (three) times daily. 2 kit 0  . Cholecalciferol (VITAMIN D-1000 MAX ST) 1000 UNITS tablet Take 1,000 Units by mouth. Reported on 02/02/2016    . cyanocobalamin 2000 MCG tablet Take 1 tablet (2,000 mcg total) by mouth daily. 90 tablet 3  . diazepam (VALIUM) 10 MG tablet Place 10 mg vaginally every 8 (eight) hours as needed for anxiety.    . dicyclomine (BENTYL) 10 MG capsule Take 10 mg by mouth 4 (four) times daily.    . diphenhydramine-acetaminophen (TYLENOL PM) 25-500 MG TABS tablet Take 1 tablet by mouth at bedtime as needed.    [name] Kitchen DOCUSATE SODIUM PO Take 4 tablets by mouth.    . fluconazole (DIFLUCAN) 150 MG tablet Take 1 tablet (150 mg total) by mouth once. 2 tablet 0  . gabapentin (NEURONTIN) 100 MG capsule Take 1 capsule (100 mg total) by mouth 3 (three) times daily. 90 capsule 11  . glucose blood test  strip Use TID 100 each 12  . hydrocortisone cream 0.5 % Apply 1 application topically 2 (two) times daily. 30 g 0  . hyoscyamine (LEVBID) 0.375 MG 12 hr tablet TAKE 1 TABLET(0.375 MG) BY MOUTH EVERY 12 HOURS AS NEEDED 60 tablet 5  . insulin lispro (HUMALOG KWIKPEN) 100 UNIT/ML KiwkPen Inject 0.2 mLs (20 Units total) into the skin 3 (three) times daily. 30 mL 3  . lithium  carbonate (LITHOBID) 300 MG CR tablet TAKE 1 TABLET BY MOUTH AT BEDTIME 30 tablet 0  . metoprolol succinate (TOPROL-XL) 25 MG 24 hr tablet Take 1 tablet (25 mg total) by mouth daily. 90 tablet 3  . ondansetron (ZOFRAN) 4 MG tablet TAKE 1 TABLET(4 MG) BY MOUTH TWICE DAILY 60 tablet 2  . SUCRALFATE PO Take by mouth.    . traMADol (ULTRAM) 50 MG tablet Take 1 tablet (50 mg total) by mouth every 8 (eight) hours as needed for moderate pain. 30 tablet 0   No current facility-administered medications for this visit.       Musculoskeletal: Strength & Muscle Tone: within normal limits Gait & Station: normal Patient leans: N/A  Psychiatric Specialty Exam: Review of Systems  Musculoskeletal: Positive for back pain, joint pain and neck pain. Negative for falls.  Neurological: Positive for headaches. Negative for dizziness, tremors, sensory change, seizures and loss of consciousness.  Psychiatric/Behavioral: Positive for depression. Negative for hallucinations, substance abuse and suicidal ideas. The patient is nervous/anxious. The patient does not have insomnia.     Blood pressure 130/80, pulse (!) 101, height 5' 2.5" (1.588 m), weight 195 lb (88.5 kg).Body mass index is 35.1 kg/m.  General Appearance: Fairly Groomed  Eye Contact:  Good  Speech:  Clear and Coherent and Normal Rate  Volume:  Normal  Mood:  Anxious and Depressed  Affect:  Congruent, Depressed and Tearful  Thought Process:  Goal Directed and Descriptions of Associations: Intact  Orientation:  Full (Time, Place, and Person)  Thought Content: WDL   Suicidal  Thoughts:  No  Homicidal Thoughts:  No  Memory:  Immediate;   Good Recent;   Good Remote;   Good  Judgement:  Fair  Insight:  Fair  Psychomotor Activity:  Normal  Concentration:  Concentration: Good and Attention Span: Good  Recall:  Good  Fund of Knowledge: Good  Language: Good  Akathisia:  No  Handed:  Right  AIMS (if indicated):  n/a  Assets:  Communication Skills Desire for Improvement  ADL's:  Intact  Cognition: WNL  Sleep:  fair     Treatment Plan Summary:Medication management and Plan see below  Assessment: MDD- recurrent, moderate; GAD; Insomnia   Medication management with supportive therapy. Risks/benefits and SE of the medication discussed. Pt verbalized understanding and verbal consent obtained for treatment.  Affirm with the patient that the medications are taken as ordered. Patient expressed understanding of how their medications were to be used.   The risk of un-intended pregnancy is low based on the fact that pt reports she had a hysterectomy. Pt is aware that these meds carry a teratogenic risk. Pt will discuss plan of action if she does or plans to become pregnant in the future.   Meds: increase Lithobid to 478m po BID for worsening depression Start trial of Vistaril 117mpo BID prn anxiety for worsening anxiety  Labs: reviewed labs done 10/12/16 Na 132, glu 320, trigylcerides 252, CBC WNL, TSH WNL   Therapy: brief supportive therapy provided. Discussed psychosocial stressors in detail.     Consultations:  Referred therapy   Pt denies SI and is at an acute low risk for suicide. Patient told to call clinic if any problems occur. Patient advised to go to ER if they should develop SI/HI, side effects, or if symptoms worsen. Has crisis numbers to call if needed. Pt verbalized understanding.  F/up in 1 months or sooner if needed   SaCharlcie CradleMD 10/21/2016, 8:04 AM

## 2016-10-26 ENCOUNTER — Encounter: Payer: Self-pay | Admitting: Internal Medicine

## 2016-10-26 NOTE — Telephone Encounter (Signed)
FYI

## 2016-10-28 ENCOUNTER — Encounter (HOSPITAL_COMMUNITY): Payer: Self-pay | Admitting: Licensed Clinical Social Worker

## 2016-10-28 ENCOUNTER — Ambulatory Visit (INDEPENDENT_AMBULATORY_CARE_PROVIDER_SITE_OTHER): Payer: 59 | Admitting: Licensed Clinical Social Worker

## 2016-10-28 DIAGNOSIS — F411 Generalized anxiety disorder: Secondary | ICD-10-CM | POA: Diagnosis not present

## 2016-10-28 DIAGNOSIS — F331 Major depressive disorder, recurrent, moderate: Secondary | ICD-10-CM | POA: Diagnosis not present
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
## 2016-11-15 ENCOUNTER — Encounter (HOSPITAL_COMMUNITY): Payer: Self-pay | Admitting: Licensed Clinical Social Worker

## 2016-11-15 ENCOUNTER — Ambulatory Visit (INDEPENDENT_AMBULATORY_CARE_PROVIDER_SITE_OTHER): Payer: 59 | Admitting: Licensed Clinical Social Worker

## 2016-11-15 DIAGNOSIS — F331 Major depressive disorder, recurrent, moderate: Secondary | ICD-10-CM

## 2016-11-15 DIAGNOSIS — F411 Generalized anxiety disorder: Secondary | ICD-10-CM
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
## 2016-11-18 ENCOUNTER — Encounter (HOSPITAL_COMMUNITY): Payer: Self-pay | Admitting: Licensed Clinical Social Worker

## 2016-11-22 ENCOUNTER — Other Ambulatory Visit: Payer: Self-pay | Admitting: Pain Medicine

## 2016-11-22 DIAGNOSIS — M542 Cervicalgia: Secondary | ICD-10-CM

## 2016-11-25 ENCOUNTER — Ambulatory Visit (HOSPITAL_COMMUNITY): Payer: 59 | Admitting: Licensed Clinical Social Worker

## 2016-11-29 ENCOUNTER — Ambulatory Visit
Admission: RE | Admit: 2016-11-29 | Discharge: 2016-11-29 | Disposition: A | Discharge status: Home / Self Care | Payer: 59 | Source: Ambulatory Visit | Attending: Pain Medicine | Admitting: Pain Medicine

## 2016-11-29 DIAGNOSIS — M542 Cervicalgia: Secondary | ICD-10-CM

## 2016-12-01 ENCOUNTER — Ambulatory Visit (INDEPENDENT_AMBULATORY_CARE_PROVIDER_SITE_OTHER): Payer: 59 | Admitting: Endocrinology

## 2016-12-01 ENCOUNTER — Encounter: Payer: Self-pay | Admitting: Endocrinology

## 2016-12-01 VITALS — BP 108/74 | HR 88 | Ht 63.0 in | Wt 194.0 lb

## 2016-12-01 DIAGNOSIS — E1165 Type 2 diabetes mellitus with hyperglycemia: Secondary | ICD-10-CM | POA: Diagnosis not present

## 2016-12-01 DIAGNOSIS — E118 Type 2 diabetes mellitus with unspecified complications: Secondary | ICD-10-CM

## 2016-12-01 DIAGNOSIS — Z794 Long term (current) use of insulin: Secondary | ICD-10-CM | POA: Diagnosis not present

## 2016-12-01 DIAGNOSIS — IMO0002 Reserved for concepts with insufficient information to code with codable children: Secondary | ICD-10-CM

## 2016-12-01 MED ORDER — INSULIN LISPRO 100 UNIT/ML (KWIKPEN): 25.0000 [IU] | PEN_INJECTOR | Freq: Three times a day (TID) | SUBCUTANEOUS | 3 refills | Status: AC

## 2016-12-01 NOTE — Patient Instructions (Addendum)
Please increase humalog to 25 units 3 times a day (just before each meal).  check your blood sugar twice a day.  vary the time of day when you check, between before the 3 meals, and at bedtime.  also check if you have symptoms of your blood sugar being too high or too low.  please keep a record of the readings and bring it to your next appointment here (or you can bring the meter itself).  You can write it on any piece of paper.  please call us sooner if your blood sugar goes below 70, or if you have a lot of readings over 200.  Please come back for a follow-up appointment in 2-3 months.

## 2016-12-01 NOTE — Progress Notes (Signed)
Subjective:    Patient ID: Meghan Park, female    DOB: [birth year], 46 y.o.   MRN: [id number]  HPI Pt returns for f/u of diabetes mellitus:  DM type: Insulin-requiring type 2 Dx'ed: 9211 Complications: polyneuropathy and gastroparesis.  Therapy: insulin since 2015 GDM: never DKA: never Severe hypoglycemia: never.  Pancreatitis: never.  Other: she did not tolerate metformin (nausea) or pioglitizone (edema); She takes multiple daily injections; the pattern of cbg's indicates she does not need basal insulin.  She is considering weight-loss surgery.   Interval history: no cbg record, but states cbg's vary from 150-200's.  It is in general higher as the day goes on.  She says she does not miss the insulin.   Past Medical History:  Diagnosis Date  . Adrenal insufficiency ([place])   . Anal fissure   . Anxiety   . Anxiety associated with depression   . Arthritis hip, right  . Cholelithiasis   . Chronic headaches   . Chronic kidney disease   . Cystitis   . Depression   . Diabetes mellitus, type II ([place])   . DM (diabetes mellitus), type 2, uncontrolled ([place])   . Fatty liver   . Gastroparesis   . H pylori ulcer   . Hip pain, right   . HLD (hyperlipidemia)   . Hypertension   . Insomnia   . Nausea   . Pneumonia hx  . Pneumonia     Past Surgical History:  Procedure Laterality Date  . ABDOMINAL HYSTERECTOMY  2012  . CESAREAN SECTION  [id number]   x2  . CHOLECYSTECTOMY  04/13/12  . ERCP  04/24/2012   Procedure: ENDOSCOPIC RETROGRADE CHOLANGIOPANCREATOGRAPHY (ERCP);  Surgeon: Beryle Beams, MD;  Location: [facility];  Service: Endoscopy;  Laterality: N/A;  . ERCP  06/02/2012   Procedure: ENDOSCOPIC RETROGRADE CHOLANGIOPANCREATOGRAPHY (ERCP);  Surgeon: Beryle Beams, MD;  Location: [facility];  Service: Endoscopy;  Laterality: N/A;  . GALLBLADDER SURGERY  2013  . TUBAL LIGATION      Social History   Social History  . Marital status: Married    Spouse name: N/A  . Number  of children: 2  . Years of education: N/A   Occupational History  . coordiantor [employer]   Social History Main Topics  . Smoking status: Current Every Day Smoker    Packs/day: 1.00    Years: 23.00    Types: Cigarettes  . Smokeless tobacco: Never Used     Comment: Tobacco info given 02/25/15  . Alcohol use No     Comment: quit 18 yrs ago  . Drug use: No  . Sexual activity: Yes    Birth control/ protection: Other-see comments     Comment: hysterectomy   Other Topics Concern  . 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 Blood Glucose Monitoring Suppl (ONETOUCH VERIO) W/DEVICE KIT 1 Act by Does not apply route 3 (three) times daily. 2 kit 0  . Cholecalciferol (VITAMIN D-1000 MAX ST) 1000 UNITS tablet Take 1,000 Units by mouth. Reported on 02/02/2016    . cyanocobalamin 2000 MCG tablet Take 1 tablet (2,000 mcg total) by mouth daily. 90 tablet 3  . diazepam (VALIUM) 10 MG tablet Place 10 mg vaginally every 8 (eight) hours as needed for anxiety.    [name] Kitchen  diphenhydramine-acetaminophen (TYLENOL PM) 25-500 MG TABS tablet Take 1 tablet by mouth at bedtime as needed.    [name] Kitchen DOCUSATE SODIUM PO Take 4 tablets by mouth.    . fluconazole (DIFLUCAN) 150 MG tablet Take 1 tablet (150 mg total) by mouth once. 2 tablet 0  . gabapentin (NEURONTIN) 100 MG capsule Take 1 capsule (100 mg total) by mouth 3 (three) times daily. 90 capsule 11  . glucose blood test strip Use TID 100 each 12  . hydrocortisone cream 0.5 % Apply 1 application topically 2 (two) times daily. 30 g 0  . hydrOXYzine (ATARAX/VISTARIL) 10 MG tablet Take 1 tablet (10 mg total) by mouth 2 (two) times daily as needed. 60 tablet 1  . hyoscyamine (LEVBID) 0.375 MG 12 hr tablet TAKE 1 TABLET(0.375 MG) BY MOUTH EVERY 12 HOURS AS NEEDED 60 tablet 5  . lithium carbonate (ESKALITH) 450  MG CR tablet Take 1 tablet (450 mg total) by mouth 2 (two) times daily. 60 tablet 1  . metoprolol succinate (TOPROL-XL) 25 MG 24 hr tablet Take 1 tablet (25 mg total) by mouth daily. 90 tablet 3  . ondansetron (ZOFRAN) 4 MG tablet TAKE 1 TABLET(4 MG) BY MOUTH TWICE DAILY 60 tablet 2  . SUCRALFATE PO Take by mouth.    . dicyclomine (BENTYL) 10 MG capsule Take 10 mg by mouth 4 (four) times daily.    . traMADol (ULTRAM) 50 MG tablet Take 1 tablet (50 mg total) by mouth every 8 (eight) hours as needed for moderate pain. (Patient not taking: Reported on 12/01/2016) 30 tablet 0   No current facility-administered medications on file prior to visit.     Allergies  Allergen Reactions  . Buprenorphine Hcl Dermatitis    Other reaction(s): Other (See Comments) Severe itching leading to welts and open wounds.  Celesta Gentile [Sitagliptin]     GI upset  . Morphine And Related Dermatitis and Other (See Comments)    Severe itching leading to welts and open wounds.  Nada Libman [Promethazine Hcl] Other (See Comments)    TD  . Promethazine     Other reaction(s): Other (See Comments) TD  . Reglan [Metoclopramide] Other (See Comments)    Other reaction(s): Other (See Comments), Tremor (intolerance) Tremors, jittery And headache Tremors, jittery  . Farxiga [Dapagliflozin]     yeast    Family History  Problem Relation Age of Onset  . Anxiety disorder Mother   . Hyperlipidemia Mother   . Irritable bowel syndrome Mother   . Arthritis Father   . COPD Father   . Heart disease Father   . Heart disease Maternal Grandfather   . Congestive Heart Failure Maternal Grandmother   . Heart disease Maternal Grandmother   . Heart disease Paternal Grandfather   . Breast cancer Paternal Grandmother        great  . Anxiety disorder Son        social anxiety  . Diabetes Maternal Aunt   . Alcohol abuse Maternal Uncle   . Uterine cancer Maternal Aunt        great  . Colon cancer Neg Hx   . Other Neg Hx         hyponatremia    BP 108/74   Pulse 88   Ht _0  (1.6 m)   Wt 194 lb (88 kg)   BMI 34.37 kg/m   Review of Systems She denies hypoglycemia    Objective:   Physical Exam VITAL SIGNS:  See vs page GENERAL:  no distress Pulses: drsalis pedis intact bilat.   MSK: no deformity of the feet CV: no leg edema Skin:  no ulcer on the feet.  normal color and temp on the feet. Neuro: sensation is intact to touch on the feet, but decreased from normal.       Assessment & Plan:  Insulin-requiring type 2 DM, with gastroparesis: she needs increased rx.  Patient Instructions  Please increase humalog to 25 units 3 times a day (just before each meal).  check your blood sugar twice a day.  vary the time of day when you check, between before the 3 meals, and at bedtime.  also check if you have symptoms of your blood sugar being too high or too low.  please keep a record of the readings and bring it to your next appointment here (or you can bring the meter itself).  You can write it on any piece of paper.  please call us sooner if your blood sugar goes below 70, or if you have a lot of readings over 200.  Please come back for a follow-up appointment in 2-3 months.

## 2016-12-02 ENCOUNTER — Ambulatory Visit
Admission: RE | Admit: 2016-12-02 | Discharge: 2016-12-02 | Disposition: A | Discharge status: Home / Self Care | Payer: 59 | Source: Ambulatory Visit | Attending: Pain Medicine | Admitting: Pain Medicine

## 2016-12-02 ENCOUNTER — Ambulatory Visit (HOSPITAL_COMMUNITY): Payer: Self-pay | Admitting: Psychiatry

## 2016-12-02 ENCOUNTER — Other Ambulatory Visit: Payer: Self-pay | Admitting: Pain Medicine

## 2016-12-02 DIAGNOSIS — M25551 Pain in right hip: Secondary | ICD-10-CM

## 2016-12-08 ENCOUNTER — Encounter: Payer: Self-pay | Admitting: Internal Medicine

## 2016-12-08 DIAGNOSIS — M5030 Other cervical disc degeneration, unspecified cervical region: Secondary | ICD-10-CM

## 2016-12-14 ENCOUNTER — Encounter (HOSPITAL_COMMUNITY): Payer: Self-pay | Admitting: Licensed Clinical Social Worker

## 2016-12-14 ENCOUNTER — Ambulatory Visit (INDEPENDENT_AMBULATORY_CARE_PROVIDER_SITE_OTHER): Payer: 59 | Admitting: Licensed Clinical Social Worker

## 2016-12-14 DIAGNOSIS — F331 Major depressive disorder, recurrent, moderate: Secondary | ICD-10-CM | POA: Diagnosis not present

## 2016-12-14 DIAGNOSIS — F411 Generalized anxiety disorder: Secondary | ICD-10-CM
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
## 2016-12-21 ENCOUNTER — Encounter: Payer: Self-pay | Admitting: Internal Medicine

## 2016-12-29 ENCOUNTER — Ambulatory Visit (HOSPITAL_COMMUNITY): Payer: Self-pay | Admitting: Licensed Clinical Social Worker

## 2016-12-30 ENCOUNTER — Other Ambulatory Visit (HOSPITAL_COMMUNITY): Payer: Self-pay | Admitting: Psychiatry

## 2016-12-30 DIAGNOSIS — F332 Major depressive disorder, recurrent severe without psychotic features: Secondary | ICD-10-CM

## 2016-12-30 DIAGNOSIS — F411 Generalized anxiety disorder: Secondary | ICD-10-CM

## 2017-01-11 ENCOUNTER — Other Ambulatory Visit: Payer: Self-pay | Admitting: Neurosurgery

## 2017-01-12 ENCOUNTER — Ambulatory Visit (HOSPITAL_COMMUNITY): Payer: Self-pay | Admitting: Licensed Clinical Social Worker

## 2017-01-20 ENCOUNTER — Encounter (HOSPITAL_COMMUNITY): Payer: Self-pay

## 2017-01-20 ENCOUNTER — Encounter (HOSPITAL_COMMUNITY)
Admission: RE | Admit: 2017-01-20 | Discharge: 2017-01-20 | Disposition: A | Discharge status: Home / Self Care | Payer: 59 | Source: Ambulatory Visit | Attending: Neurosurgery | Admitting: Neurosurgery

## 2017-01-20 DIAGNOSIS — M4722 Other spondylosis with radiculopathy, cervical region: Secondary | ICD-10-CM | POA: Diagnosis not present

## 2017-01-20 DIAGNOSIS — Z01812 Encounter for preprocedural laboratory examination: Secondary | ICD-10-CM | POA: Insufficient documentation

## 2017-01-20 HISTORY — DX: Personal history of other diseases of the digestive system: Z87.19

## 2017-01-20 HISTORY — DX: Fibromyalgia: M79.7

## 2017-01-20 HISTORY — DX: Myoneural disorder, unspecified: G70.9

## 2017-01-20 LAB — CBC
HCT: 37.7 % (ref 36.0–46.0)
Hemoglobin: 12.5 g/dL (ref 12.0–15.0)
MCH: 31.6 pg (ref 26.0–34.0)
MCHC: 33.2 g/dL (ref 30.0–36.0)
MCV: 95.2 fL (ref 78.0–100.0)
PLATELETS: 221 10*3/uL (ref 150–400)
RBC: 3.96 MIL/uL (ref 3.87–5.11)
RDW: 13.2 % (ref 11.5–15.5)
WBC: 7.3 10*3/uL (ref 4.0–10.5)

## 2017-01-20 LAB — BASIC METABOLIC PANEL
Anion gap: 8 (ref 5–15)
BUN: 6 mg/dL (ref 6–20)
CHLORIDE: 103 mmol/L (ref 101–111)
CO2: 23 mmol/L (ref 22–32)
CREATININE: 0.71 mg/dL (ref 0.44–1.00)
Calcium: 9 mg/dL (ref 8.9–10.3)
GFR calc Af Amer: >60 mL/min (ref >60)
GFR calc non Af Amer: >60 mL/min (ref >60)
GLUCOSE: 198 mg/dL — AB (ref 65–99)
Potassium: 3.4 mmol/L — ABNORMAL LOW (ref 3.5–5.1)
SODIUM: 134 mmol/L — AB (ref 135–145)

## 2017-01-20 LAB — TYPE AND SCREEN
ABO/RH(D): A POS
ANTIBODY SCREEN: NEGATIVE

## 2017-01-20 LAB — GLUCOSE, CAPILLARY: Glucose-Capillary: 225 mg/dL — ABNORMAL HIGH (ref 65–99)

## 2017-01-20 LAB — SURGICAL PCR SCREEN
MRSA, PCR: NEGATIVE
Staphylococcus aureus: NEGATIVE

## 2017-01-20 LAB — ABO/RH: ABO/RH(D): A POS

## 2017-01-20 NOTE — Pre-Procedure Instructions (Signed)
Meghan Park  01/20/2017      [facility] [phone] - [place], [place] - [address] AT [facility] [address] [name] [id number] Phone: [phone] Fax: [phone]  [facility] [phone] - [place], [place] - [address] AT [facility] [address] [name] [id number] Phone: [phone] Fax: [phone]    Your procedure is scheduled on 01/27/2017- Thursday .  Report to [name] Admitting at 10:30 A.M.  Call this number if you have problems the morning of surgery:  [phone]   Remember:  Do not eat food or drink liquids after midnight.  Wednesday    Take these medicines the morning of surgery with A SIP OF WATER :  Tylenol if needed, Hydroxizine, Hyoscyamine, Zofran, Lithium     How to Manage Your Diabetes Before and After Surgery  Why is it important to control my blood sugar before and after surgery? . Improving blood sugar levels before and after surgery helps healing and can limit problems. . A way of improving blood sugar control is eating a healthy diet by: o  Eating less sugar and carbohydrates o  Increasing activity/exercise o  Talking with your doctor about reaching your blood sugar goals . High blood sugars (greater than 180 mg/dL) can raise your risk of infections and slow your recovery, so you will need to focus on controlling your diabetes during the weeks before surgery. . Make sure that the doctor who takes care of your diabetes knows about your planned surgery including the date and location.  How do I manage my blood sugar before surgery? . Check your blood sugar at least 4 times a day, starting 2 days before surgery, to make sure that the level is not too high or low. o Check your blood sugar the morning of your surgery when you wake up and every 2 hours until you get to the Short Stay unit. . If your blood sugar is less than 70 mg/dL, you will need to treat for  low blood sugar: o Do not take insulin. o Treat a low blood sugar (less than 70 mg/dL) with  cup of clear juice (cranberry or apple), 4 glucose tablets, OR glucose gel. o Recheck blood sugar in 15 minutes after treatment (to make sure it is greater than 70 mg/dL). If your blood sugar is not greater than 70 mg/dL on recheck, call [phone] for further instructions. . Report your blood sugar to the short stay nurse when you get to Short Stay.  . If you are admitted to the hospital after surgery: o Your blood sugar will be checked by the staff and you will probably be given insulin after surgery (instead of oral diabetes medicines) to make sure you have good blood sugar levels. o The goal for blood sugar control after surgery is 80-180 mg/dL.      WHAT DO I DO ABOUT MY DIABETES MEDICATION?   [name] Kitchen.   . THE EVENING BEFORE SURGERY, take ____usual _______ units of ___________insulin.       . Meghan Park.  MORNING OF SURGERY: . If your BLOOD SUGAR  is greater than 220 mg/dL, you may take  of your sliding scale (correction) dose of insulin.-  (12 units)  Other Instructions: NO MORNING INSULIN day of surgery - unless blood sugar greater than 220       Special Instructions: [facility] - Preparing for Surgery  Before surgery, you can play an important role.  Because skin is not sterile, your skin needs to be as free of germs as possible.  You can reduce the number of germs on you skin by washing with CHG (chlorahexidine gluconate) soap before surgery.  CHG is an antiseptic cleaner which kills germs and bonds with the skin to continue killing germs even after washing.  Please DO NOT use if you have an allergy to CHG or antibacterial soaps.  If your skin becomes reddened/irritated stop using the CHG and inform your nurse when you arrive at Short Stay.  Do not shave (including legs and underarms) for at least 48 hours prior to the first CHG shower.  You may shave your face.  Please follow these  instructions carefully:   1.  Shower with CHG Soap the night before surgery and the  morning of Surgery.  2.  If you choose to wash your hair, wash your hair first as usual with your  normal shampoo.  3.  After you shampoo, rinse your hair and body thoroughly to remove the  Shampoo.  4.  Use CHG as you would any other liquid soap.  You can apply chg directly to the skin and wash gently with scrungie or a clean washcloth.  5.  Apply the CHG Soap to your body ONLY FROM THE NECK DOWN.    Do not use on open wounds or open sores.  Avoid contact with your eyes, ears, mouth and genitals (private parts).  Wash genitals (private parts)   with your normal soap.  6.  Wash thoroughly, paying special attention to the area where your surgery will be performed.  7.  Thoroughly rinse your body with warm water from the neck down.  8.  DO NOT shower/wash with your normal soap after using and rinsing off   the CHG Soap.  9.  Pat yourself dry with a clean towel.            10.  Wear clean pajamas.            11.  Place clean sheets on your bed the night of your first shower and do not sleep with pets.  Day of Surgery  Do not apply any lotions/deodorants the morning of surgery.  Please wear clean clothes to the hospital/surgery center.    Do not  wear jewelry, make-up or nail polish.   Do not wear lotions, powders, or perfumes, or deoderant.   Do not shave 48 hours prior to surgery.     Do not bring valuables to the hospital.   [facility] is not responsible for any belongings or valuables.  Contacts, dentures or bridgework may not be worn into surgery.  Leave your suitcase in the car.  After surgery it may be brought to your room.  For patients admitted to the hospital, discharge time will be determined by your treatment team.  Patients discharged the day of surgery will not be allowed to drive home.   Name and phone number of your driver:   Family   Please read over the following fact sheets that  you were given. Pain Booklet, Coughing and Deep Breathing, MRSA Information and Surgical Site Infection Prevention

## 2017-01-20 NOTE — Progress Notes (Signed)
Pt. Followed by Dr. Everardo AllEllison for BP & diabetes. Pt. Reports that since her BP has recently been wnl she has held metoprolol per Dr. George HughEllison's instruction. Pt. Also see Dr. Leitha Schuller. Jones with [facility].  Pt. Denies ever have any advanced cardiac studies. EKG was done in Nov. 2017 for palpitations at PCP office,   Pt. Denies palpitations  since that time & reports that she has no new complaints.

## 2017-01-27 ENCOUNTER — Encounter (HOSPITAL_COMMUNITY)
Admission: RE | Disposition: A | Discharge status: Home / Self Care | Payer: Self-pay | Source: Ambulatory Visit | Attending: Neurosurgery

## 2017-01-27 ENCOUNTER — Ambulatory Visit (HOSPITAL_COMMUNITY): Payer: Self-pay | Admitting: Licensed Clinical Social Worker

## 2017-01-27 ENCOUNTER — Ambulatory Visit (HOSPITAL_COMMUNITY): Payer: Self-pay | Admitting: Psychiatry

## 2017-01-27 ENCOUNTER — Ambulatory Visit (HOSPITAL_COMMUNITY)
Admission: RE | Admit: 2017-01-27 | Discharge: 2017-01-27 | Disposition: A | Discharge status: Home / Self Care | Payer: 59 | Source: Ambulatory Visit | Attending: Neurosurgery | Admitting: Neurosurgery

## 2017-01-27 ENCOUNTER — Encounter (HOSPITAL_COMMUNITY): Payer: Self-pay | Admitting: *Deleted

## 2017-01-27 ENCOUNTER — Ambulatory Visit (HOSPITAL_COMMUNITY): Payer: 59 | Admitting: Certified Registered"

## 2017-01-27 ENCOUNTER — Other Ambulatory Visit (HOSPITAL_COMMUNITY): Payer: Self-pay | Admitting: Psychiatry

## 2017-01-27 ENCOUNTER — Ambulatory Visit (HOSPITAL_COMMUNITY): Payer: 59

## 2017-01-27 ENCOUNTER — Ambulatory Visit (HOSPITAL_COMMUNITY): Payer: 59 | Admitting: Emergency Medicine

## 2017-01-27 DIAGNOSIS — Z794 Long term (current) use of insulin: Secondary | ICD-10-CM | POA: Diagnosis not present

## 2017-01-27 DIAGNOSIS — Z419 Encounter for procedure for purposes other than remedying health state, unspecified: Secondary | ICD-10-CM

## 2017-01-27 DIAGNOSIS — F419 Anxiety disorder, unspecified: Secondary | ICD-10-CM | POA: Diagnosis not present

## 2017-01-27 DIAGNOSIS — E669 Obesity, unspecified: Secondary | ICD-10-CM | POA: Insufficient documentation

## 2017-01-27 DIAGNOSIS — E109 Type 1 diabetes mellitus without complications: Secondary | ICD-10-CM | POA: Insufficient documentation

## 2017-01-27 DIAGNOSIS — M4722 Other spondylosis with radiculopathy, cervical region: Secondary | ICD-10-CM | POA: Diagnosis present

## 2017-01-27 DIAGNOSIS — F329 Major depressive disorder, single episode, unspecified: Secondary | ICD-10-CM | POA: Insufficient documentation

## 2017-01-27 DIAGNOSIS — I10 Essential (primary) hypertension: Secondary | ICD-10-CM | POA: Insufficient documentation

## 2017-01-27 DIAGNOSIS — F411 Generalized anxiety disorder: Secondary | ICD-10-CM

## 2017-01-27 DIAGNOSIS — F332 Major depressive disorder, recurrent severe without psychotic features: Secondary | ICD-10-CM

## 2017-01-27 DIAGNOSIS — Z79899 Other long term (current) drug therapy: Secondary | ICD-10-CM | POA: Insufficient documentation

## 2017-01-27 DIAGNOSIS — Z6834 Body mass index (BMI) 34.0-34.9, adult: Secondary | ICD-10-CM | POA: Diagnosis not present

## 2017-01-27 HISTORY — PX: ANTERIOR CERVICAL DECOMP/DISCECTOMY FUSION: SHX1161

## 2017-01-27 LAB — GLUCOSE, CAPILLARY
Glucose-Capillary: 117 mg/dL — ABNORMAL HIGH (ref 65–99)
Glucose-Capillary: 73 mg/dL (ref 65–99)
Glucose-Capillary: 164 mg/dL — ABNORMAL HIGH (ref 65–99)

## 2017-01-27 SURGERY — ANTERIOR CERVICAL DECOMPRESSION/DISCECTOMY FUSION 2 LEVELS
Anesthesia: General | Site: Spine Cervical

## 2017-01-27 MED ORDER — VITAMIN B-12 1000 MCG PO TABS: 2000.0000 ug | ORAL_TABLET | Freq: Every day | ORAL | Status: DC

## 2017-01-27 MED ORDER — HEMOSTATIC AGENTS (NO CHARGE) OPTIME
TOPICAL | Status: DC | PRN
  Administered 2017-01-27 (×2): 1 via TOPICAL

## 2017-01-27 MED ORDER — HYOSCYAMINE SULFATE ER 0.375 MG PO TB12
0.3750 mg | ORAL_TABLET | Freq: Two times a day (BID) | ORAL | Status: DC
  Filled 2017-01-27: qty 1

## 2017-01-27 MED ORDER — FENTANYL CITRATE (PF) 100 MCG/2ML IJ SOLN
INTRAMUSCULAR | Status: DC | PRN
  Administered 2017-01-27: 150 ug via INTRAVENOUS
  Administered 2017-01-27 (×2): 50 ug via INTRAVENOUS

## 2017-01-27 MED ORDER — MEPERIDINE HCL 25 MG/ML IJ SOLN
6.2500 mg | INTRAMUSCULAR | Status: DC | PRN
Start: 2017-01-27 — End: 2017-01-27

## 2017-01-27 MED ORDER — ONDANSETRON HCL 4 MG/2ML IJ SOLN
INTRAMUSCULAR | Status: AC
  Filled 2017-01-27: qty 2

## 2017-01-27 MED ORDER — HYDROMORPHONE HCL 1 MG/ML IJ SOLN
INTRAMUSCULAR | Status: AC
  Filled 2017-01-27: qty 1

## 2017-01-27 MED ORDER — 0.9 % SODIUM CHLORIDE (POUR BTL) OPTIME
TOPICAL | Status: DC | PRN
  Administered 2017-01-27: 1000 mL

## 2017-01-27 MED ORDER — MENTHOL 3 MG MT LOZG: 1.0000 | LOZENGE | OROMUCOSAL | Status: DC | PRN

## 2017-01-27 MED ORDER — ONDANSETRON HCL 4 MG/2ML IJ SOLN
INTRAMUSCULAR | Status: DC | PRN
  Administered 2017-01-27 (×2): 4 mg via INTRAVENOUS

## 2017-01-27 MED ORDER — CELECOXIB 200 MG PO CAPS: 200.0000 mg | ORAL_CAPSULE | Freq: Two times a day (BID) | ORAL | Status: DC

## 2017-01-27 MED ORDER — GABAPENTIN 100 MG PO CAPS: 100.0000 mg | ORAL_CAPSULE | Freq: Three times a day (TID) | ORAL | Status: DC

## 2017-01-27 MED ORDER — ZOLPIDEM TARTRATE 5 MG PO TABS: 5.0000 mg | ORAL_TABLET | Freq: Every evening | ORAL | Status: DC | PRN

## 2017-01-27 MED ORDER — ONDANSETRON HCL 4 MG/2ML IJ SOLN: 4.0000 mg | Freq: Once | INTRAMUSCULAR | Status: DC | PRN

## 2017-01-27 MED ORDER — MIDAZOLAM HCL 5 MG/5ML IJ SOLN
INTRAMUSCULAR | Status: DC | PRN
  Administered 2017-01-27: 2 mg via INTRAVENOUS

## 2017-01-27 MED ORDER — ROCURONIUM BROMIDE 100 MG/10ML IV SOLN
INTRAVENOUS | Status: DC | PRN
  Administered 2017-01-27: 50 mg via INTRAVENOUS

## 2017-01-27 MED ORDER — HYDROCODONE-ACETAMINOPHEN 5-325 MG PO TABS: 1.0000 | ORAL_TABLET | Freq: Four times a day (QID) | ORAL | 0 refills | Status: DC | PRN

## 2017-01-27 MED ORDER — PROPOFOL 10 MG/ML IV BOLUS
INTRAVENOUS | Status: AC
  Filled 2017-01-27: qty 20

## 2017-01-27 MED ORDER — PHENYLEPHRINE 40 MCG/ML (10ML) SYRINGE FOR IV PUSH (FOR BLOOD PRESSURE SUPPORT)
PREFILLED_SYRINGE | INTRAVENOUS | Status: AC
  Filled 2017-01-27: qty 10

## 2017-01-27 MED ORDER — CHLORHEXIDINE GLUCONATE CLOTH 2 % EX PADS: 6.0000 | MEDICATED_PAD | Freq: Once | CUTANEOUS | Status: DC

## 2017-01-27 MED ORDER — POTASSIUM CHLORIDE IN NACL 20-0.9 MEQ/L-% IV SOLN: INTRAVENOUS | Status: DC

## 2017-01-27 MED ORDER — METOPROLOL SUCCINATE ER 25 MG PO TB24: 25.0000 mg | ORAL_TABLET | Freq: Every day | ORAL | Status: DC

## 2017-01-27 MED ORDER — PROPOFOL 10 MG/ML IV BOLUS
INTRAVENOUS | Status: DC | PRN
  Administered 2017-01-27: 140 mg via INTRAVENOUS

## 2017-01-27 MED ORDER — MAGNESIUM CITRATE PO SOLN: 1.0000 | Freq: Once | ORAL | Status: DC | PRN

## 2017-01-27 MED ORDER — ACETAMINOPHEN 500 MG PO TABS: 1000.0000 mg | ORAL_TABLET | Freq: Three times a day (TID) | ORAL | Status: DC | PRN

## 2017-01-27 MED ORDER — PHENYLEPHRINE HCL 10 MG/ML IJ SOLN
INTRAMUSCULAR | Status: DC | PRN
  Administered 2017-01-27: 80 ug via INTRAVENOUS

## 2017-01-27 MED ORDER — FENTANYL CITRATE (PF) 250 MCG/5ML IJ SOLN
INTRAMUSCULAR | Status: AC
  Filled 2017-01-27: qty 5

## 2017-01-27 MED ORDER — HYDROMORPHONE HCL 1 MG/ML IJ SOLN
0.2500 mg | INTRAMUSCULAR | Status: DC | PRN
  Administered 2017-01-27 (×2): 0.5 mg via INTRAVENOUS

## 2017-01-27 MED ORDER — ONDANSETRON HCL 4 MG/2ML IJ SOLN: 4.0000 mg | Freq: Four times a day (QID) | INTRAMUSCULAR | Status: DC | PRN

## 2017-01-27 MED ORDER — LIDOCAINE-EPINEPHRINE 0.5 %-1:200000 IJ SOLN
INTRAMUSCULAR | Status: AC
Start: 2017-01-27 — End: 2017-01-27
  Filled 2017-01-27: qty 1

## 2017-01-27 MED ORDER — LIDOCAINE HCL (CARDIAC) 20 MG/ML IV SOLN
INTRAVENOUS | Status: AC
  Filled 2017-01-27: qty 5

## 2017-01-27 MED ORDER — SUCCINYLCHOLINE CHLORIDE 20 MG/ML IJ SOLN
INTRAMUSCULAR | Status: DC | PRN
  Administered 2017-01-27: 140 mg via INTRAVENOUS

## 2017-01-27 MED ORDER — BISACODYL 5 MG PO TBEC: 5.0000 mg | DELAYED_RELEASE_TABLET | Freq: Every day | ORAL | Status: DC | PRN

## 2017-01-27 MED ORDER — LACTATED RINGERS IV SOLN
INTRAVENOUS | Status: DC | PRN
  Administered 2017-01-27 (×3): via INTRAVENOUS

## 2017-01-27 MED ORDER — ONDANSETRON HCL 4 MG PO TABS: 4.0000 mg | ORAL_TABLET | Freq: Four times a day (QID) | ORAL | Status: DC | PRN

## 2017-01-27 MED ORDER — MIDAZOLAM HCL 2 MG/2ML IJ SOLN
INTRAMUSCULAR | Status: AC
  Filled 2017-01-27: qty 2

## 2017-01-27 MED ORDER — SUCRALFATE 1 G PO TABS
1.0000 g | ORAL_TABLET | Freq: Three times a day (TID) | ORAL | Status: DC
  Filled 2017-01-27 (×2): qty 1

## 2017-01-27 MED ORDER — CEFAZOLIN SODIUM-DEXTROSE 2-4 GM/100ML-% IV SOLN
2.0000 g | INTRAVENOUS | Status: AC
  Administered 2017-01-27: 2 g via INTRAVENOUS
  Filled 2017-01-27: qty 100

## 2017-01-27 MED ORDER — LIDOCAINE-EPINEPHRINE 0.5 %-1:200000 IJ SOLN
INTRAMUSCULAR | Status: DC | PRN
  Administered 2017-01-27: 9 mL

## 2017-01-27 MED ORDER — TRAMADOL HCL 50 MG PO TABS: 50.0000 mg | ORAL_TABLET | Freq: Four times a day (QID) | ORAL | Status: DC | PRN

## 2017-01-27 MED ORDER — SODIUM CHLORIDE 0.9% FLUSH: 3.0000 mL | INTRAVENOUS | Status: DC | PRN

## 2017-01-27 MED ORDER — DIAZEPAM 5 MG PO TABS: 10.0000 mg | ORAL_TABLET | Freq: Three times a day (TID) | ORAL | Status: DC | PRN

## 2017-01-27 MED ORDER — SENNOSIDES-DOCUSATE SODIUM 8.6-50 MG PO TABS: 1.0000 | ORAL_TABLET | Freq: Every evening | ORAL | Status: DC | PRN

## 2017-01-27 MED ORDER — SODIUM CHLORIDE 0.9% FLUSH: 3.0000 mL | Freq: Two times a day (BID) | INTRAVENOUS | Status: DC

## 2017-01-27 MED ORDER — HYDROXYZINE HCL 10 MG PO TABS
10.0000 mg | ORAL_TABLET | Freq: Three times a day (TID) | ORAL | Status: DC | PRN
  Filled 2017-01-27: qty 1

## 2017-01-27 MED ORDER — DOCUSATE SODIUM 100 MG PO CAPS: 100.0000 mg | ORAL_CAPSULE | Freq: Two times a day (BID) | ORAL | Status: DC

## 2017-01-27 MED ORDER — PHENOL 1.4 % MT LIQD: 1.0000 | OROMUCOSAL | Status: DC | PRN

## 2017-01-27 MED ORDER — ACETAMINOPHEN 325 MG PO TABS: 650.0000 mg | ORAL_TABLET | ORAL | Status: DC | PRN

## 2017-01-27 MED ORDER — THROMBIN 5000 UNITS EX SOLR
CUTANEOUS | Status: AC
  Filled 2017-01-27: qty 10000

## 2017-01-27 MED ORDER — LITHIUM CARBONATE ER 450 MG PO TBCR
450.0000 mg | EXTENDED_RELEASE_TABLET | Freq: Two times a day (BID) | ORAL | Status: DC
  Filled 2017-01-27: qty 1

## 2017-01-27 MED ORDER — LACTATED RINGERS IV SOLN
INTRAVENOUS | Status: DC
Start: 2017-01-27 — End: 2017-01-27
  Administered 2017-01-27: 10:00:00 via INTRAVENOUS

## 2017-01-27 MED ORDER — ACETAMINOPHEN 650 MG RE SUPP: 650.0000 mg | RECTAL | Status: DC | PRN

## 2017-01-27 MED ORDER — LIDOCAINE HCL (CARDIAC) 20 MG/ML IV SOLN
INTRAVENOUS | Status: DC | PRN
  Administered 2017-01-27: 100 mg via INTRAVENOUS

## 2017-01-27 MED ORDER — THROMBIN 5000 UNITS EX SOLR
CUTANEOUS | Status: AC
  Filled 2017-01-27: qty 5000

## 2017-01-27 MED ORDER — THROMBIN 5000 UNITS EX SOLR
CUTANEOUS | Status: DC | PRN
  Administered 2017-01-27 (×3): 5000 [IU] via TOPICAL

## 2017-01-27 MED ORDER — SUGAMMADEX SODIUM 200 MG/2ML IV SOLN
INTRAVENOUS | Status: DC | PRN
  Administered 2017-01-27: 400 mg via INTRAVENOUS

## 2017-01-27 MED ORDER — TIZANIDINE HCL 4 MG PO TABS: 4.0000 mg | ORAL_TABLET | Freq: Four times a day (QID) | ORAL | 0 refills | Status: DC | PRN

## 2017-01-27 MED ORDER — HYDROCORTISONE 0.5 % EX CREA
1.0000 "application " | TOPICAL_CREAM | Freq: Two times a day (BID) | CUTANEOUS | Status: DC
  Filled 2017-01-27: qty 28.35

## 2017-01-27 MED ORDER — SODIUM CHLORIDE 0.9 % IV SOLN: 250.0000 mL | INTRAVENOUS | Status: DC

## 2017-01-27 MED ORDER — HYDROCODONE-ACETAMINOPHEN 5-325 MG PO TABS
1.0000 | ORAL_TABLET | ORAL | Status: DC | PRN
Start: 2017-01-27 — End: 2017-01-28
  Administered 2017-01-27: 2 via ORAL
  Filled 2017-01-27: qty 2

## 2017-01-27 MED ORDER — DEXAMETHASONE SODIUM PHOSPHATE 10 MG/ML IJ SOLN
INTRAMUSCULAR | Status: DC | PRN
  Administered 2017-01-27: 10 mg via INTRAVENOUS

## 2017-01-27 SURGICAL SUPPLY — 67 items
ADH SKN CLS APL DERMABOND .7 (GAUZE/BANDAGES/DRESSINGS) ×1
BNDG GAUZE ELAST 4 BULKY (GAUZE/BANDAGES/DRESSINGS) IMPLANT
BUR DRUM 4.0 (BURR) ×2 IMPLANT
BUR MATCHSTICK NEURO 3.0 LAGG (BURR) ×2 IMPLANT
CANISTER SUCT 3000ML PPV (MISCELLANEOUS) ×2 IMPLANT
CARTRIDGE OIL MAESTRO DRILL (MISCELLANEOUS) ×1 IMPLANT
DECANTER SPIKE VIAL GLASS SM (MISCELLANEOUS) ×2 IMPLANT
DERMABOND ADVANCED (GAUZE/BANDAGES/DRESSINGS) ×1
DERMABOND ADVANCED .7 DNX12 (GAUZE/BANDAGES/DRESSINGS) ×1 IMPLANT
DIFFUSER DRILL AIR PNEUMATIC (MISCELLANEOUS) ×2 IMPLANT
DRAPE HALF SHEET 40X57 (DRAPES) ×2 IMPLANT
DRAPE LAPAROTOMY 100X72 PEDS (DRAPES) ×2 IMPLANT
DRAPE MICROSCOPE LEICA (MISCELLANEOUS) ×2 IMPLANT
DRAPE POUCH INSTRU U-SHP 10X18 (DRAPES) ×2 IMPLANT
DURAPREP 6ML APPLICATOR 50/CS (WOUND CARE) ×2 IMPLANT
ELECT COATED BLADE 2.86 ST (ELECTRODE) ×2 IMPLANT
ELECT REM PT RETURN 9FT ADLT (ELECTROSURGICAL) ×2
ELECTRODE REM PT RTRN 9FT ADLT (ELECTROSURGICAL) ×1 IMPLANT
GAUZE SPONGE 4X4 16PLY XRAY LF (GAUZE/BANDAGES/DRESSINGS) IMPLANT
GLOVE BIO SURGEON STRL SZ 6.5 (GLOVE) IMPLANT
GLOVE BIO SURGEON STRL SZ7 (GLOVE) IMPLANT
GLOVE BIO SURGEON STRL SZ7.5 (GLOVE) IMPLANT
GLOVE BIO SURGEON STRL SZ8 (GLOVE) ×2 IMPLANT
GLOVE BIO SURGEON STRL SZ8.5 (GLOVE) IMPLANT
GLOVE BIOGEL M 8.0 STRL (GLOVE) IMPLANT
GLOVE ECLIPSE 6.5 STRL STRAW (GLOVE) ×2 IMPLANT
GLOVE ECLIPSE 7.0 STRL STRAW (GLOVE) IMPLANT
GLOVE ECLIPSE 7.5 STRL STRAW (GLOVE) IMPLANT
GLOVE ECLIPSE 8.0 STRL XLNG CF (GLOVE) IMPLANT
GLOVE ECLIPSE 8.5 STRL (GLOVE) IMPLANT
GLOVE EXAM NITRILE LRG STRL (GLOVE) IMPLANT
GLOVE EXAM NITRILE XL STR (GLOVE) IMPLANT
GLOVE EXAM NITRILE XS STR PU (GLOVE) IMPLANT
GLOVE INDICATOR 6.5 STRL GRN (GLOVE) IMPLANT
GLOVE INDICATOR 7.0 STRL GRN (GLOVE) IMPLANT
GLOVE INDICATOR 7.5 STRL GRN (GLOVE) IMPLANT
GLOVE INDICATOR 8.0 STRL GRN (GLOVE) IMPLANT
GLOVE INDICATOR 8.5 STRL (GLOVE) ×2 IMPLANT
GLOVE OPTIFIT SS 8.0 STRL (GLOVE) IMPLANT
GLOVE SURG SS PI 6.5 STRL IVOR (GLOVE) IMPLANT
GOWN STRL REUS W/ TWL LRG LVL3 (GOWN DISPOSABLE) ×2 IMPLANT
GOWN STRL REUS W/ TWL XL LVL3 (GOWN DISPOSABLE) ×1 IMPLANT
GOWN STRL REUS W/TWL 2XL LVL3 (GOWN DISPOSABLE) IMPLANT
GOWN STRL REUS W/TWL LRG LVL3 (GOWN DISPOSABLE) ×2
GOWN STRL REUS W/TWL XL LVL3 (GOWN DISPOSABLE) ×2
HEMOSTAT POWDER KIT SURGIFOAM (HEMOSTASIS) ×2 IMPLANT
KIT BASIN OR (CUSTOM PROCEDURE TRAY) ×2 IMPLANT
KIT ROOM TURNOVER OR (KITS) ×2 IMPLANT
NEEDLE HYPO 25X1 1.5 SAFETY (NEEDLE) ×2 IMPLANT
NEEDLE SPNL 22GX3.5 QUINCKE BK (NEEDLE) ×4 IMPLANT
NS IRRIG 1000ML POUR BTL (IV SOLUTION) ×2 IMPLANT
OIL CARTRIDGE MAESTRO DRILL (MISCELLANEOUS) ×2
PACK LAMINECTOMY NEURO (CUSTOM PROCEDURE TRAY) ×2 IMPLANT
PAD ARMBOARD 7.5X6 YLW CONV (MISCELLANEOUS) ×6 IMPLANT
PLATE ANT CERV XTEND 2 LV 28 (Plate) ×2 IMPLANT
RUBBERBAND STERILE (MISCELLANEOUS) ×4 IMPLANT
SCREW XTD VAR 4.2 SELF TAP 12 (Screw) ×12 IMPLANT
SPACER ACF PARALLEL 7MM (Bone Implant) ×2 IMPLANT
SPACER PARALLEL 6MM CC ACF (Bone Implant) ×2 IMPLANT
SPONGE INTESTINAL PEANUT (DISPOSABLE) ×2 IMPLANT
SPONGE SURGIFOAM ABS GEL SZ50 (HEMOSTASIS) ×2 IMPLANT
SUT VIC AB 0 CT1 27 (SUTURE) ×2
SUT VIC AB 0 CT1 27XBRD ANTBC (SUTURE) ×1 IMPLANT
SUT VIC AB 3-0 SH 8-18 (SUTURE) ×4 IMPLANT
TOWEL GREEN STERILE (TOWEL DISPOSABLE) ×2 IMPLANT
TOWEL GREEN STERILE FF (TOWEL DISPOSABLE) ×2 IMPLANT
WATER STERILE IRR 1000ML POUR (IV SOLUTION) ×2 IMPLANT

## 2017-01-27 NOTE — Anesthesia Preprocedure Evaluation (Addendum)
Anesthesia Evaluation  Patient identified by MRN, date of birth, ID band Patient awake    Reviewed: Allergy & Precautions, NPO status , Patient's Chart, lab work & pertinent test results, reviewed documented beta blocker date and time   Airway Mallampati: I  TM Distance: >3 FB Neck ROM: Limited    Dental  (+) Lower Dentures, Dental Advisory Given, Teeth Intact   Pulmonary Current Smoker,    Pulmonary exam normal breath sounds clear to auscultation       Cardiovascular hypertension, Pt. on medications negative cardio ROS Normal cardiovascular exam Rhythm:Regular Rate:Normal     Neuro/Psych Anxiety Depression    GI/Hepatic hiatal hernia,   Endo/Other  diabetes, Well Controlled, Type 1, Insulin Dependent  Renal/GU   negative genitourinary   Musculoskeletal   Abdominal (+) + obese,   Bowel sounds: normal.  Peds negative pediatric ROS (+)  Hematology   Anesthesia Other Findings   Reproductive/Obstetrics negative OB ROS                            Anesthesia Physical Anesthesia Plan  ASA: III  Anesthesia Plan: General   Post-op Pain Management:    Induction: Intravenous  PONV Risk Score and Plan: 2 and Ondansetron, Dexamethasone, Propofol and Midazolam  Airway Management Planned: Oral ETT  Additional Equipment:   Intra-op Plan:   Post-operative Plan: Extubation in OR  Informed Consent: I have reviewed the patients History and Physical, chart, labs and discussed the procedure including the risks, benefits and alternatives for the proposed anesthesia with the patient or authorized representative who has indicated his/her understanding and acceptance.   Dental advisory given and History available from chart only  Plan Discussed with: CRNA and Surgeon  Anesthesia Plan Comments:        Anesthesia Quick Evaluation

## 2017-01-27 NOTE — Discharge Instructions (Signed)

## 2017-01-27 NOTE — Op Note (Signed)
01/27/2017  7:01 PM  PATIENT:  Meghan Park  46 y.o. 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  BLOOD ADMINISTERED:none  CELL SAVER GIVEN:none  COUNT:per nursing  DRAINS: none   SPECIMEN:  No Specimen  DICTATION: Mrs. Lezama was taken to the operating room, intubated, and placed under general anesthesia without difficulty. She was positioned supine with her head in slight extension on a horseshoe headrest. The neck was prepped and draped in a sterile manner. I infiltrated 9 cc's 1/2%lidocaine/1:200,000 strength epinephrine into the planned incision starting from the midline to the medial border of the left sternocleidomastoid muscle. I opened the incision with a 10 blade and dissected sharply through soft tissue to the platysma. I dissected in the plane superior to the platysma both rostrally and caudally. I then opened the platysma in a horizontal fashion with Metzenbaum scissors, and dissected in the inferior plane rostrally and caudally. With both blunt and sharp technique I created an avascular corridor to the cervical spine. I placed a spinal needle(s) in the disc space at 5/6 . I then reflected the longus colli from C5 to C7 and placed self retaining retractors. I opened the disc space(s) at 5/6,6/7 with a 15 blade. I removed disc with curettes, Kerrison punches, and the drill. Using the drill I removed osteophytes and prepared for the decompression.  I decompressed the spinal canal and the C6, and 7 root(s) with the drill, Kerrison punches,  and the curettes. I used the microscope to aid in microdissection. I removed the posterior longitudinal ligament to fully expose and decompress the thecal sac. I exposed the roots laterally taking down the C5/6,6/7  uncovertebral joints. With the decompression complete we moved on to the arthrodesis. We used the drill to level the surfaces of C5,6,and 7. I removed soft tissue to prepare the disc space and the bony surfaces. I measured the space and placed a 6mm structural allograft into the disc space at C5/6, and a 7mm structural allograft at C6/7.  We then placed the anterior instrumentation. I placed 2 screws in each vertebral body through the plate. I locked the screws into place. Intraoperative xray showed the graft, plate, and screws to be in good position. I irrigated the wound, achieved hemostasis, and closed the wound in layers. I approximated the platysma, and the subcuticular plane with vicryl sutures. I used Dermabond for a sterile dressing.   PLAN OF CARE: Admit for overnight observation  PATIENT DISPOSITION:  PACU - hemodynamically stable.   Delay start of Pharmacological VTE agent (>24hrs) due to surgical blood loss or risk of bleeding:  yes

## 2017-01-27 NOTE — Transfer of Care (Signed)
Immediate Anesthesia Transfer of Care Note  Patient: Meghan Park  Procedure(s) Performed: Procedure(s): ANTERIOR CERVICAL DECOMPRESSION/DISCECTOMY FUSION CERVICAL FIVE - CERVICAL SIX  CERVICAL SIX - CERVICAL SEVEN (N/A)  Patient Location: PACU  Anesthesia Type:General  Level of Consciousness: awake and alert   Airway & Oxygen Therapy: Patient Spontanous Breathing and Patient connected to nasal cannula oxygen  Post-op Assessment: Report given to RN and Post -op Vital signs reviewed and stable  Post vital signs: Reviewed and stable  Last Vitals:  Vitals:   01/27/17 1016 01/27/17 1635  BP: 137/89 138/80  Pulse: 90 (!) 105  Resp: 18 12  Temp: 36.9 C 37.1 C    Last Pain:  Vitals:   01/27/17 1644  TempSrc:   PainSc: 5          Complications: No apparent anesthesia complications

## 2017-01-27 NOTE — Anesthesia Procedure Notes (Signed)
Procedure Name: Intubation Date/Time: 01/27/2017 1:43 PM Performed by: Lanell MatarBAKER, Elissa Grieshop M Pre-anesthesia Checklist: Patient identified, Emergency Drugs available, Suction available and Patient being monitored Patient Re-evaluated:Patient Re-evaluated prior to inductionOxygen Delivery Method: Circle System Utilized Preoxygenation: Pre-oxygenation with 100% oxygen Intubation Type: IV induction, Rapid sequence and Cricoid Pressure applied Laryngoscope Size: Miller and 2 Grade View: Grade I Tube type: Oral Tube size: 7.0 mm Number of attempts: 1 Airway Equipment and Method: Stylet and Oral airway Placement Confirmation: ETT inserted through vocal cords under direct vision,  positive ETCO2 and breath sounds checked- equal and bilateral Secured at: 22 cm Tube secured with: Tape Dental Injury: Teeth and Oropharynx as per pre-operative assessment  Comments: RSI due to history of gastroparesis

## 2017-01-27 NOTE — H&P (Signed)
BP 137/89   Pulse 90   Temp 98.4 F (36.9 C) (Oral)   Resp 18   Ht 5\' 2"  (1.575 m)   Wt 86.5 kg (190 lb 9.6 oz)   SpO2 100%   BMI 34.86 kg/m  Meghan Park comes in today for evaluation of pain that she has in her neck and right upper extremity. This pain has been present since 10/10/2016. She says she awoke with what she thought was a crick in her neck. However, it did not improve and the pain extended into the right upper extremity. She has had physical therapy including cervical traction without relief. She has had cervical epidural injections without relief. It also raised her blood sugar to the 400s and the endocrinologist her never again. 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 She is right handed. She does smoke. She does not use alcohol. She has no history of substance abuse. She has undergone cesarean sections x 2, has had a hysterectomy and a cholecystectomy. She is allergic to Morphine, Sitagliptin, Dapagliflozin, and Metoclopramide. She takes Lithium Carbonate, Hydroxyzine, Humalog Insulin, Sucralfate, Hyoscyamine, Zofran, Diazepam, Tylenol p.r.n. She is 5 feet 2 inches, weighs 190.2 pounds, temperature is 98.3, blood pressure is 122/82, pulse is 92, pain is 7/10. Hypertension and heart disease are present in the family history. She has pain in the arm, neck, and back. She says heat will relieve the pain. It has been 7 now for about a week. Says the pain has gotten worse. It sharper and she find less relief. Weakness in her right upper extremity, numbness and tingling in her hands and feet. She has headaches and she has had 1 episode where she lost urinary continence. REVIEW OF SYSTEMS: Positive for night sweats, infections, balance problems, bronchitis, difficulty with urinary stream and UTIs, abdominal pain, peptic ulcer disease, vomiting, nausea, swelling in the feet, irregular pulse, arthritis, neck pain, arm weakness, back pain, arm pain, joint pain, problems  with coordination, anxiety, depression, excessive thirst, and anemia. EXAM: She is alert, oriented by 4, answers all questions appropriately. Memory, language, attention span, and fund of knowledge are normal. There is a hint of a facial droop on the right, but I think it is just that she has symmetric activation. Symmetric sensation. Pupils equal, round, and reactive to light. Full extraocular movements. Full visual fields. Hearing intact to voice. Uvula elevates midline. Shoulder shrug is normal. Tongue protrudes in the midline. 5/5 strength in the lower extremities. She has some weakness in the grips bilaterally at 4/5. Otherwise 5/5. 2+ reflexes, biceps, triceps, brachioradialis, knees, and ankles. Romberg is negative. She can tandem walk. She can toe walk and heel walk.  MRI, plain films also reviewed. MRI shows canal stenosis present at 5-6. Severe foraminal stenosis on the left side present at 6-7.  Plain films show severe degenerative changes present at the 5-6, 6-7 disc spaces. Some listhesis there also at 6-7.  Given the fact that she has had this pain now for 3 months and has tried multiple modalities, including traction therapy, injections without relief, and since she does have significant disease present on the plain films and on the MRI, I do think it reasonable to believe that a decompression and arthrodesis at 5-6 and 6-7 would relieve many of her symptoms. Perfection as I explained to her is not my goal, simply to make her better than she is right now. She understands and wishes to proceed. We will try to  do this operation next week Wednesday.

## 2017-01-27 NOTE — Progress Notes (Signed)
Pt. discharged home accompanied by husband. Prescriptions and discharge instructions given with verbalization of understanding. Incision site on anterial neck is without any  s/s of infection - no swelling, redness, bleeding, and/or drainage noted. Opportunity given to ask questions but no question asked. Pt. transported out of this unit in wheelchair by nurse without any problems.

## 2017-01-28 NOTE — Anesthesia Postprocedure Evaluation (Signed)
Anesthesia Post Note  Patient: Kariah L Haralson  Procedure(s) Performed: Procedure(s) (LRB): ANTERIOR CERVICAL DECOMPRESSION/DISCECTOMY FUSION CERVICAL FIVE - CERVICAL SIX  CERVICAL SIX - CERVICAL SEVEN (N/A)     Patient location during evaluation: PACU Anesthesia Type: General Level of consciousness: awake and alert Pain management: pain level controlled Vital Signs Assessment: post-procedure vital signs reviewed and stable Respiratory status: spontaneous breathing, nonlabored ventilation, respiratory function stable and patient connected to nasal cannula oxygen Cardiovascular status: blood pressure returned to baseline and stable Postop Assessment: no signs of nausea or vomiting Anesthetic complications: no    Last Vitals:  Vitals:   01/27/17 1754 01/27/17 1944  BP: (!) 149/89 (!) 139/97  Pulse: 100 99  Resp: 14 18  Temp: 36.9 C 37 C    Last Pain:  Vitals:   01/27/17 1944  TempSrc: Oral  PainSc:                  Teddie Curd DAVID

## 2017-01-31 ENCOUNTER — Encounter (HOSPITAL_COMMUNITY): Payer: Self-pay | Admitting: Neurosurgery

## 2017-02-01 ENCOUNTER — Ambulatory Visit: Payer: 59 | Admitting: Endocrinology

## 2017-02-01 DIAGNOSIS — Z0289 Encounter for other administrative examinations: Secondary | ICD-10-CM

## 2017-02-02 ENCOUNTER — Other Ambulatory Visit (HOSPITAL_COMMUNITY): Payer: Self-pay

## 2017-02-02 DIAGNOSIS — F411 Generalized anxiety disorder: Secondary | ICD-10-CM

## 2017-02-02 DIAGNOSIS — F332 Major depressive disorder, recurrent severe without psychotic features: Secondary | ICD-10-CM

## 2017-02-02 MED ORDER — LITHIUM CARBONATE ER 450 MG PO TBCR: 450.0000 mg | EXTENDED_RELEASE_TABLET | Freq: Every day | ORAL | 1 refills | Status: DC

## 2017-02-02 MED ORDER — HYDROXYZINE HCL 10 MG PO TABS: 10.0000 mg | ORAL_TABLET | Freq: Every day | ORAL | 1 refills | Status: DC

## 2017-02-21 ENCOUNTER — Other Ambulatory Visit (HOSPITAL_COMMUNITY): Payer: Self-pay

## 2017-02-21 DIAGNOSIS — F332 Major depressive disorder, recurrent severe without psychotic features: Secondary | ICD-10-CM

## 2017-02-21 DIAGNOSIS — F411 Generalized anxiety disorder: Secondary | ICD-10-CM

## 2017-02-21 MED ORDER — LITHIUM CARBONATE ER 450 MG PO TBCR: 450.0000 mg | EXTENDED_RELEASE_TABLET | Freq: Two times a day (BID) | ORAL | 1 refills | Status: DC

## 2017-02-21 MED ORDER — HYDROXYZINE HCL 10 MG PO TABS: 10.0000 mg | ORAL_TABLET | Freq: Two times a day (BID) | ORAL | 1 refills | Status: DC | PRN

## 2017-03-07 ENCOUNTER — Ambulatory Visit (INDEPENDENT_AMBULATORY_CARE_PROVIDER_SITE_OTHER): Payer: 59 | Admitting: Nurse Practitioner

## 2017-03-07 ENCOUNTER — Encounter: Payer: Self-pay | Admitting: Nurse Practitioner

## 2017-03-07 VITALS — BP 128/80 | HR 101 | Temp 98.5°F | Ht 62.0 in | Wt 184.0 lb

## 2017-03-07 DIAGNOSIS — N764 Abscess of vulva: Secondary | ICD-10-CM | POA: Diagnosis not present

## 2017-03-07 MED ORDER — CHLORHEXIDINE GLUCONATE 4 % EX LIQD: Freq: Every day | CUTANEOUS | 0 refills | Status: DC | PRN

## 2017-03-07 MED ORDER — DOXYCYCLINE HYCLATE 100 MG PO TABS: 100.0000 mg | ORAL_TABLET | Freq: Two times a day (BID) | ORAL | 0 refills | Status: AC

## 2017-03-07 NOTE — Progress Notes (Signed)
Subjective:  Patient ID: Meghan Park, female    DOB: 01-29-1971  Age: 46 y.o. MRN: [id number]  CC: Recurrent Skin Infections (boil on left side upper leg--1 wk--painful--3rd on this months/had neck surgery 7/5-dizzy?)  HPI Ms. Koon present with recurrent boils (ABD and groin) x 1week. recent hospitalization for neck surgery.  Hx of MRSA. No fever, no ABD pain.  Outpatient Medications Prior to Visit  Medication Sig Dispense Refill  . acetaminophen (TYLENOL) 500 MG tablet Take 1,000 mg by mouth every 8 (eight) hours as needed for mild pain.    . Blood Glucose Monitoring Suppl (ONETOUCH VERIO) W/DEVICE KIT 1 Act by Does not apply route 3 (three) times daily. 2 kit 0  . cyanocobalamin 2000 MCG tablet Take 1 tablet (2,000 mcg total) by mouth daily. 90 tablet 3  . diazepam (VALIUM) 10 MG tablet Place 10 mg vaginally every 8 (eight) hours as needed (for pelvic spasms.).     [name] Kitchen diphenhydramine-acetaminophen (TYLENOL PM) 25-500 MG TABS tablet Take 2 tablets by mouth at bedtime as needed (sleep).     . gabapentin (NEURONTIN) 100 MG capsule Take 1 capsule (100 mg total) by mouth 3 (three) times daily. 90 capsule 11  . glucose blood test strip Use TID 100 each 12  . HYDROcodone-acetaminophen (NORCO/VICODIN) 5-325 MG tablet Take 1 tablet by mouth every 6 (six) hours as needed for moderate pain. 30 tablet 0  . hydrocortisone cream 0.5 % Apply 1 application topically 2 (two) times daily. 30 g 0  . hydrOXYzine (ATARAX/VISTARIL) 10 MG tablet Take 1 tablet (10 mg total) by mouth 2 (two) times daily as needed. 60 tablet 1  . hyoscyamine (LEVBID) 0.375 MG 12 hr tablet TAKE 1 TABLET(0.375 MG) BY MOUTH EVERY 12 HOURS AS NEEDED (Patient taking differently: TAKE 1 TABLET(0.375 MG) BY MOUTH EVERY 12 HOURS) 60 tablet 5  . insulin lispro (HUMALOG KWIKPEN) 100 UNIT/ML KiwkPen Inject 0.25 mLs (25 Units total) into the skin 3 (three) times daily. 30 mL 3  . lithium carbonate (ESKALITH) 450 MG CR tablet Take 1 tablet  (450 mg total) by mouth 2 (two) times daily. 60 tablet 1  . metoprolol succinate (TOPROL-XL) 25 MG 24 hr tablet Take 1 tablet (25 mg total) by mouth daily. 90 tablet 3  . sucralfate (CARAFATE) 1 g tablet Take 1 g by mouth 4 (four) times daily -  with meals and at bedtime.    [name] Kitchen tiZANidine (ZANAFLEX) 4 MG tablet Take 1 tablet (4 mg total) by mouth every 6 (six) hours as needed for muscle spasms. 60 tablet 0   No facility-administered medications prior to visit.     ROS See HPI  Objective:  BP 128/80   Pulse (!) 101   Temp 98.5 F (36.9 C)   Ht '5\' 2"'  (1.575 m)   Wt 184 lb (83.5 kg)   SpO2 97%   BMI 33.65 kg/m   BP Readings from Last 3 Encounters:  03/07/17 128/80  01/27/17 (!) 139/97  01/20/17 116/77    Wt Readings from Last 3 Encounters:  03/07/17 184 lb (83.5 kg)  01/27/17 190 lb 9.6 oz (86.5 kg)  01/20/17 190 lb 9.6 oz (86.5 kg)    Physical Exam  Constitutional: She is oriented to person, place, and time. No distress.  Cardiovascular: Normal rate.   Pulmonary/Chest: Effort normal.  Neurological: She is alert and oriented to person, place, and time.  Skin: Skin is warm. Rash noted. Rash is pustular. There is erythema.  Vitals reviewed.   Lab Results  Component Value Date   WBC 7.3 01/20/2017   HGB 12.5 01/20/2017   HCT 37.7 01/20/2017   PLT 221 01/20/2017   GLUCOSE 198 (H) 01/20/2017   CHOL 174 10/12/2016   TRIG 252.0 (H) 10/12/2016   HDL 40.60 10/12/2016   LDLDIRECT 99.0 10/12/2016   LDLCALC 96 04/15/2014   ALT 10 10/12/2016   AST 12 10/12/2016   NA 134 (L) 01/20/2017   K 3.4 (L) 01/20/2017   CL 103 01/20/2017   CREATININE 0.71 01/20/2017   BUN 6 01/20/2017   CO2 23 01/20/2017   TSH 1.24 10/12/2016   INR 1.18 04/22/2012   HGBA1C 8.1 (H) 10/12/2016   MICROALBUR <0.7 10/12/2016    No results found.  Assessment & Plan:   Peniel was seen today for recurrent skin infections.  Diagnoses and all orders for this visit:  Furuncle of labia  majora -     doxycycline (VIBRA-TABS) 100 MG tablet; Take 1 tablet (100 mg total) by mouth 2 (two) times daily. -     chlorhexidine (HIBICLENS) 4 % external liquid; Apply topically daily as needed.   I am having Ms. Gatchell start on doxycycline and chlorhexidine. I am also having her maintain her gabapentin, glucose blood, ONETOUCH VERIO, diazepam, diphenhydramine-acetaminophen, hydrocortisone cream, metoprolol succinate, hyoscyamine, cyanocobalamin, insulin lispro, sucralfate, acetaminophen, HYDROcodone-acetaminophen, tiZANidine, hydrOXYzine, and lithium carbonate.  Meds ordered this encounter  Medications  . doxycycline (VIBRA-TABS) 100 MG tablet    Sig: Take 1 tablet (100 mg total) by mouth 2 (two) times daily.    Dispense:  14 tablet    Refill:  0    Order Specific Question:   Supervising Provider    Answer:   Cassandria Anger [[id number]]  . chlorhexidine (HIBICLENS) 4 % external liquid    Sig: Apply topically daily as needed.    Dispense:  120 mL    Refill:  0    Order Specific Question:   Supervising Provider    Answer:   Cassandria Anger [[id number]]    Follow-up: Return if symptoms worsen or fail to improve.  Wilfred Lacy, NP

## 2017-03-07 NOTE — Patient Instructions (Signed)
Return to office if no improvement in 1week  Skin Abscess A skin abscess is an infected area on or under your skin that contains pus and other material. An abscess can happen almost anywhere on your body. Some abscesses break open (rupture) on their own. Most continue to get worse unless they are treated. The infection can spread deeper into the body and into your blood, which can make you feel sick. Treatment usually involves draining the abscess. Follow these instructions at home: Abscess Care  If you have an abscess that has not drained, place a warm, clean, wet washcloth over the abscess several times a day. Do this as told by your doctor.  Follow instructions from your doctor about how to take care of your abscess. Make sure you: ? Cover the abscess with a bandage (dressing). ? Change your bandage or gauze as told by your doctor. ? Wash your hands with soap and water before you change the bandage or gauze. If you cannot use soap and water, use hand sanitizer.  Check your abscess every day for signs that the infection is getting worse. Check for: ? More redness, swelling, or pain. ? More fluid or blood. ? Warmth. ? More pus or a bad smell. Medicines   Take over-the-counter and prescription medicines only as told by your doctor.  If you were prescribed an antibiotic medicine, take it as told by your doctor. Do not stop taking the antibiotic even if you start to feel better. General instructions  To avoid spreading the infection: ? Do not share personal care items, towels, or hot tubs with others. ? Avoid making skin-to-skin contact with other people.  Keep all follow-up visits as told by your doctor. This is important. Contact a doctor if:  You have more redness, swelling, or pain around your abscess.  You have more fluid or blood coming from your abscess.  Your abscess feels warm when you touch it.  You have more pus or a bad smell coming from your abscess.  You have a  fever.  Your muscles ache.  You have chills.  You feel sick. Get help right away if:  You have very bad (severe) pain.  You see red streaks on your skin spreading away from the abscess. This information is not intended to replace advice given to you by your health care provider. Make sure you discuss any questions you have with your health care provider. Document Released: 12/29/2007 Document Revised: 03/07/2016 Document Reviewed: 05/21/2015 Elsevier Interactive Patient Education  2018 [employer].   How to Take a [employer] A sitz bath is a warm water bath that is taken while you are sitting down. The water should only come up to your hips and should cover your buttocks. Your health care provider may recommend a sitz bath to help you:  Clean the lower part of your body, including your genital area.  With itching.  With pain.  With sore muscles or muscles that tighten or spasm.  How to take a sitz bath Take 3-4 sitz baths per day or as told by your health care provider. 1. Partially fill a bathtub with warm water. You will only need the water to be deep enough to cover your hips and buttocks when you are sitting in it. 2. If your health care provider told you to put medicine in the water, follow the directions exactly. 3. Sit in the water and open the tub drain a little. 4. Turn on the warm water again  to keep the tub at the correct level. Keep the water running constantly. 5. Soak in the water for 15-20 minutes or as told by your health care provider. 6. After the sitz bath, pat the affected area dry first. Do not rub it. 7. Be careful when you stand up after the sitz bath because you may feel dizzy.  Contact a health care provider if:  Your symptoms get worse. Do not continue with sitz baths if your symptoms get worse.  You have new symptoms. Do not continue with sitz baths until you talk with your health care provider. This information is not intended to replace advice  given to you by your health care provider. Make sure you discuss any questions you have with your health care provider. Document Released: 04/03/2004 Document Revised: 12/10/2015 Document Reviewed: 07/10/2014 Elsevier Interactive Patient Education  [employer]2018 Elsevier Inc.

## 2017-03-31 ENCOUNTER — Ambulatory Visit (INDEPENDENT_AMBULATORY_CARE_PROVIDER_SITE_OTHER): Payer: 59 | Admitting: Psychiatry

## 2017-03-31 ENCOUNTER — Encounter (HOSPITAL_COMMUNITY): Payer: Self-pay | Admitting: Psychiatry

## 2017-03-31 VITALS — BP 128/74 | HR 76 | Ht 62.0 in | Wt 186.8 lb

## 2017-03-31 DIAGNOSIS — Z79899 Other long term (current) drug therapy: Secondary | ICD-10-CM

## 2017-03-31 DIAGNOSIS — F332 Major depressive disorder, recurrent severe without psychotic features: Secondary | ICD-10-CM | POA: Diagnosis not present

## 2017-03-31 DIAGNOSIS — R002 Palpitations: Secondary | ICD-10-CM

## 2017-03-31 DIAGNOSIS — R0602 Shortness of breath: Secondary | ICD-10-CM | POA: Diagnosis not present

## 2017-03-31 DIAGNOSIS — N951 Menopausal and female climacteric states: Secondary | ICD-10-CM

## 2017-03-31 DIAGNOSIS — Z818 Family history of other mental and behavioral disorders: Secondary | ICD-10-CM

## 2017-03-31 DIAGNOSIS — Z811 Family history of alcohol abuse and dependence: Secondary | ICD-10-CM

## 2017-03-31 DIAGNOSIS — F17210 Nicotine dependence, cigarettes, uncomplicated: Secondary | ICD-10-CM

## 2017-03-31 DIAGNOSIS — G4700 Insomnia, unspecified: Secondary | ICD-10-CM

## 2017-03-31 DIAGNOSIS — R42 Dizziness and giddiness: Secondary | ICD-10-CM

## 2017-03-31 DIAGNOSIS — M549 Dorsalgia, unspecified: Secondary | ICD-10-CM | POA: Diagnosis not present

## 2017-03-31 DIAGNOSIS — F411 Generalized anxiety disorder: Secondary | ICD-10-CM | POA: Diagnosis not present

## 2017-03-31 DIAGNOSIS — M2550 Pain in unspecified joint: Secondary | ICD-10-CM

## 2017-03-31 MED ORDER — CLONIDINE HCL 0.1 MG PO TABS: 0.1000 mg | ORAL_TABLET | Freq: Two times a day (BID) | ORAL | 1 refills | Status: DC | PRN

## 2017-03-31 MED ORDER — LITHIUM CARBONATE ER 300 MG PO TBCR: 600.0000 mg | EXTENDED_RELEASE_TABLET | Freq: Two times a day (BID) | ORAL | 1 refills | Status: DC

## 2017-03-31 NOTE — Progress Notes (Signed)
BH MD/PA/NP OP Progress Note  03/31/2017 3:34 PM Meghan Park  MRN:  [id number]  Chief Complaint:  Chief Complaint    Follow-up     HPI: Pt reports depression is worsening for the last 4 weeks. She states she feels depressed daily. Pt sleep is poor. She is able to able fall asleep but wakes up with panic attack like symptoms 4x/week. Energy is low.  Pt reports daily crying spells, anhedonia, isolation, hopelessness and worthlessness. She is not suicidal but does have passive thoughts of death. Pt denies HI/AVH.   Pt has been on short term disability since July (when she had back surgery). Pt is supposed to go back to work on Sept 17th. Pt states she does not want to go back but feels pressured to do so by her husband.  Pt feels anxious all the time. She has racing thoughts, SOB, hot/cold flashes and palpations. It makes her want to "flee". She tries to deal with it by distracting herself. Pt states Vistaril is not helping.   Taking meds as prescribed and denies SE. Pt felt like Lithium was helping her depression significantly until recently.   Visit Diagnosis:    ICD-10-CM   1. GAD (generalized anxiety disorder) F41.1 cloNIDine (CATAPRES) 0.1 MG tablet  2. Severe episode of recurrent major depressive disorder, without psychotic features ([place]) F33.2 lithium carbonate (LITHOBID) 300 MG CR tablet  3. Encounter for long-term (current) use of medications Z79.899 Lithium level    Comprehensive metabolic panel    TSH      Past Psychiatric History:  Diagnosis: Depression and anxiety  Hospitalizations: denies  Outpatient Care: therapist, PCP manages her psych  Substance Abuse Care: denies  Self-Mutilation:denies  Suicidal Attempts: denies, denies access to guns  Violent Behaviors: denies        Previous meds- Celexa, Remeron, Klonopin, Buspar, Elavil, Zoloft, Cymbalta     Past Medical History:  Past Medical History:  Diagnosis Date  . Adrenal insufficiency ([place])   .  Anal fissure   . Anxiety   . Anxiety associated with depression   . Arthritis    cervical spine , R- hip   . Cholelithiasis   . Chronic headaches   . Chronic kidney disease   . Cystitis   . Depression   . Diabetes mellitus, type II ([place])   . DM (diabetes mellitus), type 2, uncontrolled ([place])   . Fatty liver   . Fibromyalgia   . Gastroparesis   . H pylori ulcer   . Hip pain, right   . History of hiatal hernia   . HLD (hyperlipidemia)   . Hypertension    2018- taken off cardiac med., BP wnl  . Insomnia   . Nausea   . Neuromuscular disorder ([facility])    neuropathy- feet , hi-tone pelvic floor disorder   . Pneumonia hx  . Pneumonia     Past Surgical History:  Procedure Laterality Date  . ABDOMINAL HYSTERECTOMY  2012  . ANTERIOR CERVICAL DECOMP/DISCECTOMY FUSION N/A 01/27/2017   Procedure: ANTERIOR CERVICAL DECOMPRESSION/DISCECTOMY FUSION CERVICAL FIVE - CERVICAL SIX  CERVICAL SIX - CERVICAL SEVEN;  Surgeon: Ashok Pall, MD;  Location: [facility];  Service: Neurosurgery;  Laterality: N/A;  . CESAREAN SECTION  [id number]   x2  . CHOLECYSTECTOMY  04/13/12  . DIAGNOSTIC LAPAROSCOPY  2010  . ERCP  04/24/2012   Procedure: ENDOSCOPIC RETROGRADE CHOLANGIOPANCREATOGRAPHY (ERCP);  Surgeon: Beryle Beams, MD;  Location: [facility];  Service: Endoscopy;  Laterality: N/A;  . ERCP  06/02/2012   Procedure: ENDOSCOPIC RETROGRADE CHOLANGIOPANCREATOGRAPHY (ERCP);  Surgeon: Beryle Beams, MD;  Location: [facility];  Service: Endoscopy;  Laterality: N/A;  . GALLBLADDER SURGERY  2013  . TUBAL LIGATION      Family Psychiatric History: Family History  Problem Relation Age of Onset  . Anxiety disorder Mother   . Hyperlipidemia Mother   . Irritable bowel syndrome Mother   . Arthritis Father   . COPD Father   . Heart disease Father   . Heart disease Maternal Grandfather   . Congestive Heart Failure Maternal Grandmother   . Heart disease Maternal Grandmother   . Heart disease Paternal Grandfather    . Breast cancer Paternal Grandmother        great  . Anxiety disorder Son        social anxiety  . Diabetes Maternal Aunt   . Alcohol abuse Maternal Uncle   . Uterine cancer Maternal Aunt        great  . Colon cancer Neg Hx   . Other Neg Hx        hyponatremia    Social History:  Social History   Social History  . Marital status: Married    Spouse name: N/A  . Number of children: 2  . Years of education: N/A   Occupational History  . coordiantor [employer]   Social History Main Topics  . Smoking status: Current Every Day Smoker    Packs/day: 1.00    Years: 23.00    Types: Cigarettes  . Smokeless tobacco: Never Used     Comment: Tobacco info given 02/25/15  . Alcohol use No     Comment: quit 18 yrs ago  . Drug use: No  . Sexual activity: Yes    Birth control/ protection: Other-see comments     Comment: hysterectomy   Other Topics Concern  . Not on file   Social History Narrative   married   Employment: data entry   [employer] daily    Allergies:  Allergies  Allergen Reactions  . Buprenorphine Hcl Dermatitis    Other reaction(s): Other (See Comments) Severe itching leading to welts and open wounds.  Celesta Gentile [Sitagliptin] Other (See Comments)    GI upset  . Morphine And Related Dermatitis and Other (See Comments)    Severe itching leading to welts and open wounds.  Nada Libman [Promethazine Hcl] Other (See Comments)    TD  . Promethazine Other (See Comments)    Other reaction(s): Other (See Comments) TD  . Reglan [Metoclopramide] Other (See Comments)    Other reaction(s): Other (See Comments), Tremor (intolerance) Tremors, jittery And headache Tremors, jittery  . Wilder Glade [Dapagliflozin] Other (See Comments)    yeast    Metabolic Disorder Labs: Lab Results  Component Value Date   HGBA1C 8.1 (H) 10/12/2016   No results found for: PROLACTIN Lab Results  Component Value Date   CHOL 174 10/12/2016   TRIG 252.0 (H) 10/12/2016    HDL 40.60 10/12/2016   CHOLHDL 4 10/12/2016   VLDL 50.4 (H) 10/12/2016   LDLCALC 96 04/15/2014   LDLCALC 107 (H) 04/06/2013   Lab Results  Component Value Date   TSH 1.24 10/12/2016   TSH 1.33 06/17/2015    Therapeutic Level Labs: No results found for: LITHIUM No results found for: VALPROATE No components found for:  CBMZ  Current Medications: Current Outpatient Prescriptions  Medication Sig Dispense Refill  . acetaminophen (TYLENOL) 500 MG tablet Take 1,000  mg by mouth every 8 (eight) hours as needed for mild pain.    . Blood Glucose Monitoring Suppl (ONETOUCH VERIO) W/DEVICE KIT 1 Act by Does not apply route 3 (three) times daily. 2 kit 0  . chlorhexidine (HIBICLENS) 4 % external liquid Apply topically daily as needed. 120 mL 0  . cyanocobalamin 2000 MCG tablet Take 1 tablet (2,000 mcg total) by mouth daily. 90 tablet 3  . diazepam (VALIUM) 10 MG tablet Place 10 mg vaginally every 8 (eight) hours as needed (for pelvic spasms.).     [name] Kitchen diphenhydramine-acetaminophen (TYLENOL PM) 25-500 MG TABS tablet Take 2 tablets by mouth at bedtime as needed (sleep).     . gabapentin (NEURONTIN) 100 MG capsule Take 1 capsule (100 mg total) by mouth 3 (three) times daily. 90 capsule 11  . glucose blood test strip Use TID 100 each 12  . HYDROcodone-acetaminophen (NORCO/VICODIN) 5-325 MG tablet Take 1 tablet by mouth every 6 (six) hours as needed for moderate pain. 30 tablet 0  . hydrocortisone cream 0.5 % Apply 1 application topically 2 (two) times daily. 30 g 0  . hydrOXYzine (ATARAX/VISTARIL) 10 MG tablet Take 1 tablet (10 mg total) by mouth 2 (two) times daily as needed. 60 tablet 1  . hyoscyamine (LEVBID) 0.375 MG 12 hr tablet TAKE 1 TABLET(0.375 MG) BY MOUTH EVERY 12 HOURS AS NEEDED (Patient taking differently: TAKE 1 TABLET(0.375 MG) BY MOUTH EVERY 12 HOURS) 60 tablet 5  . insulin lispro (HUMALOG KWIKPEN) 100 UNIT/ML KiwkPen Inject 0.25 mLs (25 Units total) into the skin 3 (three) times  daily. 30 mL 3  . lithium carbonate (ESKALITH) 450 MG CR tablet Take 1 tablet (450 mg total) by mouth 2 (two) times daily. 60 tablet 1  . metoprolol succinate (TOPROL-XL) 25 MG 24 hr tablet Take 1 tablet (25 mg total) by mouth daily. 90 tablet 3  . sucralfate (CARAFATE) 1 g tablet Take 1 g by mouth 4 (four) times daily -  with meals and at bedtime.    [name] Kitchen tiZANidine (ZANAFLEX) 4 MG tablet Take 1 tablet (4 mg total) by mouth every 6 (six) hours as needed for muscle spasms. 60 tablet 0   No current facility-administered medications for this visit.      Musculoskeletal: Strength & Muscle Tone: within normal limits Gait & Station: normal Patient leans: N/A  Psychiatric Specialty Exam: Review of Systems  Musculoskeletal: Positive for back pain and joint pain. Negative for neck pain.  Neurological: Positive for dizziness, sensory change and headaches.  Psychiatric/Behavioral: Positive for depression. Negative for hallucinations, substance abuse and suicidal ideas. The patient is nervous/anxious and has insomnia.     Blood pressure 128/74, pulse 76, height _0  (1.575 m), weight 186 lb 12.8 oz (84.7 kg).Body mass index is 34.17 kg/m.  General Appearance: Fairly Groomed  Eye Contact:  Good  Speech:  Clear and Coherent and Normal Rate  Volume:  Normal  Mood:  Depressed  Affect:  Congruent and Tearful  Thought Process:  Goal Directed and Descriptions of Associations: Intact  Orientation:  Full (Time, Place, and Person)  Thought Content: Logical   Suicidal Thoughts:  No  Homicidal Thoughts:  No  Memory:  Immediate;   Good Recent;   Good Remote;   Good  Judgement:  Intact  Insight:  Fair  Psychomotor Activity:  Normal  Concentration:  Concentration: Good and Attention Span: Good  Recall:  Good  Fund of Knowledge: Good  Language: Good  Akathisia:  No  Handed:  Right  AIMS (if indicated): not done  Assets:  Communication Skills Desire for [place] Talents/Skills Transportation  ADL's:  Intact  Cognition: WNL  Sleep:  Poor   Screenings: PHQ2-9     Counselor from 09/12/2014 in [facility] Office Visit from 10/01/2013 in [facility] from 04/06/2013 in [facility]  PHQ-2 Total Score  _0 PHQ-9 Total Score  20  13  -       Assessment and Plan: MDD-recurrent, moderate; GAD; Insomnia   Medication management with supportive therapy. Risks/benefits and SE of the medication discussed. Pt verbalized understanding and verbal consent obtained for treatment.  Affirm with the patient that the medications are taken as ordered. Patient expressed understanding of how their medications were to be used.   The risk of un-intended pregnancy is low based on the fact that pt reports she had a hysterectomy. Pt is aware that these meds carry a teratogenic risk. Pt will discuss plan of action if she does or plans to become pregnant in the future.   Meds: increase Lithobid 660m po BID for depression D/c Vistaril  Start trial of Clonidine 0.162mpo BID prn anxiety If depression continues may consider treatment with Trintellix  Labs: 01/20/2017 K 3.4, CBC WNL Ordered Lithium level, CMP with Bun/Creatinine, TSH    Therapy: brief supportive therapy provided. Discussed psychosocial stressors in detail.     Consultations: Encouraged to restart therapy Encouraged to follow up with PCP as needed  Pt denies SI and is at an acute low risk for suicide. Patient told to call clinic if any problems occur. Patient advised to go to ER if they should develop SI/HI, side effects, or if symptoms worsen. Has crisis numbers to call if needed. Pt verbalized understanding.  F/up in 2 months or sooner if needed    SaCharlcie CradleMD 03/31/2017, 3:34 PM

## 2017-06-02 ENCOUNTER — Ambulatory Visit (HOSPITAL_COMMUNITY): Payer: Self-pay | Admitting: Psychiatry

## 2017-07-06 ENCOUNTER — Other Ambulatory Visit (INDEPENDENT_AMBULATORY_CARE_PROVIDER_SITE_OTHER): Payer: Self-pay

## 2017-07-06 ENCOUNTER — Ambulatory Visit (INDEPENDENT_AMBULATORY_CARE_PROVIDER_SITE_OTHER): Payer: Self-pay | Admitting: Internal Medicine

## 2017-07-06 ENCOUNTER — Encounter: Payer: Self-pay | Admitting: Internal Medicine

## 2017-07-06 VITALS — BP 134/90 | HR 90 | Temp 98.2°F | Ht 62.0 in | Wt 177.5 lb

## 2017-07-06 DIAGNOSIS — D513 Other dietary vitamin B12 deficiency anemia: Secondary | ICD-10-CM

## 2017-07-06 DIAGNOSIS — B3731 Acute candidiasis of vulva and vagina: Secondary | ICD-10-CM

## 2017-07-06 DIAGNOSIS — F418 Other specified anxiety disorders: Secondary | ICD-10-CM

## 2017-07-06 DIAGNOSIS — D500 Iron deficiency anemia secondary to blood loss (chronic): Secondary | ICD-10-CM

## 2017-07-06 DIAGNOSIS — B373 Candidiasis of vulva and vagina: Secondary | ICD-10-CM

## 2017-07-06 DIAGNOSIS — IMO0002 Reserved for concepts with insufficient information to code with codable children: Secondary | ICD-10-CM

## 2017-07-06 DIAGNOSIS — Z23 Encounter for immunization: Secondary | ICD-10-CM

## 2017-07-06 DIAGNOSIS — E118 Type 2 diabetes mellitus with unspecified complications: Secondary | ICD-10-CM

## 2017-07-06 DIAGNOSIS — I10 Essential (primary) hypertension: Secondary | ICD-10-CM

## 2017-07-06 DIAGNOSIS — E1165 Type 2 diabetes mellitus with hyperglycemia: Secondary | ICD-10-CM

## 2017-07-06 LAB — CBC WITH DIFFERENTIAL/PLATELET
BASOS ABS: 0 10*3/uL (ref 0.0–0.1)
Basophils Relative: 0.2 % (ref 0.0–3.0)
Eosinophils Absolute: 0.2 10*3/uL (ref 0.0–0.7)
Eosinophils Relative: 1.8 % (ref 0.0–5.0)
HCT: 43.5 % (ref 36.0–46.0)
Hemoglobin: 14.8 g/dL (ref 12.0–15.0)
LYMPHS ABS: 2.6 10*3/uL (ref 0.7–4.0)
Lymphocytes Relative: 28.3 % (ref 12.0–46.0)
MCHC: 34.1 g/dL (ref 30.0–36.0)
MCV: 93.5 fl (ref 78.0–100.0)
MONO ABS: 0.5 10*3/uL (ref 0.1–1.0)
MONOS PCT: 6 % (ref 3.0–12.0)
NEUTROS ABS: 5.8 10*3/uL (ref 1.4–7.7)
NEUTROS PCT: 63.7 % (ref 43.0–77.0)
PLATELETS: 238 10*3/uL (ref 150.0–400.0)
RBC: 4.66 Mil/uL (ref 3.87–5.11)
RDW: 12.9 % (ref 11.5–15.5)
WBC: 9.2 10*3/uL (ref 4.0–10.5)

## 2017-07-06 MED ORDER — CLONAZEPAM 1 MG PO TABS: 1.0000 mg | ORAL_TABLET | Freq: Two times a day (BID) | ORAL | 3 refills | Status: DC | PRN

## 2017-07-06 MED ORDER — SERTRALINE HCL 100 MG PO TABS: 100.0000 mg | ORAL_TABLET | Freq: Every day | ORAL | 1 refills | Status: DC

## 2017-07-06 NOTE — Progress Notes (Signed)
Subjective:  Patient ID: Meghan Park, female    DOB: June 23, 1971  Age: 46 y.o. MRN: [id number]  CC: Anemia and Diabetes   HPI Meghan Park presents for f/up -she complains of depression and anxiety.  Her PHQ-9 score is 23 and her GAD score is 19.  She saw a psychiatrist earlier this year and has been tried on lithium, clonidine, and antipsychotic and diazepam.  She tells me none of these helped her very much.  She wants to restart Zoloft and Klonopin.  She complains of anxiety, poor eating habits, and weight loss.  She denies SI or HI.  She does not know if her blood sugars are well controlled as she does not check them.  She does complain of symptoms of vaginal yeast infection but she denies polyuria, polydipsia, or polyphagia.  Outpatient Medications Prior to Visit  Medication Sig Dispense Refill  . gabapentin (NEURONTIN) 100 MG capsule Take 1 capsule (100 mg total) by mouth 3 (three) times daily. 90 capsule 11  . glucose blood test strip Use TID 100 each 12  . insulin lispro (HUMALOG KWIKPEN) 100 UNIT/ML KiwkPen Inject 0.25 mLs (25 Units total) into the skin 3 (three) times daily. 30 mL 3  . sucralfate (CARAFATE) 1 g tablet Take 1 g by mouth 4 (four) times daily -  with meals and at bedtime.    . Blood Glucose Monitoring Suppl (ONETOUCH VERIO) W/DEVICE KIT 1 Act by Does not apply route 3 (three) times daily. 2 kit 0  . cloNIDine (CATAPRES) 0.1 MG tablet Take 1 tablet (0.1 mg total) by mouth 2 (two) times daily as needed. (Patient not taking: Reported on 07/06/2017) 60 tablet 1  . cyanocobalamin 2000 MCG tablet Take 1 tablet (2,000 mcg total) by mouth daily. (Patient not taking: Reported on 07/06/2017) 90 tablet 3  . cyclobenzaprine (FLEXERIL) 10 MG tablet Take 10 mg by mouth.    . metoprolol succinate (TOPROL-XL) 25 MG 24 hr tablet Take 1 tablet (25 mg total) by mouth daily. (Patient not taking: Reported on 07/06/2017) 90 tablet 3  . ondansetron (ZOFRAN-ODT) 8 MG disintegrating tablet    10  . acetaminophen (TYLENOL) 500 MG tablet Take 1,000 mg by mouth every 8 (eight) hours as needed for mild pain.    . chlorhexidine (HIBICLENS) 4 % external liquid Apply topically daily as needed. (Patient not taking: Reported on 03/31/2017) 120 mL 0  . diazepam (VALIUM) 10 MG tablet Place 10 mg vaginally every 8 (eight) hours as needed (for pelvic spasms.).     [name] Kitchen diphenhydramine-acetaminophen (TYLENOL PM) 25-500 MG TABS tablet Take 2 tablets by mouth at bedtime as needed (sleep).     [name] Kitchen HYDROcodone-acetaminophen (NORCO/VICODIN) 5-325 MG tablet Take 1 tablet by mouth every 6 (six) hours as needed for moderate pain. (Patient not taking: Reported on 03/31/2017) 30 tablet 0  . hydrocortisone cream 0.5 % Apply 1 application topically 2 (two) times daily. (Patient not taking: Reported on 03/31/2017) 30 g 0  . hyoscyamine (LEVBID) 0.375 MG 12 hr tablet TAKE 1 TABLET(0.375 MG) BY MOUTH EVERY 12 HOURS AS NEEDED (Patient taking differently: TAKE 1 TABLET(0.375 MG) BY MOUTH EVERY 12 HOURS) 60 tablet 5  . lithium carbonate (LITHOBID) 300 MG CR tablet Take 2 tablets (600 mg total) by mouth 2 (two) times daily. 120 tablet 1  . tiZANidine (ZANAFLEX) 4 MG tablet Take 1 tablet (4 mg total) by mouth every 6 (six) hours as needed for muscle spasms. (Patient not taking: Reported on 03/31/2017)  60 tablet 0   No facility-administered medications prior to visit.     ROS Review of Systems  Constitutional: Positive for unexpected weight change. Negative for chills, diaphoresis and fatigue.  HENT: Negative.  Negative for sinus pressure, sore throat and trouble swallowing.   Eyes: Negative.   Respiratory: Negative.  Negative for cough, chest tightness, shortness of breath and wheezing.   Cardiovascular: Negative for chest pain, palpitations and leg swelling.  Gastrointestinal: Negative.  Negative for abdominal pain, constipation, diarrhea, nausea and vomiting.  Endocrine: Negative.  Negative for polydipsia, polyphagia and  polyuria.  Genitourinary: Positive for vaginal discharge. Negative for decreased urine volume, difficulty urinating, dysuria, flank pain, frequency, hematuria, pelvic pain, urgency, vaginal bleeding and vaginal pain.  Musculoskeletal: Negative.  Negative for back pain, myalgias and neck pain.  Skin: Negative.  Negative for color change, pallor and rash.  Neurological: Negative.  Negative for dizziness, weakness, light-headedness and numbness.  Hematological: Negative.  Negative for adenopathy. Does not bruise/bleed easily.  Psychiatric/Behavioral: Positive for dysphoric mood. Negative for behavioral problems, confusion, hallucinations, self-injury, sleep disturbance and suicidal ideas. The patient is nervous/anxious. The patient is not hyperactive.     Objective:  BP 134/90 (BP Location: Left Arm, Patient Position: Sitting, Cuff Size: Normal)   Pulse 90   Temp 98.2 F (36.8 C) (Oral)   Ht _0  (1.575 m)   Wt 177 lb 8 oz (80.5 kg)   SpO2 98%   BMI 32.47 kg/m   BP Readings from Last 3 Encounters:  07/06/17 134/90  03/07/17 128/80  01/27/17 (!) 139/97    Wt Readings from Last 3 Encounters:  07/06/17 177 lb 8 oz (80.5 kg)  03/07/17 184 lb (83.5 kg)  01/27/17 190 lb 9.6 oz (86.5 kg)    Physical Exam  Constitutional: She is oriented to person, place, and time. No distress.  HENT:  Mouth/Throat: Oropharynx is clear and moist. No oropharyngeal exudate.  Eyes: Conjunctivae are normal. Left eye exhibits no discharge. No scleral icterus.  Neck: Normal range of motion. Neck supple. No JVD present. No thyromegaly present.  Cardiovascular: Normal rate, regular rhythm and normal heart sounds.  No murmur heard. Pulmonary/Chest: Effort normal and breath sounds normal. No respiratory distress. She has no rales.  Abdominal: Soft. Bowel sounds are normal. She exhibits no mass. There is no tenderness.  Musculoskeletal: Normal range of motion. She exhibits no edema, tenderness or deformity.    Lymphadenopathy:    She has no cervical adenopathy.  Neurological: She is oriented to person, place, and time.  Skin: Skin is warm and dry. No rash noted. She is not diaphoretic. No erythema. No pallor.  Psychiatric: Judgment normal. Her mood appears anxious. Her affect is not angry. Her speech is not rapid and/or pressured, not delayed and not tangential. She is not agitated, not aggressive, not slowed, not withdrawn and not combative. Cognition and memory are normal. She exhibits a depressed mood. She expresses no homicidal and no suicidal ideation. She expresses no suicidal plans and no homicidal plans.  She is sad, anxious, and tearful. She is attentive.    Lab Results  Component Value Date   WBC 9.2 07/06/2017   HGB 14.8 07/06/2017   HCT 43.5 07/06/2017   PLT 238.0 07/06/2017   GLUCOSE 362 (H) 07/08/2017   CHOL 174 10/12/2016   TRIG 252.0 (H) 10/12/2016   HDL 40.60 10/12/2016   LDLDIRECT 99.0 10/12/2016   LDLCALC 96 04/15/2014   ALT 10 10/12/2016   AST 12 10/12/2016  NA 134 (L) 07/08/2017   K 4.1 07/08/2017   CL 99 07/08/2017   CREATININE 0.70 07/08/2017   BUN 11 07/08/2017   CO2 27 07/08/2017   TSH 1.24 10/12/2016   INR 1.18 04/22/2012   HGBA1C 12.2 (H) 07/08/2017   MICROALBUR <0.7 10/12/2016    No results found.  Assessment & Plan:   Angelee was seen today for anemia and diabetes.  Diagnoses and all orders for this visit:  Type II diabetes mellitus with complication, uncontrolled ([place])- Her A1c is up to 12.2%.  Will continue the quick acting insulin with each meal but I have asked her to add a long-acting insulin (tresiba) as well. -     Ambulatory referral to Ophthalmology -     Basic metabolic panel; Future -     Hemoglobin A1c; Future -     Amb Referral to Nutrition and Diabetic E -     Ambulatory referral to Endocrinology  Essential hypertension-her blood pressure is adequately well controlled.  Electrolytes and renal function are normal. -     Basic  metabolic panel; Future  Iron deficiency anemia due to chronic blood loss- Her H&H are normal now -     CBC with Differential/Platelet; Future  Anxiety associated with depression- Will restart sertraline.  She will also take clonazepam as needed for anxiety and panic. -     clonazePAM (KLONOPIN) 1 MG tablet; Take 1 tablet (1 mg total) by mouth 2 (two) times daily as needed for anxiety. -     sertraline (ZOLOFT) 100 MG tablet; Take 1 tablet (100 mg total) by mouth daily.  Other dietary vitamin B12 deficiency anemia- Her H&H are normal now.  Will continue oral B12 supplementation. -     CBC with Differential/Platelet; Future  Need for influenza vaccination -     Flu Vaccine QUAD 36+ mos IM  Yeast vaginitis -     fluconazole (DIFLUCAN) 150 MG tablet; Take 1 tablet (150 mg total) by mouth once for 1 dose.   I have discontinued Brynnan L. Virgil's diazepam, diphenhydramine-acetaminophen, hydrocortisone cream, hyoscyamine, acetaminophen, HYDROcodone-acetaminophen, tiZANidine, chlorhexidine, and lithium carbonate. I am also having her start on clonazePAM, sertraline, fluconazole, and insulin degludec. Additionally, I am having her maintain her gabapentin, glucose blood, ONETOUCH VERIO, metoprolol succinate, cyanocobalamin, insulin lispro, sucralfate, cloNIDine, cyclobenzaprine, and ondansetron.  Meds ordered this encounter  Medications  . clonazePAM (KLONOPIN) 1 MG tablet    Sig: Take 1 tablet (1 mg total) by mouth 2 (two) times daily as needed for anxiety.    Dispense:  60 tablet    Refill:  3  . sertraline (ZOLOFT) 100 MG tablet    Sig: Take 1 tablet (100 mg total) by mouth daily.    Dispense:  90 tablet    Refill:  1  . fluconazole (DIFLUCAN) 150 MG tablet    Sig: Take 1 tablet (150 mg total) by mouth once for 1 dose.    Dispense:  1 tablet    Refill:  3  . insulin degludec (TRESIBA FLEXTOUCH) 100 UNIT/ML SOPN FlexTouch Pen    Sig: Inject 0.5 mLs (50 Units total) into the skin daily at  10 pm.    Dispense:  3 mL    Refill:  11     Follow-up: Return in about 3 months (around 10/04/2017).  Scarlette Calico, MD

## 2017-07-06 NOTE — Patient Instructions (Signed)
Major Depressive Disorder, Adult Major depressive disorder (MDD) is a mental health condition. It may also be called clinical depression or unipolar depression. MDD usually causes feelings of sadness, hopelessness, or helplessness. MDD can also cause physical symptoms. It can interfere with work, school, relationships, and other everyday activities. MDD may be mild, moderate, or severe. It may occur once (single episode major depressive disorder) or it may occur multiple times (recurrent major depressive disorder). What are the causes? The exact cause of this condition is not known. MDD is most likely caused by a combination of things, which may include:  Genetic factors. These are traits that are passed along from parent to child.  Individual factors. Your personality, your behavior, and the way you handle your thoughts and feelings may contribute to MDD. This includes personality traits and behaviors learned from others.  Physical factors, such as: ? Differences in the part of your brain that controls emotion. This part of your brain may be different than it is in people who do not have MDD. ? Long-term (chronic) medical or psychiatric illnesses.  Social factors. Traumatic experiences or major life changes may play a role in the development of MDD.  What increases the risk? This condition is more likely to develop in women. The following factors may also make you more likely to develop MDD:  A family history of depression.  Troubled family relationships.  Abnormally low levels of certain brain chemicals.  Traumatic events in childhood, especially abuse or the loss of a parent.  Being under a lot of stress, or long-term stress, especially from upsetting life experiences or losses.  A history of: ? Chronic physical illness. ? Other mental health disorders. ? Substance abuse.  Poor living conditions.  Experiencing social exclusion or discrimination on a regular basis.  What are  the signs or symptoms? The main symptoms of MDD typically include:  Constant depressed or irritable mood.  Loss of interest in things and activities.  MDD symptoms may also include:  Sleeping or eating too much or too little.  Unexplained weight change.  Fatigue or low energy.  Feelings of worthlessness or guilt.  Difficulty thinking clearly or making decisions.  Thoughts of suicide or of harming others.  Physical agitation or weakness.  Isolation.  Severe cases of MDD may also occur with other symptoms, such as:  Delusions or hallucinations, in which you imagine things that are not real (psychotic depression).  Low-level depression that lasts at least a year (chronic depression or persistent depressive disorder).  Extreme sadness and hopelessness (melancholic depression).  Trouble speaking and moving (catatonic depression).  How is this diagnosed? This condition may be diagnosed based on:  Your symptoms.  Your medical history, including your mental health history. This may involve tests to evaluate your mental health. You may be asked questions about your lifestyle, including any drug and alcohol use, and how long you have had symptoms of MDD.  A physical exam.  Blood tests to rule out other conditions.  You must have a depressed mood and at least four other MDD symptoms most of the day, nearly every day in the same 2-week timeframe before your health care provider can confirm a diagnosis of MDD. How is this treated? This condition is usually treated by mental health professionals, such as psychologists, psychiatrists, and clinical social workers. You may need more than one type of treatment. Treatment may include:  Psychotherapy. This is also called talk therapy or counseling. Types of psychotherapy include: ? Cognitive behavioral   therapy (CBT). This type of therapy teaches you to recognize unhealthy feelings, thoughts, and behaviors, and replace them with  positive thoughts and actions. ? Interpersonal therapy (IPT). This helps you to improve the way you relate to and communicate with others. ? Family therapy. This treatment includes members of your family.  Medicine to treat anxiety and depression, or to help you control certain emotions and behaviors.  Lifestyle changes, such as: ? Limiting alcohol and drug use. ? Exercising regularly. ? Getting plenty of sleep. ? Making healthy eating choices. ? Spending more time outdoors.  Treatments involving stimulation of the brain can be used in situations with extremely severe symptoms, or when medicine or other therapies do not work over time. These treatments include electroconvulsive therapy, transcranial magnetic stimulation, and vagal nerve stimulation. Follow these instructions at home: Activity  Return to your normal activities as told by your health care provider.  Exercise regularly and spend time outdoors as told by your health care provider. General instructions  Take over-the-counter and prescription medicines only as told by your health care provider.  Do not drink alcohol. If you drink alcohol, limit your alcohol intake to no more than 1 drink a day for nonpregnant women and 2 drinks a day for men. One drink equals 12 oz of beer, 5 oz of wine, or 1 oz of hard liquor. Alcohol can affect any antidepressant medicines you are taking. Talk to your health care provider about your alcohol use.  Eat a healthy diet and get plenty of sleep.  Find activities that you enjoy doing, and make time to do them.  Consider joining a support group. Your health care provider may be able to recommend a support group.  Keep all follow-up visits as told by your health care provider. This is important. Where to find more information: National Alliance on Mental Illness  www.nami.org  [place]. National Institute of Mental Health  www.nimh.nih.gov  National Suicide Prevention  Lifeline  1-800-273-TALK (8255). This is free, 24-hour help.  Contact a health care provider if:  Your symptoms get worse.  You develop new symptoms. Get help right away if:  You self-harm.  You have serious thoughts about hurting yourself or others.  You see, hear, taste, smell, or feel things that are not present (hallucinate). This information is not intended to replace advice given to you by your health care provider. Make sure you discuss any questions you have with your health care provider. Document Released: 11/06/2012 Document Revised: 03/18/2016 Document Reviewed: 01/21/2016 Elsevier Interactive Patient Education  2017 [employer]  

## 2017-07-07 ENCOUNTER — Encounter: Payer: Self-pay | Admitting: Internal Medicine

## 2017-07-07 MED ORDER — FLUCONAZOLE 150 MG PO TABS: 150.0000 mg | ORAL_TABLET | Freq: Once | ORAL | 3 refills | Status: AC

## 2017-07-08 ENCOUNTER — Encounter: Payer: Self-pay | Admitting: Internal Medicine

## 2017-07-08 ENCOUNTER — Other Ambulatory Visit (INDEPENDENT_AMBULATORY_CARE_PROVIDER_SITE_OTHER): Payer: Self-pay

## 2017-07-08 DIAGNOSIS — IMO0002 Reserved for concepts with insufficient information to code with codable children: Secondary | ICD-10-CM

## 2017-07-08 DIAGNOSIS — E1165 Type 2 diabetes mellitus with hyperglycemia: Secondary | ICD-10-CM

## 2017-07-08 DIAGNOSIS — E118 Type 2 diabetes mellitus with unspecified complications: Secondary | ICD-10-CM

## 2017-07-08 DIAGNOSIS — I10 Essential (primary) hypertension: Secondary | ICD-10-CM

## 2017-07-08 LAB — BASIC METABOLIC PANEL
BUN: 11 mg/dL (ref 6–23)
CALCIUM: 9.1 mg/dL (ref 8.4–10.5)
CO2: 27 mEq/L (ref 19–32)
Chloride: 99 mEq/L (ref 96–112)
Creatinine, Ser: 0.7 mg/dL (ref 0.40–1.20)
GFR: 95.57 mL/min (ref >60.00)
GLUCOSE: 362 mg/dL — AB (ref 70–99)
Potassium: 4.1 mEq/L (ref 3.5–5.1)
Sodium: 134 mEq/L — ABNORMAL LOW (ref 135–145)

## 2017-07-08 LAB — HEMOGLOBIN A1C: Hgb A1c MFr Bld: 12.2 % — ABNORMAL HIGH (ref 4.6–6.5)

## 2017-07-08 MED ORDER — INSULIN DEGLUDEC 100 UNIT/ML ~~LOC~~ SOPN: 50.0000 [IU] | PEN_INJECTOR | Freq: Every day | SUBCUTANEOUS | 11 refills | Status: DC

## 2017-07-11 ENCOUNTER — Ambulatory Visit: Payer: Self-pay

## 2017-07-25 ENCOUNTER — Encounter: Payer: Self-pay | Admitting: Internal Medicine

## 2017-07-27 ENCOUNTER — Other Ambulatory Visit: Payer: Self-pay | Admitting: Internal Medicine

## 2017-07-27 DIAGNOSIS — F411 Generalized anxiety disorder: Secondary | ICD-10-CM

## 2017-07-27 MED ORDER — SERTRALINE HCL 100 MG PO TABS: 200.0000 mg | ORAL_TABLET | Freq: Every day | ORAL | 1 refills | Status: AC

## 2017-09-13 ENCOUNTER — Encounter: Payer: Self-pay | Admitting: Internal Medicine

## 2017-11-17 ENCOUNTER — Other Ambulatory Visit: Payer: Self-pay | Admitting: Internal Medicine

## 2017-11-17 DIAGNOSIS — F418 Other specified anxiety disorders: Secondary | ICD-10-CM

## 2018-03-17 ENCOUNTER — Other Ambulatory Visit: Payer: Self-pay | Admitting: Internal Medicine

## 2018-03-17 DIAGNOSIS — F418 Other specified anxiety disorders: Secondary | ICD-10-CM

## 2018-03-17 NOTE — Telephone Encounter (Signed)
Left message for patient to call back to schedule.  °

## 2018-03-17 NOTE — Telephone Encounter (Signed)
Rx rq was refused bc pt needs an appt.

## 2018-09-28 ENCOUNTER — Other Ambulatory Visit: Payer: Self-pay

## 2018-09-28 ENCOUNTER — Other Ambulatory Visit: Payer: Self-pay | Admitting: Neurosurgery

## 2018-09-28 ENCOUNTER — Encounter (HOSPITAL_COMMUNITY): Payer: Self-pay | Admitting: Certified Registered Nurse Anesthetist

## 2018-09-28 ENCOUNTER — Encounter (HOSPITAL_COMMUNITY)
Admission: AD | Disposition: A | Discharge status: Home Health | Payer: Self-pay | Source: Other Acute Inpatient Hospital | Attending: Neurosurgery

## 2018-09-28 ENCOUNTER — Inpatient Hospital Stay (HOSPITAL_COMMUNITY)
Admission: AD | Admit: 2018-09-28 | Discharge: 2018-10-03 | DRG: 471 | Disposition: A | Discharge status: Home Health | Payer: Self-pay | Source: Other Acute Inpatient Hospital | Attending: Neurosurgery | Admitting: Neurosurgery

## 2018-09-28 DIAGNOSIS — M4712 Other spondylosis with myelopathy, cervical region: Principal | ICD-10-CM | POA: Diagnosis present

## 2018-09-28 DIAGNOSIS — E1143 Type 2 diabetes mellitus with diabetic autonomic (poly)neuropathy: Secondary | ICD-10-CM | POA: Diagnosis present

## 2018-09-28 DIAGNOSIS — Z825 Family history of asthma and other chronic lower respiratory diseases: Secondary | ICD-10-CM

## 2018-09-28 DIAGNOSIS — K760 Fatty (change of) liver, not elsewhere classified: Secondary | ICD-10-CM | POA: Diagnosis present

## 2018-09-28 DIAGNOSIS — G959 Disease of spinal cord, unspecified: Secondary | ICD-10-CM | POA: Diagnosis present

## 2018-09-28 DIAGNOSIS — Z885 Allergy status to narcotic agent status: Secondary | ICD-10-CM

## 2018-09-28 DIAGNOSIS — F17210 Nicotine dependence, cigarettes, uncomplicated: Secondary | ICD-10-CM | POA: Diagnosis present

## 2018-09-28 DIAGNOSIS — Z888 Allergy status to other drugs, medicaments and biological substances status: Secondary | ICD-10-CM

## 2018-09-28 DIAGNOSIS — Z981 Arthrodesis status: Secondary | ICD-10-CM

## 2018-09-28 DIAGNOSIS — G4700 Insomnia, unspecified: Secondary | ICD-10-CM | POA: Diagnosis present

## 2018-09-28 DIAGNOSIS — M4802 Spinal stenosis, cervical region: Secondary | ICD-10-CM | POA: Diagnosis present

## 2018-09-28 DIAGNOSIS — Z818 Family history of other mental and behavioral disorders: Secondary | ICD-10-CM

## 2018-09-28 DIAGNOSIS — Z8249 Family history of ischemic heart disease and other diseases of the circulatory system: Secondary | ICD-10-CM

## 2018-09-28 DIAGNOSIS — Z803 Family history of malignant neoplasm of breast: Secondary | ICD-10-CM

## 2018-09-28 DIAGNOSIS — N189 Chronic kidney disease, unspecified: Secondary | ICD-10-CM | POA: Diagnosis present

## 2018-09-28 DIAGNOSIS — E785 Hyperlipidemia, unspecified: Secondary | ICD-10-CM | POA: Diagnosis present

## 2018-09-28 DIAGNOSIS — R402142 Coma scale, eyes open, spontaneous, at arrival to emergency department: Secondary | ICD-10-CM | POA: Diagnosis present

## 2018-09-28 DIAGNOSIS — E1122 Type 2 diabetes mellitus with diabetic chronic kidney disease: Secondary | ICD-10-CM | POA: Diagnosis present

## 2018-09-28 DIAGNOSIS — G8250 Quadriplegia, unspecified: Secondary | ICD-10-CM | POA: Diagnosis present

## 2018-09-28 DIAGNOSIS — Z419 Encounter for procedure for purposes other than remedying health state, unspecified: Secondary | ICD-10-CM

## 2018-09-28 DIAGNOSIS — I129 Hypertensive chronic kidney disease with stage 1 through stage 4 chronic kidney disease, or unspecified chronic kidney disease: Secondary | ICD-10-CM | POA: Diagnosis present

## 2018-09-28 DIAGNOSIS — R402252 Coma scale, best verbal response, oriented, at arrival to emergency department: Secondary | ICD-10-CM | POA: Diagnosis present

## 2018-09-28 DIAGNOSIS — Z8049 Family history of malignant neoplasm of other genital organs: Secondary | ICD-10-CM

## 2018-09-28 DIAGNOSIS — Z833 Family history of diabetes mellitus: Secondary | ICD-10-CM

## 2018-09-28 DIAGNOSIS — M797 Fibromyalgia: Secondary | ICD-10-CM | POA: Diagnosis present

## 2018-09-28 DIAGNOSIS — Z8261 Family history of arthritis: Secondary | ICD-10-CM

## 2018-09-28 DIAGNOSIS — E1140 Type 2 diabetes mellitus with diabetic neuropathy, unspecified: Secondary | ICD-10-CM

## 2018-09-28 DIAGNOSIS — F418 Other specified anxiety disorders: Secondary | ICD-10-CM | POA: Diagnosis present

## 2018-09-28 DIAGNOSIS — Z8349 Family history of other endocrine, nutritional and metabolic diseases: Secondary | ICD-10-CM

## 2018-09-28 DIAGNOSIS — R402362 Coma scale, best motor response, obeys commands, at arrival to emergency department: Secondary | ICD-10-CM | POA: Diagnosis present

## 2018-09-28 DIAGNOSIS — Z7902 Long term (current) use of antithrombotics/antiplatelets: Secondary | ICD-10-CM

## 2018-09-28 LAB — GLUCOSE, CAPILLARY
GLUCOSE-CAPILLARY: 286 mg/dL — AB (ref 70–99)
Glucose-Capillary: 331 mg/dL — ABNORMAL HIGH (ref 70–99)
Glucose-Capillary: 94 mg/dL (ref 70–99)

## 2018-09-28 LAB — HEMOGLOBIN A1C
HEMOGLOBIN A1C: 10.2 % — AB (ref 4.8–5.6)
Mean Plasma Glucose: 246.04 mg/dL

## 2018-09-28 SURGERY — ANTERIOR CERVICAL DECOMPRESSION/DISCECTOMY FUSION 1 LEVEL
Anesthesia: General

## 2018-09-28 MED ORDER — FENTANYL CITRATE (PF) 250 MCG/5ML IJ SOLN
INTRAMUSCULAR | Status: AC
  Filled 2018-09-28: qty 5

## 2018-09-28 MED ORDER — ALUM & MAG HYDROXIDE-SIMETH 200-200-20 MG/5ML PO SUSP: 30.0000 mL | Freq: Four times a day (QID) | ORAL | Status: DC | PRN

## 2018-09-28 MED ORDER — PANTOPRAZOLE SODIUM 40 MG IV SOLR
40.0000 mg | Freq: Every day | INTRAVENOUS | Status: DC
  Administered 2018-09-28: 40 mg via INTRAVENOUS
  Filled 2018-09-28: qty 40

## 2018-09-28 MED ORDER — TRAZODONE HCL 150 MG PO TABS
150.0000 mg | ORAL_TABLET | Freq: Every day | ORAL | Status: DC
  Administered 2018-09-28 – 2018-10-02 (×5): 150 mg via ORAL
  Filled 2018-09-28 (×5): qty 1

## 2018-09-28 MED ORDER — GABAPENTIN 100 MG PO CAPS
100.0000 mg | ORAL_CAPSULE | Freq: Three times a day (TID) | ORAL | Status: DC
  Administered 2018-09-28 – 2018-10-03 (×14): 100 mg via ORAL
  Filled 2018-09-28 (×14): qty 1

## 2018-09-28 MED ORDER — MENTHOL 3 MG MT LOZG: 1.0000 | LOZENGE | OROMUCOSAL | Status: DC | PRN

## 2018-09-28 MED ORDER — ONETOUCH VERIO W/DEVICE KIT: 1.0000 | PACK | Freq: Three times a day (TID) | Status: DC

## 2018-09-28 MED ORDER — DEXAMETHASONE SODIUM PHOSPHATE 10 MG/ML IJ SOLN
INTRAMUSCULAR | Status: AC
  Filled 2018-09-28: qty 1

## 2018-09-28 MED ORDER — CLONIDINE HCL 0.1 MG PO TABS
0.1000 mg | ORAL_TABLET | Freq: Every day | ORAL | Status: DC
  Administered 2018-09-29 – 2018-10-01 (×3): 0.1 mg via ORAL
  Filled 2018-09-28 (×4): qty 1

## 2018-09-28 MED ORDER — CYCLOBENZAPRINE HCL 10 MG PO TABS
10.0000 mg | ORAL_TABLET | Freq: Three times a day (TID) | ORAL | Status: DC | PRN
  Administered 2018-10-02 – 2018-10-03 (×3): 10 mg via ORAL
  Filled 2018-09-28 (×7): qty 1

## 2018-09-28 MED ORDER — INSULIN ASPART 100 UNIT/ML ~~LOC~~ SOLN
25.0000 [IU] | Freq: Three times a day (TID) | SUBCUTANEOUS | Status: DC
  Administered 2018-09-28 – 2018-10-01 (×10): 25 [IU] via SUBCUTANEOUS
  Filled 2018-09-28: qty 0.25

## 2018-09-28 MED ORDER — SODIUM CHLORIDE 0.9 % IV SOLN: 250.0000 mL | INTRAVENOUS | Status: DC

## 2018-09-28 MED ORDER — LACTATED RINGERS IV SOLN
INTRAVENOUS | Status: DC
  Administered 2018-09-28 (×2): via INTRAVENOUS

## 2018-09-28 MED ORDER — SERTRALINE HCL 100 MG PO TABS
200.0000 mg | ORAL_TABLET | Freq: Every day | ORAL | Status: DC
  Administered 2018-09-28 – 2018-10-02 (×5): 200 mg via ORAL
  Filled 2018-09-28 (×5): qty 2

## 2018-09-28 MED ORDER — ACETAMINOPHEN 325 MG PO TABS
650.0000 mg | ORAL_TABLET | ORAL | Status: DC | PRN
  Administered 2018-10-02 – 2018-10-03 (×2): 650 mg via ORAL
  Filled 2018-09-28 (×3): qty 2

## 2018-09-28 MED ORDER — SODIUM CHLORIDE 0.9% FLUSH
3.0000 mL | Freq: Two times a day (BID) | INTRAVENOUS | Status: DC
  Administered 2018-09-28 – 2018-10-02 (×7): 3 mL via INTRAVENOUS

## 2018-09-28 MED ORDER — DEXAMETHASONE SODIUM PHOSPHATE 10 MG/ML IJ SOLN: 10.0000 mg | INTRAMUSCULAR | Status: DC

## 2018-09-28 MED ORDER — CHLORHEXIDINE GLUCONATE CLOTH 2 % EX PADS: 6.0000 | MEDICATED_PAD | Freq: Once | CUTANEOUS | Status: DC

## 2018-09-28 MED ORDER — MIDAZOLAM HCL 2 MG/2ML IJ SOLN
INTRAMUSCULAR | Status: AC
  Filled 2018-09-28: qty 2

## 2018-09-28 MED ORDER — CEFAZOLIN SODIUM-DEXTROSE 2-4 GM/100ML-% IV SOLN: 2.0000 g | INTRAVENOUS | Status: DC

## 2018-09-28 MED ORDER — ACETAMINOPHEN 650 MG RE SUPP: 650.0000 mg | RECTAL | Status: DC | PRN

## 2018-09-28 MED ORDER — PHENOL 1.4 % MT LIQD: 1.0000 | OROMUCOSAL | Status: DC | PRN

## 2018-09-28 MED ORDER — SODIUM CHLORIDE 0.9% FLUSH
3.0000 mL | INTRAVENOUS | Status: DC | PRN
  Administered 2018-10-03 (×2): 3 mL via INTRAVENOUS
  Filled 2018-09-28 (×2): qty 3

## 2018-09-28 MED ORDER — DEXAMETHASONE SODIUM PHOSPHATE 10 MG/ML IJ SOLN
10.0000 mg | Freq: Four times a day (QID) | INTRAMUSCULAR | Status: DC
  Administered 2018-09-28 – 2018-10-03 (×19): 10 mg via INTRAVENOUS
  Filled 2018-09-28 (×19): qty 1

## 2018-09-28 MED ORDER — ATORVASTATIN CALCIUM 10 MG PO TABS
10.0000 mg | ORAL_TABLET | Freq: Every day | ORAL | Status: DC
  Administered 2018-09-28 – 2018-10-02 (×5): 10 mg via ORAL
  Filled 2018-09-28 (×5): qty 1

## 2018-09-28 MED ORDER — HYDROMORPHONE HCL 1 MG/ML IJ SOLN: 1.0000 mg | INTRAMUSCULAR | Status: DC | PRN

## 2018-09-28 MED ORDER — INSULIN ASPART 100 UNIT/ML ~~LOC~~ SOLN
0.0000 [IU] | Freq: Three times a day (TID) | SUBCUTANEOUS | Status: DC
  Administered 2018-09-28: 11 [IU] via SUBCUTANEOUS
  Administered 2018-09-29: 5 [IU] via SUBCUTANEOUS
  Administered 2018-09-29: 8 [IU] via SUBCUTANEOUS
  Administered 2018-09-29: 11 [IU] via SUBCUTANEOUS
  Administered 2018-09-30: 8 [IU] via SUBCUTANEOUS
  Administered 2018-09-30: 11 [IU] via SUBCUTANEOUS
  Administered 2018-09-30: 8 [IU] via SUBCUTANEOUS
  Administered 2018-10-01 (×2): 15 [IU] via SUBCUTANEOUS
  Administered 2018-10-02 (×2): 5 [IU] via SUBCUTANEOUS
  Administered 2018-10-03: 11 [IU] via SUBCUTANEOUS
  Administered 2018-10-03: 8 [IU] via SUBCUTANEOUS

## 2018-09-28 MED ORDER — ONDANSETRON 4 MG PO TBDP: 4.0000 mg | ORAL_TABLET | Freq: Three times a day (TID) | ORAL | Status: DC | PRN

## 2018-09-28 MED ORDER — OXYCODONE HCL 5 MG PO TABS
10.0000 mg | ORAL_TABLET | ORAL | Status: DC | PRN
  Administered 2018-09-28 – 2018-10-03 (×13): 10 mg via ORAL
  Filled 2018-09-28 (×15): qty 2

## 2018-09-28 MED ORDER — CYCLOBENZAPRINE HCL 10 MG PO TABS
10.0000 mg | ORAL_TABLET | Freq: Three times a day (TID) | ORAL | Status: DC | PRN
  Administered 2018-09-28 – 2018-10-01 (×3): 10 mg via ORAL

## 2018-09-28 MED ORDER — ONDANSETRON HCL 4 MG/2ML IJ SOLN
4.0000 mg | Freq: Four times a day (QID) | INTRAMUSCULAR | Status: DC | PRN
  Administered 2018-10-02: 4 mg via INTRAVENOUS

## 2018-09-28 MED ORDER — CEFAZOLIN SODIUM-DEXTROSE 2-4 GM/100ML-% IV SOLN
INTRAVENOUS | Status: AC
  Filled 2018-09-28: qty 100

## 2018-09-28 MED ORDER — PROPOFOL 10 MG/ML IV BOLUS
INTRAVENOUS | Status: AC
  Filled 2018-09-28: qty 20

## 2018-09-28 MED ORDER — ONDANSETRON HCL 4 MG PO TABS: 4.0000 mg | ORAL_TABLET | Freq: Four times a day (QID) | ORAL | Status: DC | PRN

## 2018-09-28 NOTE — Social Work (Signed)
CSW acknowledging consult for SNF placement. Will follow for therapy recommendations. Pt currently listed as uninsured.   Meghan Park, MSW, [facility] Clinical Social Work [phone]

## 2018-09-28 NOTE — H&P (Signed)
Meghan Park is an 48 y.o. female.   Chief Complaint: weakness and numbness HPI: 48 year old female with history of previous ACDF C5-C7 performed by Dr. Cyndy Freeze was 2 years ago presented with 2 weeksof tingling numbness shocking feelings in her arms and hands and she has sustained a couple falls was ambulating with a walker and then had to transition to a wheelchair 5 days ago. 20 year last night outside emergency room at [facility] and was noted to have upper and lower extremity weakness MRI obtained showed severe spinal stenosis cord compression and signal change within the cord behind C3-4. Patient was transferred here for operative management.however upon arrival patient is aware she is on Plavix and took her last dose a day and a half ago. Her exam appears to be stable over the last 5 days and due to the high risk of postoperative bleeding complications on Plavix for cervical disc surgery I recommended admitting the patient place her on IV steroids and rescheduling for Monday giving her the recommended 5-7 days off of Plavix. I did discuss this extensively with a pharmacy discussing half lives and antiplatelet effects of Plavix. I extensively went over this with the patient and family as well as a surgery perioperative course expectations of outcome and alternatives and she understands and agrees to proceed forward.  Past Medical History:  Diagnosis Date  . Adrenal insufficiency ([place])   . Anal fissure   . Anxiety   . Anxiety associated with depression   . Arthritis    cervical spine , R- hip   . Cholelithiasis   . Chronic headaches   . Chronic kidney disease   . Cystitis   . Depression   . Diabetes mellitus, type II ([place])   . DM (diabetes mellitus), type 2, uncontrolled ([place])   . Fatty liver   . Fibromyalgia   . Gastroparesis   . H pylori ulcer   . Hip pain, right   . History of hiatal hernia   . HLD (hyperlipidemia)   . Hypertension    2018- taken off cardiac med., BP wnl   . Insomnia   . Nausea   . Neuromuscular disorder (H[facility])    neuropathy- feet , hi-tone pelvic floor disorder   . Pneumonia hx  . Pneumonia     Past Surgical History:  Procedure Laterality Date  . ABDOMINAL HYSTERECTOMY  2012  . ANTERIOR CERVICAL DECOMP/DISCECTOMY FUSION N/A 01/27/2017   Procedure: ANTERIOR CERVICAL DECOMPRESSION/DISCECTOMY FUSION CERVICAL FIVE - CERVICAL SIX  CERVICAL SIX - CERVICAL SEVEN;  Surgeon: Ashok Pall, MD;  Location: [facility];  Service: Neurosurgery;  Laterality: N/A;  . CESAREAN SECTION  [id number]   x2  . CHOLECYSTECTOMY  04/13/12  . DIAGNOSTIC LAPAROSCOPY  2010  . ERCP  04/24/2012   Procedure: ENDOSCOPIC RETROGRADE CHOLANGIOPANCREATOGRAPHY (ERCP);  Surgeon: Beryle Beams, MD;  Location: [facility];  Service: Endoscopy;  Laterality: N/A;  . ERCP  06/02/2012   Procedure: ENDOSCOPIC RETROGRADE CHOLANGIOPANCREATOGRAPHY (ERCP);  Surgeon: Beryle Beams, MD;  Location: [facility];  Service: Endoscopy;  Laterality: N/A;  . GALLBLADDER SURGERY  2013  . TUBAL LIGATION      Family History  Problem Relation Age of Onset  . Anxiety disorder Mother   . Hyperlipidemia Mother   . Irritable bowel syndrome Mother   . Arthritis Father   . COPD Father   . Heart disease Father   . Heart disease Maternal Grandfather   . Congestive Heart Failure Maternal Grandmother   .  Heart disease Maternal Grandmother   . Heart disease Paternal Grandfather   . Breast cancer Paternal Grandmother        great  . Anxiety disorder Son        social anxiety  . Diabetes Maternal Aunt   . Alcohol abuse Maternal Uncle   . Uterine cancer Maternal Aunt        great  . Colon cancer Neg Hx   . Other Neg Hx        hyponatremia   Social History:  reports that she has been smoking cigarettes. She has a 23.00 pack-year smoking history. She has never used smokeless tobacco. She reports that she does not drink alcohol or use drugs.  Allergies:  Allergies  Allergen Reactions  .  Buprenorphine Hcl Dermatitis    Other reaction(s): Other (See Comments) Severe itching leading to welts and open wounds.  Celesta Gentile [Sitagliptin] Other (See Comments)    GI upset  . Morphine And Related Dermatitis and Other (See Comments)    Severe itching leading to welts and open wounds.  Nada Libman [Promethazine Hcl] Other (See Comments)    TD  . Promethazine Other (See Comments)    Other reaction(s): Other (See Comments) TD  . Reglan [Metoclopramide] Other (See Comments)    Other reaction(s): Other (See Comments), Tremor (intolerance) Tremors, jittery And headache Tremors, jittery  . Wilder Glade [Dapagliflozin] Other (See Comments)    yeast    Medications Prior to Admission  Medication Sig Dispense Refill  . atorvastatin (LIPITOR) 10 MG tablet Take 10 mg by mouth daily at 6 PM.    . Blood Glucose Monitoring Suppl (ONETOUCH VERIO) W/DEVICE KIT 1 Act by Does not apply route 3 (three) times daily. 2 kit 0  . clopidogrel (PLAVIX) 75 MG tablet Take 75 mg by mouth daily.    . cyclobenzaprine (FLEXERIL) 10 MG tablet Take 10 mg by mouth.    . gabapentin (NEURONTIN) 100 MG capsule Take 1 capsule (100 mg total) by mouth 3 (three) times daily. 90 capsule 11  . glucose blood test strip Use TID 100 each 12  . insulin lispro (HUMALOG KWIKPEN) 100 UNIT/ML KiwkPen Inject 0.25 mLs (25 Units total) into the skin 3 (three) times daily. 30 mL 3  . ondansetron (ZOFRAN-ODT) 8 MG disintegrating tablet   10  . sertraline (ZOLOFT) 100 MG tablet Take 2 tablets (200 mg total) by mouth daily. 180 tablet 1  . traZODone (DESYREL) 150 MG tablet Take by mouth at bedtime.    . clonazePAM (KLONOPIN) 1 MG tablet TAKE 1 TABLET BY MOUTH TWICE DAILY AS NEEDED FOR ANXIETY 60 tablet 2  . cloNIDine (CATAPRES) 0.1 MG tablet Take 1 tablet (0.1 mg total) by mouth 2 (two) times daily as needed. (Patient not taking: Reported on 07/06/2017) 60 tablet 1  . cyanocobalamin 2000 MCG tablet Take 1 tablet (2,000 mcg total) by mouth  daily. (Patient not taking: Reported on 07/06/2017) 90 tablet 3  . insulin degludec (TRESIBA FLEXTOUCH) 100 UNIT/ML SOPN FlexTouch Pen Inject 0.5 mLs (50 Units total) into the skin daily at 10 pm. 3 mL 11  . metoprolol succinate (TOPROL-XL) 25 MG 24 hr tablet Take 1 tablet (25 mg total) by mouth daily. (Patient not taking: Reported on 07/06/2017) 90 tablet 3  . sucralfate (CARAFATE) 1 g tablet Take 1 g by mouth 4 (four) times daily -  with meals and at bedtime.      Results for orders placed or performed during the hospital encounter  of 09/28/18 (from the past 48 hour(s))  Glucose, capillary     Status: Abnormal   Collection Time: 09/28/18 11:27 AM  Result Value Ref Range   Glucose-Capillary 286 (H) 70 - 99 mg/dL   No results found.  Review of Systems  Musculoskeletal: Positive for neck pain.  Neurological: Positive for tingling, sensory change and weakness.    Blood pressure 120/76, pulse 88, temperature 98.1 F (36.7 C), temperature source Oral, resp. rate 18, height 5' 2.5" (1.588 m), weight 85.3 kg, SpO2 99 %. Physical Exam  Constitutional: She is oriented to person, place, and time. She appears well-developed.  HENT:  Head: Normocephalic.  Eyes: Pupils are equal, round, and reactive to light.  Neck: Normal range of motion.  Cardiovascular: Normal rate.  Respiratory: Effort normal.  GI: Soft. Bowel sounds are normal.  Neurological: She is alert and oriented to person, place, and time. She has normal strength. GCS eye subscore is 4. GCS verbal subscore is 5. GCS motor subscore is 6.  Patient is awake alert for range motion of her neck with a positive L'hermittes in extensionstrength is 45 deltoid right upper extremity has 4 out of 5 biceps 4+ out of 5 triceps and grips bilaterally are 4-4+ out of 5 left biceps triceps in the stronger 4+ out of 5. Lower extremity strength appears to be 5 out of 5 she has decreased sensation in both arms hands and legs.  Skin: Skin is warm and dry.      Assessment/Pla 48 year female with be admitted for IV steroids preoperative management leading into anterior cervical disc surgery on Monday.  Shirleen Mcfaul P, MD 09/28/2018, 12:05 PM

## 2018-09-28 NOTE — Anesthesia Preprocedure Evaluation (Signed)
Anesthesia Evaluation  Patient identified by MRN, date of birth, ID band Patient awake    Reviewed: Allergy & Precautions, NPO status , Patient's Chart, lab work & pertinent test results  Airway Mallampati: II  TM Distance: >3 FB     Dental   Pulmonary pneumonia, COPD, Current Smoker,    breath sounds clear to auscultation       Cardiovascular hypertension,  Rhythm:Regular Rate:Normal     Neuro/Psych    GI/Hepatic hiatal hernia, PUD,   Endo/Other  diabetes  Renal/GU Renal disease     Musculoskeletal  (+) Arthritis , Fibromyalgia -  Abdominal   Peds  Hematology  (+) anemia ,   Anesthesia Other Findings   Reproductive/Obstetrics                             Anesthesia Physical Anesthesia Plan  ASA: III  Anesthesia Plan: General   Post-op Pain Management:    Induction: Intravenous  PONV Risk Score and Plan: Ondansetron, Dexamethasone, Midazolam and Treatment may vary due to age or medical condition  Airway Management Planned: Oral ETT  Additional Equipment:   Intra-op Plan:   Post-operative Plan:   Informed Consent: I have reviewed the patients History and Physical, chart, labs and discussed the procedure including the risks, benefits and alternatives for the proposed anesthesia with the patient or authorized representative who has indicated his/her understanding and acceptance.     Dental advisory given  Plan Discussed with: CRNA and Anesthesiologist  Anesthesia Plan Comments:         Anesthesia Quick Evaluation

## 2018-09-28 NOTE — Progress Notes (Signed)
Attempted to call 4N 3 times- 1313; 1335 and 1340. Initially spoke with I[facility] Drape and was told they did not know they were getting another patient and he needed to "relay that to the nurse" before I could give report. No answer at nurses station following 2 times I called. Will transport patient and give bedside report.

## 2018-09-29 LAB — SURGICAL PCR SCREEN
MRSA, PCR: NEGATIVE
Staphylococcus aureus: NEGATIVE

## 2018-09-29 LAB — GLUCOSE, CAPILLARY
Glucose-Capillary: 274 mg/dL — ABNORMAL HIGH (ref 70–99)
Glucose-Capillary: 209 mg/dL — ABNORMAL HIGH (ref 70–99)
Glucose-Capillary: 328 mg/dL — ABNORMAL HIGH (ref 70–99)
Glucose-Capillary: 282 mg/dL — ABNORMAL HIGH (ref 70–99)

## 2018-09-29 MED ORDER — PANTOPRAZOLE SODIUM 40 MG PO TBEC
40.0000 mg | DELAYED_RELEASE_TABLET | Freq: Every day | ORAL | Status: DC
  Administered 2018-09-30 – 2018-10-03 (×3): 40 mg via ORAL
  Filled 2018-09-29 (×3): qty 1

## 2018-09-29 MED ORDER — MUPIROCIN 2 % EX OINT: 1.0000 "application " | TOPICAL_OINTMENT | Freq: Two times a day (BID) | CUTANEOUS | Status: DC

## 2018-09-29 NOTE — Evaluation (Signed)
Physical Therapy Evaluation Patient Details Name: Meghan Park MRN: [id number] DOB: 1970/11/03 Today's Date: 09/29/2018   History of Present Illness  48 yo female admitted for preop management prior to ACDF 10/02/18. Pt with history of 2 weeks of tingling and numbness in arms and hands and multiple falls, ambulated with RW with recent transition to [facility] due to weakness with C3-4cord compression. PMH: ACDF C5-7, anxiety, depression, arthritis, type 2 DM, HTN, fibromyalgia, neuropathy  Clinical Impression  Pt pleasant and in chair on arrival. Pt eager to participate with therapy. Pt presents with decreased strength, balance and activity tolerance limiting her ability to perform all daily activities and her caregiver duties at home. Pt reports 4 falls in the last week secondary to LE weakness. Pt would benefit from skilled therapy to improve strength, endurance and functional mobility to allow for safe return home and to her caregiver duties. Pt educated on plan to follow acutely and for therapy post discharge from the hospital and patient consented.     Follow Up Recommendations Home health PT;Supervision for mobility/OOB    Equipment Recommendations  None recommended by PT    Recommendations for Other Services OT consult     Precautions / Restrictions Precautions Precautions: Fall Precaution Comments: 4 recent falls Restrictions Weight Bearing Restrictions: No      Mobility  Bed Mobility               General bed mobility comments: in chair on arrival  Transfers Overall transfer level: Needs assistance   Transfers: Sit to/from Stand Sit to Stand: Min guard         General transfer comment: from recliner x 2 cues for hand placement  Ambulation/Gait Ambulation/Gait assistance: Min guard;+2 safety/equipment Gait Distance (Feet): 50 Feet Assistive device: Rolling walker (2 wheeled) Gait Pattern/deviations: Trunk flexed;Step-through pattern;Decreased stride length   Gait  velocity interpretation: <1.8 ft/sec, indicate of risk for recurrent falls General Gait Details: pt with slow cautious gait with reports of feeling LLE will give out during gait. Close chair follow with pt walking 2trials of 50' with seated rest  Stairs            Wheelchair Mobility    Modified Rankin (Stroke Patients Only)       Balance Overall balance assessment: History of Falls                                           Pertinent Vitals/Pain Pain Assessment: 0-10 Pain Score: 5  Pain Location: BLEs  quads, BUEs Pain Descriptors / Indicators: Aching Pain Intervention(s): Repositioned;Limited activity within patient's tolerance    Home Living Family/patient expects to be discharged to:: Private residence Living Arrangements: Spouse/significant other Available Help at Discharge: Family Type of Home: House Home Access: Level entry     Home Layout: One level Home Equipment: Grab bars - tub/shower;Shower seat;Walker - 2 wheels Additional Comments: lift chair. spouse with [place] and pt is his caregiver at supervision level and does all the homemaking, pt borrowing a WC due to 4 falls in the last week    Prior Function Level of Independence: Independent         Comments: pt takes care of 3 dogs as well     Hand Dominance   Dominant Hand: Right    Extremity/Trunk Assessment   Upper Extremity Assessment Upper Extremity Assessment: RUE deficits/detail;LUE deficits/detail RUE Deficits /  Details: 3/5 grip, shoulder flexion 90 degrees, decreased wrist extension, 3/5 elbow flexion and extension LUE Deficits / Details: 3/5 grip    Lower Extremity Assessment Lower Extremity Assessment: RLE deficits/detail;LLE deficits/detail RLE Deficits / Details: 3/5 hip flexion, knee extension 3+/5, knee flexion 3+/5, dorsiflexion 4/5 LLE Deficits / Details: 3/5 hip flexion, knee extension 3+/5  , knee flexion 3/5, dorsiflexion 3/5    Cervical / Trunk  Assessment Cervical / Trunk Assessment: Other exceptions Cervical / Trunk Exceptions: forward head  Communication   Communication: No difficulties  Cognition Arousal/Alertness: Awake/alert Behavior During Therapy: WFL for tasks assessed/performed Overall Cognitive Status: Within Functional Limits for tasks assessed                                        General Comments      Exercises     Assessment/Plan    PT Assessment Patient needs continued PT services  PT Problem List Decreased strength;Decreased balance;Decreased mobility;Decreased knowledge of use of DME;Decreased activity tolerance;Decreased coordination;Decreased safety awareness       PT Treatment Interventions Gait training;Therapeutic activities;Therapeutic exercise;Neuromuscular re-education;Patient/family education;Balance training;Functional mobility training;DME instruction    PT Goals (Current goals can be found in the Care Plan section)  Acute Rehab PT Goals Patient Stated Goal: return home PT Goal Formulation: With patient Time For Goal Achievement: 10/13/18 Potential to Achieve Goals: Good    Frequency Min 3X/week   Barriers to discharge Decreased caregiver support      Co-evaluation               AM-PAC PT "6 Clicks" Mobility  Outcome Measure Help needed turning from your back to your side while in a flat bed without using bedrails?: A Little Help needed moving from lying on your back to sitting on the side of a flat bed without using bedrails?: A Little Help needed moving to and from a bed to a chair (including a wheelchair)?: A Little Help needed standing up from a chair using your arms (e.g., wheelchair or bedside chair)?: A Little Help needed to walk in hospital room?: A Little Help needed climbing 3-5 steps with a railing? : A Lot 6 Click Score: 17    End of Session Equipment Utilized During Treatment: Gait belt Activity Tolerance: Patient tolerated treatment  well Patient left: in chair;with call bell/phone within reach Nurse Communication: Mobility status PT Visit Diagnosis: Other abnormalities of gait and mobility (R26.89);Muscle weakness (generalized) (M62.81);History of falling (Z91.81)    Time: 3329-5188 PT Time Calculation (min) (ACUTE ONLY): 27 min   Charges:   PT Evaluation $PT Eval Moderate Complexity: 1 Mod PT Treatments $Gait Training: 8-22 mins        Gagan Dillion, [place] [phone]   Harlan Ervine 09/29/2018, 10:40 AM

## 2018-09-29 NOTE — Progress Notes (Signed)
Noted bandage to left foot at toes. Patient describes this occurring prior to admission and it is a scratch. No drainage noted to area and dressing intact.

## 2018-09-29 NOTE — Consult Note (Signed)
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  She admits to using a pumice stone, resulting in open wound.  Wound is not nonintact and calloused to periwound Wound type: neuropathic Pressure Injury POA: Yes Measurement: 2 cm x 2 cm callous with 0.4 cm opening.  Wound bed visible is pale pink and macered Wound bed:see above Drainage (amount, consistency, odor) none noted Periwound:calloused, yelllow and dry Dressing procedure/placement/frequency: Cleanse left plantar foot wound with NS and pat dry. FIll wound depth with Iodoform packing strip.  Cover with padded foam dressing.  Change daily.  Will not follow at this time.  Please re-consult if needed.  Maple Hudson MSN, RN, FNP-BC CWON Wound, Ostomy, Continence Nurse Pager [phone]

## 2018-09-29 NOTE — Progress Notes (Signed)
Subjective: Patient reports patient will well remains neurologically stable with weakness in her hands and arms and difficulty walking ...no worse  Objective: Vital signs in last 24 hours: Temp:  [97.5 F (36.4 C)-98.5 F (36.9 C)] 97.5 F (36.4 C) (03/06 1535) Pulse Rate:  [58-86] 81 (03/06 1535) Resp:  [16-22] 16 (03/06 1535) BP: (109-135)/(76-92) 115/80 (03/06 1535) SpO2:  [95 %-100 %] 100 % (03/06 1535)  Intake/Output from previous day: No intake/output data recorded. Intake/Output this shift: Total I/O In: 240 [P.O.:240] Out: -   continue on IV Decadron and keep her off her Plavix for the weekend surgery planned for Monday  Lab Results: No results for input(s): WBC, HGB, HCT, PLT in the last 72 hours. BMET No results for input(s): NA, K, CL, CO2, GLUCOSE, BUN, CREATININE, CALCIUM in the last 72 hours.  Studies/Results: No results found.  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

## 2018-09-29 NOTE — Evaluation (Signed)
Occupational Therapy Evaluation Patient Details Name: Meghan Park MRN: [id number] DOB: 08-10-1970 Today's Date: 09/29/2018    History of Present Illness 48 yo female admitted for preop management prior to ACDF 10/02/18. Pt with history of 2 weeks of tingling and numbness in arms and hands and multiple falls, ambulated with RW with recent transition to [facility] due to weakness with C3-4cord compression. 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 Pt started dropping things with BUEs and poor fine motor coordination.  Pt currently, limited by pain in low back and BLE soreness towards hips. Pt education provided on BLE AE and pt performing most LB ADL with figure 4 technique. Pt set-upA for UB in sitting. Pt unsteady with RW in standing/ambulating requiring chair follow for safety. Pt's BUEs with poor coordination R is worse than LUE. Pt dropping items and requires increased time for finger to nose test. Pt doing well now, but may see changes in hands after surgery scheduled for 10/02/18. Pt would benefit from continued OT skilled services for ADL, mobility and safety in setting to be determined s/p surgery. OT to follow-up for discharge and AE needs.    Follow Up Recommendations  Follow surgeon's recommendation for DC plan and follow-up therapies;Other (comment)(to be determined after sx)    Equipment Recommendations  None recommended by OT    Recommendations for Other Services       Precautions / Restrictions Precautions Precautions: Fall Precaution Comments: 4 recent falls Restrictions Weight Bearing Restrictions: No      Mobility Bed Mobility               General bed mobility comments: in chair on arrival  Transfers Overall transfer level: Needs assistance Equipment used: Rolling walker (2 wheeled) Transfers: Sit to/from Stand Sit to Stand: Min guard          General transfer comment: from recliner x 2 cues for hand placement    Balance Overall balance assessment: History of Falls                                         ADL either performed or assessed with clinical judgement   ADL Overall ADL's : Needs assistance/impaired Eating/Feeding: Set up;Sitting   Grooming: Set up;Sitting   Upper Body Bathing: Minimal assistance;Sitting   Lower Body Bathing: Min guard;Sitting/lateral leans;Sit to/from stand   Upper Body Dressing : Set up;Sitting   Lower Body Dressing: Min guard;Sitting/lateral leans;Sit to/from stand Lower Body Dressing Details (indicate cue type and reason): AE reviewed and pt to haev sx on Monday so OTR requiring re-evaluation for issuing hip kit or not due to lack of insurance. Toilet Transfer: Min guard;Comfort height toilet;Grab bars   Toileting- Water quality scientist and Hygiene: Min guard;Sitting/lateral lean;Sit to/from stand       Functional mobility during ADLs: Min guard(knees give out so chair follow is necessary) General ADL Comments: Able to take increased time for figure 4 technique for LB dressing and set-upA for UB ADL     Vision Baseline Vision/History: No visual deficits Patient Visual Report: No change from baseline Vision Assessment?: No apparent visual deficits     Perception     Praxis      Pertinent Vitals/Pain Pain Assessment: 0-10 Pain Score: 5  Pain Location: BLEs  quads, BUEs  Pain Descriptors / Indicators: Aching Pain Intervention(s): Repositioned;Limited activity within patient's tolerance     Hand Dominance Right   Extremity/Trunk Assessment Upper Extremity Assessment Upper Extremity Assessment: Generalized weakness;RUE deficits/detail;LUE deficits/detail RUE Deficits / Details: Pt with 80-90* shoulder flex; elbow flex/ext, WFLs; 3+/5 mm grip strength RUE Sensation: WNL RUE Coordination: decreased fine motor;decreased gross motor(finger to nose- poor  result) LUE Deficits / Details: poor coordination; 3 to 3+/5 grip. LUE Sensation: decreased light touch;decreased proprioception LUE Coordination: decreased fine motor;decreased gross motor   Lower Extremity Assessment Lower Extremity Assessment: Generalized weakness;Defer to PT evaluation RLE Deficits / Details: 3/5 hip flexion, knee extension 3+/5, knee flexion 3+/5, dorsiflexion 4/5 LLE Deficits / Details: 3/5 hip flexion, knee extension 3+/5  , knee flexion 3/5, dorsiflexion 3/5   Cervical / Trunk Assessment Cervical / Trunk Assessment: Other exceptions Cervical / Trunk Exceptions: forward head   Communication Communication Communication: No difficulties   Cognition Arousal/Alertness: Awake/alert Behavior During Therapy: WFL for tasks assessed/performed Overall Cognitive Status: Within Functional Limits for tasks assessed                                     General Comments  Pt is caregiver for spouse and plans to resume at home with assist from children.    Exercises     Shoulder Instructions      Home Living Family/patient expects to be discharged to:: Private residence Living Arrangements: Spouse/significant other Available Help at Discharge: Family Type of Home: House Home Access: Level entry     Home Layout: One level     Bathroom Shower/Tub: Teacher, early years/pre: Standard     Home Equipment: Grab bars - tub/shower;Shower seat;Walker - 2 wheels   Additional Comments: lift chair. spouse with CA and pt is his caregiver at supervision level and does all the homemaking, pt borrowing a WC due to 4 falls in the last week      Prior Functioning/Environment Level of Independence: Independent        Comments: pt takes care of 3 dogs as well        OT Problem List: Decreased strength;Decreased activity tolerance;Impaired balance (sitting and/or standing);Decreased coordination;Decreased safety awareness;Pain;Increased edema       OT Treatment/Interventions: Self-care/ADL training;Therapeutic exercise;Neuromuscular education;DME and/or AE instruction;Therapeutic activities;Patient/family education;Balance training    OT Goals(Current goals can be found in the care plan section) Acute Rehab OT Goals Patient Stated Goal: return home OT Goal Formulation: With patient Time For Goal Achievement: 10/13/18 Potential to Achieve Goals: Good ADL Goals Pt Will Perform Lower Body Dressing: with modified independence;with adaptive equipment;sit to/from stand;sitting/lateral leans Pt Will Transfer to Toilet: with modified independence;regular height toilet Additional ADL Goal #1: Pt will perform transfers and functional mobility with modified independence using least restrictive device.  OT Frequency: Min 2X/week   Barriers to D/C:            Co-evaluation              AM-PAC OT "6 Clicks" Daily Activity     Outcome Measure Help from another person eating meals?: None Help from another person taking care of personal grooming?: None Help from another person toileting, which includes using toliet, bedpan, or urinal?: A Little Help from another person bathing (including washing, rinsing, drying)?: A Little Help from another person to put on and taking off regular upper body clothing?: None Help from another person  to put on and taking off regular lower body clothing?: A Little 6 Click Score: 21   End of Session Equipment Utilized During Treatment: Gait belt;Rolling walker Nurse Communication: Mobility status  Activity Tolerance: Patient tolerated treatment well Patient left: in chair;with call bell/phone within reach  OT Visit Diagnosis: Unsteadiness on feet (R26.81);Repeated falls (R29.6);Muscle weakness (generalized) (M62.81);Pain Pain - Right/Left: Right Pain - part of body: (back)                Time: 1032-1100 OT Time Calculation (min): 28 min Charges:  OT General Charges $OT Visit: 1 Visit OT  Evaluation $OT Eval Moderate Complexity: 1 Mod OT Treatments $Self Care/Home Management : 8-22 mins  Ebony Hail Harold Hedge) Marsa Aris OTR/L Acute Rehabilitation Services Pager: [phone] Office: [phone]   Fredda Hammed 09/29/2018, 2:11 PM

## 2018-09-30 LAB — GLUCOSE, CAPILLARY
GLUCOSE-CAPILLARY: 283 mg/dL — AB (ref 70–99)
Glucose-Capillary: 318 mg/dL — ABNORMAL HIGH (ref 70–99)
Glucose-Capillary: 300 mg/dL — ABNORMAL HIGH (ref 70–99)
Glucose-Capillary: 269 mg/dL — ABNORMAL HIGH (ref 70–99)

## 2018-09-30 NOTE — Progress Notes (Signed)
Overall stable.  No new issues or problems.  Exam unchanged.  Plan for surgery on Monday per Dr. Wynetta Emery.

## 2018-10-01 LAB — BASIC METABOLIC PANEL
Anion gap: 13 (ref 5–15)
BUN: 29 mg/dL — ABNORMAL HIGH (ref 6–20)
CO2: 19 mmol/L — ABNORMAL LOW (ref 22–32)
Calcium: 9.1 mg/dL (ref 8.9–10.3)
Chloride: 98 mmol/L (ref 98–111)
Creatinine, Ser: 0.88 mg/dL (ref 0.44–1.00)
GFR calc Af Amer: >60 mL/min (ref >60)
Glucose, Bld: 409 mg/dL — ABNORMAL HIGH (ref 70–99)
Potassium: 5.3 mmol/L — ABNORMAL HIGH (ref 3.5–5.1)
Sodium: 130 mmol/L — ABNORMAL LOW (ref 135–145)

## 2018-10-01 LAB — GLUCOSE, RANDOM: Glucose, Bld: 263 mg/dL — ABNORMAL HIGH (ref 70–99)

## 2018-10-01 LAB — CBC
HCT: 40.2 % (ref 36.0–46.0)
Hemoglobin: 13.4 g/dL (ref 12.0–15.0)
MCH: 29.7 pg (ref 26.0–34.0)
MCHC: 33.3 g/dL (ref 30.0–36.0)
MCV: 89.1 fL (ref 80.0–100.0)
Platelets: 214 10*3/uL (ref 150–400)
RBC: 4.51 MIL/uL (ref 3.87–5.11)
RDW: 12.9 % (ref 11.5–15.5)
WBC: 10.6 10*3/uL — ABNORMAL HIGH (ref 4.0–10.5)
nRBC: 0 % (ref 0.0–0.2)

## 2018-10-01 LAB — GLUCOSE, CAPILLARY
GLUCOSE-CAPILLARY: 432 mg/dL — AB (ref 70–99)
Glucose-Capillary: 377 mg/dL — ABNORMAL HIGH (ref 70–99)
Glucose-Capillary: 119 mg/dL — ABNORMAL HIGH (ref 70–99)
Glucose-Capillary: 276 mg/dL — ABNORMAL HIGH (ref 70–99)

## 2018-10-01 LAB — PROTIME-INR
INR: 1 (ref 0.8–1.2)
Prothrombin Time: 13.3 seconds (ref 11.4–15.2)

## 2018-10-01 LAB — APTT: aPTT: <20 seconds — ABNORMAL LOW (ref 24–36)

## 2018-10-01 NOTE — Progress Notes (Signed)
Neurosurgery Service Progress Note  Subjective: No acute events overnight.    Objective: Vitals:   09/30/18 1713 09/30/18 2100 09/30/18 2300 10/01/18 0300  BP: 116/80 128/82 120/76 132/79  Pulse: 83 76 72 78  Resp:  18 20 18   Temp: (!) 97.5 F (36.4 C) 97.8 F (36.6 C) 97.9 F (36.6 C) 97.7 F (36.5 C)  TempSrc: Oral Oral Oral Oral  SpO2: 100% 96% 97% 90%  Weight:      Height:       Temp (24hrs), Avg:97.7 F (36.5 C), Min:97.4 F (36.3 C), Max:97.9 F (36.6 C)  CBC Latest Ref Rng & Units 07/06/2017 01/20/2017 10/12/2016  WBC 4.0 - 10.5 K/uL 9.2 7.3 8.0  Hemoglobin 12.0 - 15.0 g/dL 17.3 56.7 01.4  Hematocrit 36.0 - 46.0 % 43.5 37.7 37.3  Platelets 150.0 - 400.0 K/uL 238.0 221 197.0   BMP Latest Ref Rng & Units 07/08/2017 01/20/2017 10/12/2016  Glucose 70 - 99 mg/dL 103(U) 131(Y) 388(I)  BUN 6 - 23 mg/dL 11 6 7   Creatinine 0.40 - 1.20 mg/dL 7.57 9.72 8.20  Sodium 135 - 145 mEq/L 134(L) 134(L) 132(L)  Potassium 3.5 - 5.1 mEq/L 4.1 3.4(L) 3.6  Chloride 96 - 112 mEq/L 99 103 99  CO2 19 - 32 mEq/L 27 23 25   Calcium 8.4 - 10.5 mg/dL 9.1 9.0 9.6   No intake or output data in the 24 hours ending 10/01/18 0944  Current Facility-Administered Medications:  .  0.9 %  sodium chloride infusion, 250 mL, Intravenous, Continuous, Donalee Citrin, MD .  acetaminophen (TYLENOL) tablet 650 mg, 650 mg, Oral, Q4H PRN **OR** acetaminophen (TYLENOL) suppository 650 mg, 650 mg, Rectal, Q4H PRN, Donalee Citrin, MD .  alum & mag hydroxide-simeth (MAALOX/MYLANTA) 200-200-20 MG/5ML suspension 30 mL, 30 mL, Oral, Q6H PRN, Donalee Citrin, MD .  atorvastatin (LIPITOR) tablet 10 mg, 10 mg, Oral, q1800, Donalee Citrin, MD, 10 mg at 09/30/18 1826 .  cloNIDine (CATAPRES) tablet 0.1 mg, 0.1 mg, Oral, Daily, Donalee Citrin, MD, 0.1 mg at 09/30/18 0921 .  cyclobenzaprine (FLEXERIL) tablet 10 mg, 10 mg, Oral, TID PRN, Donalee Citrin, MD .  cyclobenzaprine (FLEXERIL) tablet 10 mg, 10 mg, Oral, TID PRN, Donalee Citrin, MD, 10 mg at 09/29/18  2155 .  dexamethasone (DECADRON) injection 10 mg, 10 mg, Intravenous, Q6H, Donalee Citrin, MD, 10 mg at 10/01/18 0559 .  gabapentin (NEURONTIN) capsule 100 mg, 100 mg, Oral, TID, Donalee Citrin, MD, 100 mg at 09/30/18 2159 .  HYDROmorphone (DILAUDID) injection 1 mg, 1 mg, Intravenous, Q2H PRN, Donalee Citrin, MD .  insulin aspart (novoLOG) injection 0-15 Units, 0-15 Units, Subcutaneous, TID WC, Donalee Citrin, MD, 8 Units at 09/30/18 1827 .  insulin aspart (novoLOG) injection 25 Units, 25 Units, Subcutaneous, TID, Donalee Citrin, MD, 25 Units at 09/30/18 2200 .  menthol-cetylpyridinium (CEPACOL) lozenge 3 mg, 1 lozenge, Oral, PRN **OR** phenol (CHLORASEPTIC) mouth spray 1 spray, 1 spray, Mouth/Throat, PRN, Donalee Citrin, MD .  ondansetron (ZOFRAN) tablet 4 mg, 4 mg, Oral, Q6H PRN **OR** ondansetron (ZOFRAN) injection 4 mg, 4 mg, Intravenous, Q6H PRN, Donalee Citrin, MD .  ondansetron (ZOFRAN-ODT) disintegrating tablet 4 mg, 4 mg, Oral, Q8H PRN, Donalee Citrin, MD .  oxyCODONE (Oxy IR/ROXICODONE) immediate release tablet 10 mg, 10 mg, Oral, Q3H PRN, Donalee Citrin, MD, 10 mg at 10/01/18 0024 .  pantoprazole (PROTONIX) EC tablet 40 mg, 40 mg, Oral, Daily, Masters, Darl Householder, RPH, 40 mg at 09/30/18 6015 .  sertraline (ZOLOFT) tablet 200 mg, 200 mg, Oral,  Daily, Donalee Citrin, MD, 200 mg at 09/30/18 2159 .  sodium chloride flush (NS) 0.9 % injection 3 mL, 3 mL, Intravenous, Q12H, Donalee Citrin, MD, 3 mL at 09/30/18 0921 .  sodium chloride flush (NS) 0.9 % injection 3 mL, 3 mL, Intravenous, PRN, Donalee Citrin, MD .  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 -OR tomorrow, NPO p MN, preop labs ordered, still off plavix  Jadene Pierini  10/01/18 9:44 AM

## 2018-10-02 ENCOUNTER — Encounter (HOSPITAL_COMMUNITY)
Admission: AD | Disposition: A | Discharge status: Home Health | Payer: Self-pay | Source: Other Acute Inpatient Hospital | Attending: Neurosurgery

## 2018-10-02 ENCOUNTER — Inpatient Hospital Stay (HOSPITAL_COMMUNITY): Payer: Self-pay

## 2018-10-02 ENCOUNTER — Inpatient Hospital Stay (HOSPITAL_COMMUNITY): Payer: Self-pay | Admitting: Certified Registered Nurse Anesthetist

## 2018-10-02 HISTORY — PX: ANTERIOR CERVICAL DECOMP/DISCECTOMY FUSION: SHX1161

## 2018-10-02 LAB — GLUCOSE, CAPILLARY
GLUCOSE-CAPILLARY: 227 mg/dL — AB (ref 70–99)
Glucose-Capillary: 225 mg/dL — ABNORMAL HIGH (ref 70–99)
Glucose-Capillary: 180 mg/dL — ABNORMAL HIGH (ref 70–99)
Glucose-Capillary: 354 mg/dL — ABNORMAL HIGH (ref 70–99)

## 2018-10-02 SURGERY — ANTERIOR CERVICAL DECOMPRESSION/DISCECTOMY FUSION 1 LEVEL
Anesthesia: General | Site: Spine Cervical

## 2018-10-02 MED ORDER — MIDAZOLAM HCL 2 MG/2ML IJ SOLN
INTRAMUSCULAR | Status: DC | PRN
  Administered 2018-10-02: 2 mg via INTRAVENOUS

## 2018-10-02 MED ORDER — LACTATED RINGERS IV SOLN
INTRAVENOUS | Status: DC
  Administered 2018-10-02 (×2): via INTRAVENOUS

## 2018-10-02 MED ORDER — FENTANYL CITRATE (PF) 100 MCG/2ML IJ SOLN
25.0000 ug | INTRAMUSCULAR | Status: DC | PRN
  Administered 2018-10-02 (×3): 50 ug via INTRAVENOUS

## 2018-10-02 MED ORDER — MENTHOL 3 MG MT LOZG: 1.0000 | LOZENGE | OROMUCOSAL | Status: DC | PRN

## 2018-10-02 MED ORDER — SUCCINYLCHOLINE CHLORIDE 200 MG/10ML IV SOSY
PREFILLED_SYRINGE | INTRAVENOUS | Status: AC
  Filled 2018-10-02: qty 10

## 2018-10-02 MED ORDER — INSULIN ASPART 100 UNIT/ML ~~LOC~~ SOLN: 0.0000 [IU] | Freq: Three times a day (TID) | SUBCUTANEOUS | Status: DC

## 2018-10-02 MED ORDER — LACTATED RINGERS IV SOLN
INTRAVENOUS | Status: DC | PRN
  Administered 2018-10-02: 09:00:00 via INTRAVENOUS

## 2018-10-02 MED ORDER — SUGAMMADEX SODIUM 200 MG/2ML IV SOLN
INTRAVENOUS | Status: DC | PRN
  Administered 2018-10-02: 200 mg via INTRAVENOUS

## 2018-10-02 MED ORDER — FENTANYL CITRATE (PF) 250 MCG/5ML IJ SOLN
INTRAMUSCULAR | Status: DC | PRN
  Administered 2018-10-02: 100 ug via INTRAVENOUS
  Administered 2018-10-02 (×3): 50 ug via INTRAVENOUS

## 2018-10-02 MED ORDER — SODIUM CHLORIDE 0.9 % IV SOLN: 250.0000 mL | INTRAVENOUS | Status: DC

## 2018-10-02 MED ORDER — 0.9 % SODIUM CHLORIDE (POUR BTL) OPTIME
TOPICAL | Status: DC | PRN
  Administered 2018-10-02: 1000 mL

## 2018-10-02 MED ORDER — ROCURONIUM BROMIDE 10 MG/ML (PF) SYRINGE
PREFILLED_SYRINGE | INTRAVENOUS | Status: DC | PRN
  Administered 2018-10-02: 40 mg via INTRAVENOUS
  Administered 2018-10-02: 10 mg via INTRAVENOUS

## 2018-10-02 MED ORDER — MIDAZOLAM HCL 2 MG/2ML IJ SOLN
INTRAMUSCULAR | Status: AC
  Filled 2018-10-02: qty 2

## 2018-10-02 MED ORDER — LIDOCAINE 2% (20 MG/ML) 5 ML SYRINGE
INTRAMUSCULAR | Status: AC
  Filled 2018-10-02: qty 10

## 2018-10-02 MED ORDER — SUCCINYLCHOLINE CHLORIDE 200 MG/10ML IV SOSY
PREFILLED_SYRINGE | INTRAVENOUS | Status: DC | PRN
  Administered 2018-10-02: 100 mg via INTRAVENOUS

## 2018-10-02 MED ORDER — CEFAZOLIN SODIUM-DEXTROSE 2-4 GM/100ML-% IV SOLN
2.0000 g | Freq: Once | INTRAVENOUS | Status: AC
  Administered 2018-10-02: 2 g via INTRAVENOUS

## 2018-10-02 MED ORDER — FENTANYL CITRATE (PF) 100 MCG/2ML IJ SOLN
INTRAMUSCULAR | Status: AC
  Administered 2018-10-02: 50 ug via INTRAVENOUS
  Filled 2018-10-02: qty 2

## 2018-10-02 MED ORDER — THROMBIN 5000 UNITS EX SOLR
CUTANEOUS | Status: AC
  Filled 2018-10-02: qty 15000

## 2018-10-02 MED ORDER — PHENYLEPHRINE 40 MCG/ML (10ML) SYRINGE FOR IV PUSH (FOR BLOOD PRESSURE SUPPORT)
PREFILLED_SYRINGE | INTRAVENOUS | Status: AC
  Filled 2018-10-02: qty 10

## 2018-10-02 MED ORDER — SODIUM CHLORIDE 0.9% FLUSH
3.0000 mL | Freq: Two times a day (BID) | INTRAVENOUS | Status: DC
  Administered 2018-10-02: 3 mL via INTRAVENOUS

## 2018-10-02 MED ORDER — DEXAMETHASONE SODIUM PHOSPHATE 10 MG/ML IJ SOLN
INTRAMUSCULAR | Status: AC
  Filled 2018-10-02: qty 2

## 2018-10-02 MED ORDER — ONDANSETRON HCL 4 MG/2ML IJ SOLN
INTRAMUSCULAR | Status: AC
  Filled 2018-10-02: qty 4

## 2018-10-02 MED ORDER — SODIUM CHLORIDE 0.9% FLUSH: 3.0000 mL | INTRAVENOUS | Status: DC | PRN

## 2018-10-02 MED ORDER — FENTANYL CITRATE (PF) 250 MCG/5ML IJ SOLN
INTRAMUSCULAR | Status: AC
  Filled 2018-10-02: qty 5

## 2018-10-02 MED ORDER — ALUM & MAG HYDROXIDE-SIMETH 200-200-20 MG/5ML PO SUSP: 30.0000 mL | Freq: Four times a day (QID) | ORAL | Status: DC | PRN

## 2018-10-02 MED ORDER — CEFAZOLIN SODIUM-DEXTROSE 2-4 GM/100ML-% IV SOLN
INTRAVENOUS | Status: AC
  Filled 2018-10-02: qty 100

## 2018-10-02 MED ORDER — THROMBIN 5000 UNITS EX SOLR
CUTANEOUS | Status: DC | PRN
  Administered 2018-10-02 (×2): 5000 [IU] via TOPICAL

## 2018-10-02 MED ORDER — ONDANSETRON HCL 4 MG/2ML IJ SOLN: 4.0000 mg | Freq: Four times a day (QID) | INTRAMUSCULAR | Status: DC | PRN

## 2018-10-02 MED ORDER — ONDANSETRON HCL 4 MG PO TABS: 4.0000 mg | ORAL_TABLET | Freq: Four times a day (QID) | ORAL | Status: DC | PRN

## 2018-10-02 MED ORDER — CYCLOBENZAPRINE HCL 10 MG PO TABS: 10.0000 mg | ORAL_TABLET | Freq: Three times a day (TID) | ORAL | Status: DC | PRN

## 2018-10-02 MED ORDER — SODIUM CHLORIDE 0.9 % IV SOLN
INTRAVENOUS | Status: DC | PRN
  Administered 2018-10-02: 500 mL

## 2018-10-02 MED ORDER — ACETAMINOPHEN 650 MG RE SUPP: 650.0000 mg | RECTAL | Status: DC | PRN

## 2018-10-02 MED ORDER — FENTANYL CITRATE (PF) 100 MCG/2ML IJ SOLN
INTRAMUSCULAR | Status: AC
  Filled 2018-10-02: qty 2

## 2018-10-02 MED ORDER — ACETAMINOPHEN 325 MG PO TABS: 650.0000 mg | ORAL_TABLET | ORAL | Status: DC | PRN

## 2018-10-02 MED ORDER — PHENOL 1.4 % MT LIQD: 1.0000 | OROMUCOSAL | Status: DC | PRN

## 2018-10-02 MED ORDER — ROCURONIUM BROMIDE 50 MG/5ML IV SOSY
PREFILLED_SYRINGE | INTRAVENOUS | Status: AC
  Filled 2018-10-02: qty 10

## 2018-10-02 MED ORDER — PANTOPRAZOLE SODIUM 40 MG IV SOLR: 40.0000 mg | Freq: Every day | INTRAVENOUS | Status: DC

## 2018-10-02 MED ORDER — PROMETHAZINE HCL 25 MG/ML IJ SOLN: 6.2500 mg | INTRAMUSCULAR | Status: DC | PRN

## 2018-10-02 MED ORDER — THROMBIN 5000 UNITS EX SOLR
OROMUCOSAL | Status: DC | PRN
  Administered 2018-10-02: 10:00:00

## 2018-10-02 MED ORDER — LIDOCAINE 2% (20 MG/ML) 5 ML SYRINGE
INTRAMUSCULAR | Status: DC | PRN
  Administered 2018-10-02: 60 mg via INTRAVENOUS

## 2018-10-02 MED ORDER — CEFAZOLIN SODIUM-DEXTROSE 2-4 GM/100ML-% IV SOLN
2.0000 g | Freq: Three times a day (TID) | INTRAVENOUS | Status: AC
  Administered 2018-10-02 – 2018-10-03 (×2): 2 g via INTRAVENOUS
  Filled 2018-10-02 (×2): qty 100

## 2018-10-02 MED ORDER — HEMOSTATIC AGENTS (NO CHARGE) OPTIME
TOPICAL | Status: DC | PRN
  Administered 2018-10-02: 1

## 2018-10-02 SURGICAL SUPPLY — 60 items
ADH SKN CLS APL DERMABOND .7 (GAUZE/BANDAGES/DRESSINGS) ×1
APL SKNCLS STERI-STRIP NONHPOA (GAUZE/BANDAGES/DRESSINGS) ×1
BAG DECANTER FOR FLEXI CONT (MISCELLANEOUS) ×2 IMPLANT
BASKET BONE COLLECTION (BASKET) ×2 IMPLANT
BENZOIN TINCTURE PRP APPL 2/3 (GAUZE/BANDAGES/DRESSINGS) ×2 IMPLANT
BIT DRILL NEURO 2X3.1 SFT TUCH (MISCELLANEOUS) ×1 IMPLANT
BONE VIVIGEN FORMABLE 1.3CC (Bone Implant) ×2 IMPLANT
BUR MATCHSTICK NEURO 3.0 LAGG (BURR) ×2 IMPLANT
CANISTER SUCT 3000ML PPV (MISCELLANEOUS) ×2 IMPLANT
CARTRIDGE OIL MAESTRO DRILL (MISCELLANEOUS) ×1 IMPLANT
COVER WAND RF STERILE (DRAPES) IMPLANT
DERMABOND ADVANCED (GAUZE/BANDAGES/DRESSINGS) ×1
DERMABOND ADVANCED .7 DNX12 (GAUZE/BANDAGES/DRESSINGS) ×1 IMPLANT
DIFFUSER DRILL AIR PNEUMATIC (MISCELLANEOUS) ×2 IMPLANT
DRAPE C-ARM 42X72 X-RAY (DRAPES) ×4 IMPLANT
DRAPE LAPAROTOMY 100X72 PEDS (DRAPES) ×2 IMPLANT
DRAPE MICROSCOPE LEICA (MISCELLANEOUS) ×2 IMPLANT
DRILL NEURO 2X3.1 SOFT TOUCH (MISCELLANEOUS) ×2
DRSG OPSITE POSTOP 4X6 (GAUZE/BANDAGES/DRESSINGS) ×2 IMPLANT
DURAPREP 6ML APPLICATOR 50/CS (WOUND CARE) ×2 IMPLANT
ELECT COATED BLADE 2.86 ST (ELECTRODE) ×2 IMPLANT
ELECT REM PT RETURN 9FT ADLT (ELECTROSURGICAL) ×2
ELECTRODE REM PT RTRN 9FT ADLT (ELECTROSURGICAL) ×1 IMPLANT
GAUZE 4X4 16PLY RFD (DISPOSABLE) IMPLANT
GAUZE SPONGE 4X4 12PLY STRL (GAUZE/BANDAGES/DRESSINGS) IMPLANT
GLOVE BIO SURGEON STRL SZ7 (GLOVE) ×4 IMPLANT
GLOVE BIO SURGEON STRL SZ8 (GLOVE) ×2 IMPLANT
GLOVE BIOGEL PI IND STRL 7.0 (GLOVE) ×2 IMPLANT
GLOVE BIOGEL PI IND STRL 7.5 (GLOVE) ×6 IMPLANT
GLOVE BIOGEL PI INDICATOR 7.0 (GLOVE) ×2
GLOVE BIOGEL PI INDICATOR 7.5 (GLOVE) ×6
GLOVE INDICATOR 8.5 STRL (GLOVE) ×2 IMPLANT
GOWN STRL REUS W/ TWL LRG LVL3 (GOWN DISPOSABLE) ×3 IMPLANT
GOWN STRL REUS W/ TWL XL LVL3 (GOWN DISPOSABLE) ×1 IMPLANT
GOWN STRL REUS W/TWL LRG LVL3 (GOWN DISPOSABLE) ×6
GOWN STRL REUS W/TWL XL LVL3 (GOWN DISPOSABLE) ×2
HALTER HD/CHIN CERV TRACTION D (MISCELLANEOUS) ×2 IMPLANT
HEMOSTAT POWDER KIT SURGIFOAM (HEMOSTASIS) ×2 IMPLANT
KIT BASIN OR (CUSTOM PROCEDURE TRAY) ×2 IMPLANT
KIT TURNOVER KIT B (KITS) ×2 IMPLANT
NEEDLE HYPO 18GX1.5 BLUNT FILL (NEEDLE) ×2 IMPLANT
NEEDLE SPNL 20GX3.5 QUINCKE YW (NEEDLE) ×2 IMPLANT
NS IRRIG 1000ML POUR BTL (IV SOLUTION) ×2 IMPLANT
OIL CARTRIDGE MAESTRO DRILL (MISCELLANEOUS) ×2
PACK LAMINECTOMY NEURO (CUSTOM PROCEDURE TRAY) ×2 IMPLANT
PAD ARMBOARD 7.5X6 YLW CONV (MISCELLANEOUS) ×6 IMPLANT
PIN DISTRACTION 14MM (PIN) ×4 IMPLANT
PLATE ANT CERV XTEND 1 LV 14 (Plate) ×2 IMPLANT
RUBBERBAND STERILE (MISCELLANEOUS) ×4 IMPLANT
SCREW VAR 4.2 XD SELF DRILL 14 (Screw) ×8 IMPLANT
SPACER ACDF TPS LG PARALLEL 7 (Spacer) ×2 IMPLANT
SPONGE INTESTINAL PEANUT (DISPOSABLE) ×2 IMPLANT
SPONGE SURGIFOAM ABS GEL SZ50 (HEMOSTASIS) ×2 IMPLANT
STRIP CLOSURE SKIN 1/2X4 (GAUZE/BANDAGES/DRESSINGS) ×2 IMPLANT
SUT VIC AB 3-0 SH 8-18 (SUTURE) ×2 IMPLANT
SUT VICRYL 4-0 PS2 18IN ABS (SUTURE) ×2 IMPLANT
TAPE CLOTH 4X10 WHT NS (GAUZE/BANDAGES/DRESSINGS) ×2 IMPLANT
TOWEL GREEN STERILE (TOWEL DISPOSABLE) ×2 IMPLANT
TOWEL GREEN STERILE FF (TOWEL DISPOSABLE) ×2 IMPLANT
WATER STERILE IRR 1000ML POUR (IV SOLUTION) ×2 IMPLANT

## 2018-10-02 NOTE — Progress Notes (Signed)
Orthopedic Tech Progress Note Patient Details:  Meghan Park May 14, 1971 [id number]  Ortho Devices Type of Ortho Device: Soft collar Ortho Device/Splint Interventions: Adjustment, Application, Ordered   Post Interventions Patient Tolerated: Well Instructions Provided: Adjustment of device, Care of device   Doneshia Hill J Lalania Haseman 10/02/2018, 12:10 PM

## 2018-10-02 NOTE — Progress Notes (Signed)
Subjective: Patient reports numbness tingling weakness in her hands and difficulty walking  Objective: Vital signs in last 24 hours: Temp:  [97.5 F (36.4 C)-97.8 F (36.6 C)] 97.7 F (36.5 C) (03/09 0807) Pulse Rate:  [70-83] 74 (03/09 0807) Resp:  [18-20] 20 (03/09 0807) BP: (101-132)/(62-81) 117/71 (03/09 0807) SpO2:  [97 %-99 %] 99 % (03/09 0807)  Intake/Output from previous day: 03/08 0701 - 03/09 0700 In: 3 [I.V.:3] Out: -  Intake/Output this shift: No intake/output data recorded.  strength 4 out of 5 for placenta 5 grip strength 4+ out of 5 triceps lower extremities 5 out of 5  Lab Results: Recent Labs    10/01/18 1000  WBC 10.6*  HGB 13.4  HCT 40.2  PLT 214   BMET Recent Labs    10/01/18 1000 10/01/18 1319  NA 130*  --   K 5.3*  --   CL 98  --   CO2 19*  --   GLUCOSE 409* 263*  BUN 29*  --   CREATININE 0.88  --   CALCIUM 9.1  --     Studies/Results: No results found.  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 Due to patient's progression of clinical syndrome imaging findings and failure conservative treatment I recommended anterior cervical discectomy and fusion at C3-4 I've extensively gone over the risks and benefits of the operation with her as well as perioperative course expectations of outcome and alternatives of surgery and she understood and agreed to proceed forward.  LOS: 4 days     Amiel Sharrow P 10/02/2018, 9:10 AM

## 2018-10-02 NOTE — Progress Notes (Signed)
PT Cancellation Note  Patient Details Name: Meghan Park MRN: [id number] DOB: 1971-04-24   Cancelled Treatment:    Reason Eval/Treat Not Completed: Patient at procedure or test/unavailable   Tristian Bouska B Kasra Melvin 10/02/2018, 8:35 AM  Delaney Meigs, PT Acute Rehabilitation Services Pager: [phone] Office: [phone]

## 2018-10-02 NOTE — Anesthesia Procedure Notes (Signed)
Procedure Name: Intubation Date/Time: 10/02/2018 9:39 AM Performed by: Tillman Abide, CRNA Pre-anesthesia Checklist: Patient identified, Emergency Drugs available, Suction available and Patient being monitored Patient Re-evaluated:Patient Re-evaluated prior to induction Oxygen Delivery Method: Circle System Utilized Preoxygenation: Pre-oxygenation with 100% oxygen Induction Type: IV induction Laryngoscope Size: Miller and 2 Grade View: Grade I Tube type: Oral Tube size: 7.5 mm Number of attempts: 1 Airway Equipment and Method: Stylet Placement Confirmation: ETT inserted through vocal cords under direct vision,  positive ETCO2 and breath sounds checked- equal and bilateral Secured at: 21 cm Tube secured with: Tape Dental Injury: Teeth and Oropharynx as per pre-operative assessment

## 2018-10-02 NOTE — Progress Notes (Addendum)
RN verified the presence of a signed informed consent that matches stated procedure by patient. Verified armband matches patient's stated name and birth date. Verified NPO status and that all jewelry, contact, glasses, dentures, and partials had been removed (if applicable).   Per floor RN CBG was 225 and they are docking CBG meter.

## 2018-10-02 NOTE — Progress Notes (Signed)
Inpatient Diabetes Program Recommendations  AACE/ADA: New Consensus Statement on Inpatient Glycemic Control (2015)  Target Ranges:  Prepandial:   less than 140 mg/dL      Peak postprandial:   less than 180 mg/dL (1-2 hours)      Critically ill patients:  140 - 180 mg/dL   Lab Results  Component Value Date   GLUCAP 180 (H) 10/02/2018   HGBA1C 10.2 (H) 09/28/2018    Review of Glycemic Control Results for JANNY, SPENGLER (MRN [id number]) as of 10/02/2018 15:29  Ref. Range 10/01/2018 12:01 10/01/2018 18:35 10/01/2018 21:17 10/02/2018 08:06 10/02/2018 11:22  Glucose-Capillary Latest Ref Range: 70 - 99 mg/dL 615 (H) 183 (H) 437 (H) 225 (H) 180 (H)   Diabetes history: Type 2 DM Outpatient Diabetes medications: Humalog 25 units TID, Tresiba 50 units QHS Current orders for Inpatient glycemic control: Novolog 0-15 units TID  Inpatient Diabetes Program Recommendations:    Noted consult for DM.  Patient has been receiving decadron 10 mg Q6H, thus further contributing to increased glucose trends.   Consider adding a portion of basal back and add meal coverage- Lantus 18 units QHS and Novolog 6 units TID (assuming that patient is consuming >50%).  Patient just left PACU, will plan to see on 3/10.   Thanks, Lujean Rave, MSN, RNC-OB Diabetes Coordinator [phone] (8a-5p)  Additionally,

## 2018-10-02 NOTE — Transfer of Care (Signed)
Immediate Anesthesia Transfer of Care Note  Patient: Meghan Park  Procedure(s) Performed: ANTERIOR CERVICAL DECOMPRESSION/DISCECTOMY FUSION  C3-4 1 LEVEL (N/A Spine Cervical)  Patient Location: PACU  Anesthesia Type:General  Level of Consciousness: awake, alert , oriented and patient cooperative  Airway & Oxygen Therapy: Patient Spontanous Breathing  Post-op Assessment: Report given to RN and Post -op Vital signs reviewed and stable  Post vital signs: Reviewed and stable  Last Vitals:  Vitals Value Taken Time  BP    Temp    Pulse    Resp    SpO2      Last Pain:  Vitals:   10/02/18 0807  TempSrc: Oral  PainSc:       Patients Stated Pain Goal: 3 (10/02/18 0800)  Complications: No apparent anesthesia complications

## 2018-10-02 NOTE — Progress Notes (Signed)
OT Cancellation Note  Patient Details Name: Meghan Park MRN: [id number] DOB: [birth year]   Cancelled Treatment:    Reason Eval/Treat Not Completed: Other (comment). Pt for surgery today, will re-evaluate post surgery.   Ignacia Palma, OTR/L Acute Rehab Services Pager [phone] Office [phone]     Meghan Park 10/02/2018, 8:37 AM

## 2018-10-02 NOTE — Anesthesia Postprocedure Evaluation (Signed)
Anesthesia Post Note  Patient: Meghan Park  Procedure(s) Performed: ANTERIOR CERVICAL DECOMPRESSION/DISCECTOMY FUSION  C3-4 1 LEVEL (N/A Spine Cervical)     Patient location during evaluation: PACU Anesthesia Type: General Level of consciousness: awake and alert Pain management: pain level controlled Vital Signs Assessment: post-procedure vital signs reviewed and stable Respiratory status: spontaneous breathing, nonlabored ventilation, respiratory function stable and patient connected to nasal cannula oxygen Cardiovascular status: blood pressure returned to baseline and stable Postop Assessment: no apparent nausea or vomiting Anesthetic complications: no    Last Vitals:  Vitals:   10/02/18 1221 10/02/18 1245  BP: 120/80 128/80  Pulse: 84 84  Resp: 11 20  Temp: (!) 36.4 C 36.9 C  SpO2: 93% 97%    Last Pain:  Vitals:   10/02/18 1400  TempSrc:   PainSc: 6                  Kennieth Rad

## 2018-10-02 NOTE — Op Note (Signed)
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  EBL: Minimal  History of present illness:48 year old female who presented with progressive quadriparesis difficulty ambulating weakness in her hands. Workup revealed severe cervical stenosis cord compression signal change within the cord at C3-4. Patient presented on Plavix was admitted hospital placed and IV steroids held up Plavix and some splaying recommended taking the OR for anterior cervical discectomy and fusion at that level. I extensively went over the risks and benefits of the procedure with her as well as perioperative course expectations of outcome and alternatives of surgery and she understood and agreed to proceed forward.  Operative procedure: Patient brought into the or was induced on general anesthesia positioned supine the neck in slight extension in 5 pounds of halter traction the left side of her neck was prepped and draped in routine sterile fashion and after preoperative x-ray localize the appropriate level a curvilinear incision was made just off midline to the interbody of the sternocleidomastoid and the superficial at this was dissected and divided longitudinally the avascular plane between the sternomastoid and strap as was developed down to the previous fascia and previous fascia was dissected away with Kitners. Interoperative x-ray confirmed of complication appropriate level so annulotomy was made with a 15 blade scalpel marked the disc space self-retaining retractor was placed after lungs close reflected laterally and disc space was  further incised and cleanout pituitary rongeurs and upgoing curettes. Disc space was then further drilled down the posterior annulus and possibly complex capturing the bone shavings in a mucous trap. Under microscopic illumination further drilling down to the posterior ossific complexes and aggressive under biting of both endplates allowed identification of posterior longitudinal ligament was was removed in piecemeal fashion. Marching laterally both C4 pedicles were identified both C4 nerve roots decompressed flush with pedicle there was a lot of soft disc material as well as a spondylitic bar coming off C4 that was aggressively under been decompress and central canal. At the end the discectomy was no further stenosis either foraminally or centrally I sized a 7 mm parallel TPS coated peek cage packed with the locally harvested autograft mix and inserted a 1 mm deep to the anterior vertebral body line. I then selected a 14 mm globus extend plate all screws excellent purchase locking mechanisms were engaged wounds and go see her get meticulous in space was maintained some additional bone chips were packed in and around the plate and then the wounds closed in layers with after Vicryl running 4 subcuticular Dermabond benzo and Steri-Strips and a sterile dressing was applied and patient recovered in stable condition. At the end the case all needle counts sponge counts were correct.

## 2018-10-03 LAB — GLUCOSE, CAPILLARY
Glucose-Capillary: 284 mg/dL — ABNORMAL HIGH (ref 70–99)
Glucose-Capillary: 347 mg/dL — ABNORMAL HIGH (ref 70–99)

## 2018-10-03 NOTE — TOC Transition Note (Addendum)
Transition of Care [facility]) - CM/SW Discharge Note   Patient Details  Name: EILISH CHEY MRN: [id number] Date of Birth: [birth year]  [facility] CM/SW Contact:  Glennon Mac, RN Phone Number: 10/03/2018, 2:38 PM   Clinical Narrative:    Pt discharging home today; HHPT recommended and ordered, but pt refused home health services.  Pt uninsured, but denies need for medication assistance.     Final next level of care: Home/Self Care Barriers to Discharge: No Barriers Identified   Patient Goals and CMS Choice        Discharge Placement                       Discharge Plan and Services Discharge Planning Services: CM Consult                HH Arranged: Patient Refused HH     Social Determinants of Health (SDOH) Interventions     Readmission Risk Interventions No flowsheet data found.   Quintella Baton, RN, BSN  Trauma/Neuro ICU Case Manager [phone]

## 2018-10-03 NOTE — Progress Notes (Signed)
Inpatient Diabetes Program Recommendations  AACE/ADA: New Consensus Statement on Inpatient Glycemic Control (2015)  Target Ranges:  Prepandial:   less than 140 mg/dL      Peak postprandial:   less than 180 mg/dL (1-2 hours)      Critically ill patients:  140 - 180 mg/dL   Lab Results  Component Value Date   GLUCAP 284 (H) 10/03/2018   HGBA1C 10.2 (H) 09/28/2018    Review of Glycemic Control Results for Meghan Park, Meghan Park (MRN [id number]) as of 10/03/2018 10:55  Ref. Range 10/02/2018 11:22 10/02/2018 16:20 10/02/2018 21:24 10/03/2018 07:50  Glucose-Capillary Latest Ref Range: 70 - 99 mg/dL 932 (H) 355 (H) 732 (H) 284 (H)   Diabetes history: Type 2 DM Outpatient Diabetes medications: Humalog 25 units TID Current orders for Inpatient glycemic control: Novolog 0-15 units TID  Inpatient Diabetes Program Recommendations:     Patient has been receiving decadron 10 mg Q6H, thus further contributing to increased glucose trends.   Consider adding a portion of basal back and add meal coverage- Lantus 18 units QHS and Novolog 6 units TID (assuming that patient is consuming >50%).  Spoke with patient regarding outpatient DM management. Patient verifies taking Novolog 25 units TID, however has not taken [place] in over a year due to affordability. Patient sees a clinic in [facility] where she gets Novolog. Has a plan for follow up next week.  Reviewed patient's current A1c of 10.2%. Explained what a A1c is and what it measures. Also reviewed goal A1c with patient, importance of good glucose control @ home, and blood sugar goals. Reviewed patho of DM, need for insulin, discussed differences between long acting and short acting insulin, Relion products, options for obtaining insulin, when to call MD, vascular changes, impact of glucose and risk for infection and other co-morbidies. Patient recently moved and lost her meter. Informed of Relion products. Patient plans to purchase and begin checking. Encouraged  CBGs 2 times per day and stressed the importance of follow up. Patient denies having low glucose symptoms. Reviewed questions to ask PCP at follow up and reviewed target glucose goals. Patient denies further questions at this time.   Thanks, Lujean Rave, MSN, RNC-OB Diabetes Coordinator [phone] (8a-5p)

## 2018-10-03 NOTE — Progress Notes (Signed)
Physical Therapy Treatment Patient Details Name: Meghan Park MRN: [id number] DOB: Dec 15, 1970 Today's Date: 10/03/2018    History of Present Illness 48 yo female admitted for preop management s/p ACDF 10/02/18. Pt with history of 2 weeks of tingling and numbness in arms and hands and multiple falls, ambulated with RW with recent transition to [facility] due to weakness with C3-4cord compression. PMH: ACDF C5-7, anxiety, depression, arthritis, type 2 DM, HTN, fibromyalgia, neuropathy    PT Comments    Pt sitting EOB upon arrival eager to participate with therapy stating she was feeling much better. Pt presents with improved strength in UE and LEs. Pt progressed ambulation distance and tolerance today. Pt educated and provided handout for cervical precautions and performing daily activities within precautions and verbalized understanding.   Follow Up Recommendations  Home health PT;Supervision for mobility/OOB     Equipment Recommendations  None recommended by PT    Recommendations for Other Services       Precautions / Restrictions Precautions Precautions: Fall Precaution Booklet Issued: Yes (comment) Precaution Comments: 4 recent falls Required Braces or Orthoses: Cervical Brace(orders for brace and no brace needed. Pt reports MD instructed no brace needed) Cervical Brace: Soft collar;For comfort    Mobility  Bed Mobility               General bed mobility comments: EOB on arrival  Transfers Overall transfer level: Modified independent                  Ambulation/Gait Ambulation/Gait assistance: Modified independent (Device/Increase time) Gait Distance (Feet): 110 Feet Assistive device: Rolling walker (2 wheeled) Gait Pattern/deviations: Trunk flexed;Step-through pattern;Decreased stride length   Gait velocity interpretation: <1.8 ft/sec, indicate of risk for recurrent falls General Gait Details: slow cautious gait and reliant on Rw but steady and no need for chair  follow today   Stairs             Wheelchair Mobility    Modified Rankin (Stroke Patients Only)       Balance Overall balance assessment: History of Falls(pt able to maintain sitting balance EOB without UE support while donning socks)                                          Cognition Arousal/Alertness: Awake/alert Behavior During Therapy: WFL for tasks assessed/performed Overall Cognitive Status: Within Functional Limits for tasks assessed                                        Exercises      General Comments        Pertinent Vitals/Pain Pain Score: 4  Pain Location: neck and throat Pain Descriptors / Indicators: Aching;Pressure;Sore Pain Intervention(s): Limited activity within patient's tolerance;Premedicated before session;Monitored during session;Repositioned    Home Living                      Prior Function            PT Goals (current goals can now be found in the care plan section) Progress towards PT goals: Progressing toward goals    Frequency           PT Plan Current plan remains appropriate    Co-evaluation  AM-PAC PT "6 Clicks" Mobility   Outcome Measure  Help needed turning from your back to your side while in a flat bed without using bedrails?: A Little Help needed moving from lying on your back to sitting on the side of a flat bed without using bedrails?: A Little Help needed moving to and from a bed to a chair (including a wheelchair)?: None Help needed standing up from a chair using your arms (e.g., wheelchair or bedside chair)?: None Help needed to walk in hospital room?: None Help needed climbing 3-5 steps with a railing? : A Little 6 Click Score: 21    End of Session Equipment Utilized During Treatment: Gait belt Activity Tolerance: Patient tolerated treatment well Patient left: in chair;with call bell/phone within reach Nurse Communication: Mobility  status PT Visit Diagnosis: Other abnormalities of gait and mobility (R26.89);Muscle weakness (generalized) (M62.81);History of falling (Z91.81)     Time: 9417-4081 PT Time Calculation (min) (ACUTE ONLY): 16 min  Charges:  $Gait Training: 8-22 mins                     Meghan Park, [place] [phone]    Meghan Park 10/03/2018, 8:15 AM

## 2018-10-03 NOTE — Progress Notes (Signed)
Occupational Therapy Treatment and Discharge Patient Details Name: Meghan Park MRN: [id number] DOB: [birth year] Today's Date: 10/03/2018    History of present illness 48 yo female admitted for preop management s/p ACDF 10/02/18. Pt with history of 2 weeks of tingling and numbness in arms and hands and multiple falls, ambulated with RW with recent transition to [facility] due to weakness with C3-4cord compression. PMH: ACDF C5-7, anxiety, depression, arthritis, type 2 DM, HTN, fibromyalgia, neuropathy   OT comments  This 48 yo female admitted and underwent above presents to acute OT with all education completed and pt without any further questions, no further OT needs we will sign off.  Follow Up Recommendations  No OT follow up;Supervision - Intermittent    Equipment Recommendations  None recommended by OT       Precautions / Restrictions Precautions Precautions: Fall Precaution Booklet Issued: Yes (comment) Precaution Comments: 4 recent falls Required Braces or Orthoses: Cervical Brace Cervical Brace: Soft collar;For comfort Restrictions Weight Bearing Restrictions: No       Mobility Bed Mobility Overal bed mobility: Modified Independent             General bed mobility comments: HOB flat and no rail  Transfers Overall transfer level: Needs assistance Equipment used: Rolling walker (2 wheeled) Transfers: Sit to/from Stand Sit to Stand: Supervision              Balance Overall balance assessment: History of Falls;Mild deficits observed, not formally tested                                         ADL either performed or assessed with clinical judgement   ADL Overall ADL's : Needs assistance/impaired Eating/Feeding: Independent Eating/Feeding Details (indicate cue type and reason): knows to not tip head back for drinking--does not want to use straws due to gastroparesis   Grooming Details (indicate cue type and reason): Pt aware she needs to use 2  cups for brushing teeth (one to spit into and one rinse) Upper Body Bathing: Supervision/ safety;Set up;Sitting   Lower Body Bathing: Set up;Supervison/ safety;Sit to/from stand   Upper Body Dressing : Set up;Supervision/safety;Sitting Upper Body Dressing Details (indicate cue type and reason): Pt aware that button up shirts easiest, if wearing pull over shirts then need to do head first, left arm next due to weaker, and then right arm; taking off left arm, right arm, then head Lower Body Dressing: Set up;Supervision/safety;Sit to/from stand   Toilet Transfer: Ambulation;RW;Comfort height toilet;Grab bars;Supervision/safety   Toileting- Clothing Manipulation and Hygiene: Sit to/from Oncologist Details (indicate cue type and reason): Pt has tub/shower with grab bars and tub seat         Vision Baseline Vision/History: No visual deficits Patient Visual Report: No change from baseline Vision Assessment?: No apparent visual deficits          Cognition Arousal/Alertness: Awake/alert Behavior During Therapy: WFL for tasks assessed/performed Overall Cognitive Status: Within Functional Limits for tasks assessed                                                     Pertinent Vitals/ Pain       Pain Assessment: 0-10 Pain Score: 7  Pain Location: neck and throat Pain Descriptors / Indicators: Aching;Pressure;Sore Pain Intervention(s): Limited activity within patient's tolerance;Monitored during session;RN gave pain meds during session         Frequency  Min 2X/week        Progress Toward Goals  OT Goals(current goals can now be found in the care plan section)  Progress towards OT goals: (All education completed)     Plan Discharge plan remains appropriate       AM-PAC OT "6 Clicks" Daily Activity     Outcome Measure   Help from another person eating meals?: None Help from another person taking care of  personal grooming?: A Little Help from another person toileting, which includes using toliet, bedpan, or urinal?: A Little Help from another person bathing (including washing, rinsing, drying)?: A Little Help from another person to put on and taking off regular upper body clothing?: A Little Help from another person to put on and taking off regular lower body clothing?: A Little 6 Click Score: 19    End of Session Equipment Utilized During Treatment: Gait belt;Rolling walker  OT Visit Diagnosis: Unsteadiness on feet (R26.81);Repeated falls (R29.6);Muscle weakness (generalized) (M62.81);Pain Pain - part of body: (neck)   Activity Tolerance Patient tolerated treatment well   Patient Left in bed;with call bell/phone within reach;with family/visitor present   Nurse Communication (pt's foot with bandage coming off)        Time: 6767-2094 OT Time Calculation (min): 22 min  Charges: OT General Charges $OT Visit: 1 Visit OT Treatments $Self Care/Home Management : 8-22 mins  Ignacia Palma, OTR/L Acute [employer] Pager [phone] Office [phone]      Evette Georges 10/03/2018, 1:01 PM

## 2018-10-03 NOTE — Progress Notes (Signed)
Subjective: Patient reports she don't great significant improvement in numbness tingling in dexterity in her hands as well as ambulation  Objective: Vital signs in last 24 hours: Temp:  [97.5 F (36.4 C)-98.4 F (36.9 C)] 98.1 F (36.7 C) (03/09 1951) Pulse Rate:  [74-96] 83 (03/09 1951) Resp:  [11-20] 19 (03/09 1951) BP: (117-138)/(71-85) 127/82 (03/09 1951) SpO2:  [92 %-99 %] 92 % (03/09 1951)  Intake/Output from previous day: 03/09 0701 - 03/10 0700 In: 440 [P.O.:240] Out: 50 [Blood:50] Intake/Output this shift: No intake/output data recorded.  strength improved for placenta 5 bilateral grip strength 5 out of 5 otherwis  Lab Results: Recent Labs    10/01/18 1000  WBC 10.6*  HGB 13.4  HCT 40.2  PLT 214   BMET Recent Labs    10/01/18 1000 10/01/18 1319  NA 130*  --   K 5.3*  --   CL 98  --   CO2 19*  --   GLUCOSE 409* 263*  BUN 29*  --   CREATININE 0.88  --   CALCIUM 9.1  --     Studies/Results: Dg Cervical Spine 1 View  Result Date: 10/02/2018 CLINICAL DATA:  Cervical fusion EXAM: DG CERVICAL SPINE - 1 VIEW COMPARISON:  None. FLUOROSCOPY TIME:  Radiation Exposure Index (as provided by the fluoroscopic device): Not available If the device does not provide the exposure index: Fluoroscopy Time:  3 seconds Number of Acquired Images:  1 FINDINGS: Cervical fusion is noted at C3-4. Changes of prior fusion at C5-6 are seen. IMPRESSION: Cervical fusion as described. Electronically Signed   By: Alcide Clever M.D.   On: 10/02/2018 14:27   Dg C-arm 1-60 Min  Result Date: 10/02/2018 CLINICAL DATA:  48 year old female for C3-4 fusion. Subsequent encounter. EXAM: DG C-ARM 61-120 MIN Fluoroscopic time: 3 seconds. COMPARISON:  09/27/2018 cervical spine MR. FINDINGS: Single intraoperative C-arm views submitted for review after surgery. This reveals C3-4 discectomy and fusion with anterior plate and screws in place. Indistinctness of prevertebral fat planes and therefore can not  adequately assess for prevertebral/retropharyngeal soft tissue abnormality. Remote fusion lower cervical spine with upper aspect to C5 level visualized (below this region evaluation limited by overlying shoulders). IMPRESSION: Post recent fusion C3-4 as noted above. Electronically Signed   By: Lacy Duverney M.D.   On: 10/02/2018 14:03    Assessment/Plan: Postop day 1 and ACDF doing very well with improvement of her myelopathy continue physical therapy this morning when cleared by physical therapy patient can be discharged this afternoon.  LOS: 5 days     Meghan Park P 10/03/2018, 6:45 AM

## 2018-10-03 NOTE — Discharge Summary (Signed)
Physician Discharge Summary  Patient ID: Meghan Park MRN: [id number] DOB/AGE: [birth year] 48 y.o. Estimated body mass index is 33.84 kg/m as calculated from the following:   Height as of this encounter: 5' 2.5" (1.588 m).   Weight as of this encounter: 85.3 kg.   Admit date: 09/28/2018 Discharge date: 10/03/2018  Admission Diagnoses: cervical spondylitic myelopathy  Discharge Diagnoses:  same Active Problems:   Myelopathy [facility])   Discharged Condition: good  Hospital Course:  Patient was admitted on transfer with a progressive quad paresis from a myelopathy with cord compression at C3-4.  Patient was on Plavix this was discontinued and she was observed and taken to the operating room 5 days later.  Postoperatively patient did very well was ambulating and voiding and stable for discharge home patient should resume her Plavix on Thursday.  Consults: Significant Diagnostic Studies: Treatments:  ACDF C3-4 Discharge Exam: Blood pressure 114/80, pulse 87, temperature 98.2 F (36.8 C), temperature source Oral, resp. rate 20, height 5' 2.5" (1.588 m), weight 85.3 kg, SpO2 98 %.  strength 5/5 1 clean dry and intact  Disposition:  home  Discharge Instructions    Face-to-face encounter (required for Medicare/Medicaid patients)   Complete by:  As directed    I Kirk Sampley P certify that this patient is under my care and that I, or a nurse practitioner or physician's assistant working with me, had a face-to-face encounter that meets the physician face-to-face encounter requirements with this patient on 10/03/2018. The encounter with the patient was in whole, or in part for the following medical condition(s) which is the primary reason for home health care (List medical condition): myelopathy   The encounter with the patient was in whole, or in part, for the following medical condition, which is the primary reason for home health care:  myelopathy   I certify that, based on my findings, the  following services are medically necessary home health services:  Physical therapy   Reason for Medically Necessary Home Health Services:  Therapy- Therapeutic Exercises to Increase Strength and Endurance   My clinical findings support the need for the above services:  Pain interferes with ambulation/mobility   Further, I certify that my clinical findings support that this patient is homebound due to:  Pain interferes with ambulation/mobility   Home Health   Complete by:  As directed    To provide the following care/treatments:  PT     Allergies as of 10/03/2018      Reactions   Buprenorphine Hcl Hives, Dermatitis   Severe itching leading to welts and open wounds.   Januvia [sitagliptin] Other (See Comments)   GI upset   Morphine And Related Dermatitis, Other (See Comments)   Severe itching leading to welts and open wounds.   Phenergan [promethazine Hcl] Other (See Comments)   TD   Promethazine Other (See Comments)   TD   Reglan [metoclopramide] Other (See Comments)   Tremors, jittery,headache   Farxiga [dapagliflozin] Other (See Comments)   yeast   Aspirin Nausea And Vomiting   Per Dr. Ruben Gottron GI specialist at [facility] has advised against d/t gastroparesis      Medication List    STOP taking these medications   clonazePAM 1 MG tablet Commonly known as:  KLONOPIN   cloNIDine 0.1 MG tablet Commonly known as:  Catapres   cyanocobalamin 2000 MCG tablet   insulin degludec 100 UNIT/ML Sopn FlexTouch Pen Commonly known as:  Tyler Aas FlexTouch     TAKE these medications   atorvastatin  20 MG tablet Commonly known as:  LIPITOR Take 20 mg by mouth daily at 6 PM.   clopidogrel 75 MG tablet Commonly known as:  PLAVIX Take 75 mg by mouth daily.   cyclobenzaprine 10 MG tablet Commonly known as:  FLEXERIL Take 10 mg by mouth at bedtime.   gabapentin 100 MG capsule Commonly known as:  NEURONTIN Take 1 capsule (100 mg total) by mouth 3 (three) times daily.   glucose blood test  strip Use TID   insulin lispro 100 UNIT/ML KiwkPen Commonly known as:  HumaLOG KwikPen Inject 0.25 mLs (25 Units total) into the skin 3 (three) times daily.   ondansetron 8 MG disintegrating tablet Commonly known as:  ZOFRAN-ODT Take 8 mg by mouth every 6 (six) hours as needed for nausea or vomiting.   OneTouch Verio w/Device Kit 1 Act by Does not apply route 3 (three) times daily.   sertraline 100 MG tablet Commonly known as:  ZOLOFT Take 2 tablets (200 mg total) by mouth daily.   traZODone 150 MG tablet Commonly known as:  DESYREL Take by mouth at bedtime.        Signed: Mushka Laconte P 10/03/2018, 12:56 PM

## 2018-10-04 ENCOUNTER — Encounter (HOSPITAL_COMMUNITY): Payer: Self-pay | Admitting: Neurosurgery

## 2023-11-07 ENCOUNTER — Ambulatory Visit: Payer: Self-pay

## 2023-11-07 NOTE — Telephone Encounter (Signed)
 This patient called in with concerns today regarding her husband. A triage note was documented in her husband's chart addressing his symptoms.  Copied from CRM [id number]. Topic: General - Other >> Nov 07, 2023  2:29 PM Tiffany S wrote: Reason for CRM: Patient is requesting call back for medical advice please follow up with patient
